# Patient Record
Sex: Female | Born: 1950
Health system: Southern US, Community
[De-identification: ages and names within clinical notes are randomized; demographics above are authoritative.]

## PROBLEM LIST (undated history)

## (undated) DIAGNOSIS — J45909 Unspecified asthma, uncomplicated: Secondary | ICD-10-CM

## (undated) DIAGNOSIS — K922 Gastrointestinal hemorrhage, unspecified: Secondary | ICD-10-CM

## (undated) DIAGNOSIS — K219 Gastro-esophageal reflux disease without esophagitis: Secondary | ICD-10-CM

## (undated) DIAGNOSIS — G43909 Migraine, unspecified, not intractable, without status migrainosus: Secondary | ICD-10-CM

## (undated) DIAGNOSIS — Z853 Personal history of malignant neoplasm of breast: Secondary | ICD-10-CM

## (undated) DIAGNOSIS — D649 Anemia, unspecified: Secondary | ICD-10-CM

## (undated) DIAGNOSIS — K589 Irritable bowel syndrome without diarrhea: Secondary | ICD-10-CM

## (undated) HISTORY — DX: Gastrointestinal hemorrhage, unspecified: K92.2

## (undated) HISTORY — DX: Irritable bowel syndrome, unspecified: K58.9

## (undated) HISTORY — DX: Unspecified asthma, uncomplicated: J45.909

## (undated) HISTORY — DX: Migraine, unspecified, not intractable, without status migrainosus: G43.909

## (undated) HISTORY — PX: BREAST SURGERY: SHX581

## (undated) HISTORY — DX: Personal history of malignant neoplasm of breast: Z85.3

## (undated) HISTORY — DX: Gastro-esophageal reflux disease without esophagitis: K21.9

## (undated) HISTORY — PX: OTHER SURGICAL HISTORY: SHX169

---
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
## 1973-09-14 HISTORY — PX: TUBAL LIGATION: SHX77

## 1979-11-13 DIAGNOSIS — Z853 Personal history of malignant neoplasm of breast: Secondary | ICD-10-CM

## 1979-11-13 HISTORY — PX: ABDOMINAL HYSTERECTOMY: SHX81

## 1979-11-13 HISTORY — PX: MASTECTOMY: SHX3

## 1979-11-13 HISTORY — DX: Personal history of malignant neoplasm of breast: Z85.3

## 2000-06-05 ENCOUNTER — Emergency Department (HOSPITAL_COMMUNITY): Admission: EM | Admit: 2000-06-05 | Discharge: 2000-06-05 | Payer: Self-pay | Admitting: Emergency Medicine

## 2000-10-11 ENCOUNTER — Other Ambulatory Visit: Admission: RE | Admit: 2000-10-11 | Discharge: 2000-10-11 | Payer: Self-pay | Admitting: *Deleted

## 2001-12-13 ENCOUNTER — Emergency Department (HOSPITAL_COMMUNITY): Admission: EM | Admit: 2001-12-13 | Discharge: 2001-12-13 | Payer: Self-pay | Admitting: Emergency Medicine

## 2002-01-03 ENCOUNTER — Emergency Department (HOSPITAL_COMMUNITY): Admission: EM | Admit: 2002-01-03 | Discharge: 2002-01-04 | Payer: Self-pay | Admitting: Emergency Medicine

## 2002-08-25 ENCOUNTER — Other Ambulatory Visit: Admission: RE | Admit: 2002-08-25 | Discharge: 2002-08-25 | Payer: Self-pay | Admitting: Obstetrics and Gynecology

## 2002-09-26 ENCOUNTER — Encounter: Payer: Self-pay | Admitting: Obstetrics and Gynecology

## 2002-09-26 ENCOUNTER — Encounter: Admission: RE | Admit: 2002-09-26 | Discharge: 2002-09-26 | Payer: Self-pay | Admitting: Obstetrics and Gynecology

## 2003-06-12 ENCOUNTER — Encounter: Payer: Self-pay | Admitting: Neurology

## 2003-06-12 ENCOUNTER — Encounter: Admission: RE | Admit: 2003-06-12 | Discharge: 2003-06-12 | Payer: Self-pay | Admitting: Neurology

## 2003-12-31 ENCOUNTER — Encounter: Admission: RE | Admit: 2003-12-31 | Discharge: 2003-12-31 | Payer: Self-pay | Admitting: Obstetrics and Gynecology

## 2005-02-03 ENCOUNTER — Encounter: Admission: RE | Admit: 2005-02-03 | Discharge: 2005-02-03 | Payer: Self-pay | Admitting: Neurology

## 2005-08-20 ENCOUNTER — Emergency Department (HOSPITAL_COMMUNITY): Admission: EM | Admit: 2005-08-20 | Discharge: 2005-08-20 | Payer: Self-pay | Admitting: *Deleted

## 2005-12-14 ENCOUNTER — Encounter: Admission: RE | Admit: 2005-12-14 | Discharge: 2005-12-14 | Payer: Self-pay | Admitting: Neurology

## 2006-04-27 ENCOUNTER — Ambulatory Visit: Payer: Self-pay | Admitting: Internal Medicine

## 2006-06-23 ENCOUNTER — Emergency Department (HOSPITAL_COMMUNITY): Admission: EM | Admit: 2006-06-23 | Discharge: 2006-06-23 | Payer: Self-pay | Admitting: Emergency Medicine

## 2006-10-11 ENCOUNTER — Ambulatory Visit: Payer: Self-pay | Admitting: Internal Medicine

## 2008-08-10 ENCOUNTER — Ambulatory Visit: Payer: Self-pay | Admitting: Internal Medicine

## 2008-08-10 DIAGNOSIS — K589 Irritable bowel syndrome without diarrhea: Secondary | ICD-10-CM

## 2008-08-10 DIAGNOSIS — R519 Headache, unspecified: Secondary | ICD-10-CM | POA: Insufficient documentation

## 2008-08-10 DIAGNOSIS — J45909 Unspecified asthma, uncomplicated: Secondary | ICD-10-CM | POA: Insufficient documentation

## 2008-08-10 DIAGNOSIS — R51 Headache: Secondary | ICD-10-CM

## 2008-08-10 DIAGNOSIS — Z853 Personal history of malignant neoplasm of breast: Secondary | ICD-10-CM | POA: Insufficient documentation

## 2008-08-10 DIAGNOSIS — R1032 Left lower quadrant pain: Secondary | ICD-10-CM | POA: Insufficient documentation

## 2008-08-10 DIAGNOSIS — K219 Gastro-esophageal reflux disease without esophagitis: Secondary | ICD-10-CM

## 2008-08-10 DIAGNOSIS — G43909 Migraine, unspecified, not intractable, without status migrainosus: Secondary | ICD-10-CM | POA: Insufficient documentation

## 2008-08-10 DIAGNOSIS — S62109A Fracture of unspecified carpal bone, unspecified wrist, initial encounter for closed fracture: Secondary | ICD-10-CM | POA: Insufficient documentation

## 2008-08-10 LAB — CONVERTED CEMR LAB
Bilirubin Urine: NEGATIVE
Blood in Urine, dipstick: NEGATIVE
Glucose, Urine, Semiquant: NEGATIVE
Ketones, urine, test strip: NEGATIVE
Nitrite: NEGATIVE
Protein, U semiquant: NEGATIVE
Specific Gravity, Urine: 1.01
Urobilinogen, UA: 0.2
pH: 5

## 2008-08-15 ENCOUNTER — Encounter (INDEPENDENT_AMBULATORY_CARE_PROVIDER_SITE_OTHER): Payer: Self-pay | Admitting: *Deleted

## 2008-08-15 LAB — CONVERTED CEMR LAB
Basophils Absolute: 0.1 10*3/uL (ref 0.0–0.1)
Basophils Relative: 1.2 % (ref 0.0–3.0)
Eosinophils Absolute: 0.2 10*3/uL (ref 0.0–0.7)
Eosinophils Relative: 3 % (ref 0.0–5.0)
HCT: 39.6 % (ref 36.0–46.0)
Hemoglobin: 13.3 g/dL (ref 12.0–15.0)
Lymphocytes Relative: 37.5 % (ref 12.0–46.0)
MCHC: 33.5 g/dL (ref 30.0–36.0)
MCV: 86.4 fL (ref 78.0–100.0)
Monocytes Absolute: 0.6 10*3/uL (ref 0.1–1.0)
Monocytes Relative: 8.3 % (ref 3.0–12.0)
Neutro Abs: 4 10*3/uL (ref 1.4–7.7)
Neutrophils Relative %: 50 % (ref 43.0–77.0)
Platelets: 435 10*3/uL — ABNORMAL HIGH (ref 150–400)
RBC: 4.59 M/uL (ref 3.87–5.11)
RDW: 13 % (ref 11.5–14.6)
WBC: 7.8 10*3/uL (ref 4.5–10.5)

## 2009-07-29 ENCOUNTER — Ambulatory Visit: Payer: Self-pay | Admitting: Internal Medicine

## 2010-02-17 ENCOUNTER — Ambulatory Visit: Payer: Self-pay | Admitting: Internal Medicine

## 2010-02-17 DIAGNOSIS — J45901 Unspecified asthma with (acute) exacerbation: Secondary | ICD-10-CM | POA: Insufficient documentation

## 2010-02-21 ENCOUNTER — Telehealth: Payer: Self-pay | Admitting: Internal Medicine

## 2010-02-23 ENCOUNTER — Telehealth: Payer: Self-pay | Admitting: Internal Medicine

## 2010-03-03 ENCOUNTER — Ambulatory Visit: Payer: Self-pay | Admitting: Internal Medicine

## 2010-04-10 ENCOUNTER — Ambulatory Visit: Payer: Self-pay | Admitting: Internal Medicine

## 2010-04-11 ENCOUNTER — Encounter: Payer: Self-pay | Admitting: Internal Medicine

## 2010-05-02 ENCOUNTER — Telehealth: Payer: Self-pay | Admitting: Internal Medicine

## 2010-05-07 ENCOUNTER — Ambulatory Visit: Payer: Self-pay | Admitting: Internal Medicine

## 2010-05-07 ENCOUNTER — Telehealth: Payer: Self-pay | Admitting: Internal Medicine

## 2010-08-05 ENCOUNTER — Ambulatory Visit: Payer: Self-pay | Admitting: Internal Medicine

## 2010-10-12 LAB — CONVERTED CEMR LAB
Alkaline Phosphatase: 74 units/L (ref 39–117)
BUN: 12 mg/dL (ref 6–23)
Basophils Absolute: 0.1 10*3/uL (ref 0.0–0.1)
Basophils Relative: 1.5 % (ref 0.0–3.0)
Bilirubin, Direct: 0 mg/dL (ref 0.0–0.3)
CO2: 29 meq/L (ref 19–32)
Calcium: 9.8 mg/dL (ref 8.4–10.5)
Cholesterol: 222 mg/dL — ABNORMAL HIGH (ref 0–200)
Creatinine, Ser: 1.1 mg/dL (ref 0.4–1.2)
Direct LDL: 78.3 mg/dL
Eosinophils Absolute: 0.3 10*3/uL (ref 0.0–0.7)
Lymphocytes Relative: 38.6 % (ref 12.0–46.0)
MCHC: 33.9 g/dL (ref 30.0–36.0)
Monocytes Absolute: 0.5 10*3/uL (ref 0.1–1.0)
Neutrophils Relative %: 46.8 % (ref 43.0–77.0)
Platelets: 443 10*3/uL — ABNORMAL HIGH (ref 150.0–400.0)
RBC: 4.44 M/uL (ref 3.87–5.11)
RDW: 12.8 % (ref 11.5–14.6)
Total Bilirubin: 0.5 mg/dL (ref 0.3–1.2)
Total CHOL/HDL Ratio: 3
Triglycerides: 203 mg/dL — ABNORMAL HIGH (ref 0.0–149.0)
VLDL: 40.6 mg/dL — ABNORMAL HIGH (ref 0.0–40.0)
Vit D, 25-Hydroxy: 36 ng/mL (ref 30–89)

## 2010-10-14 NOTE — Progress Notes (Signed)
  Phone Note From Pharmacy   Summary of Call: call from [place] at [facility] about medrol dosepack -- it is no longer availible can he subsitute a sterapred pack which is prednisone 5 mg -- 21 day course ?   Follow-up for Phone Call        ok to substitute that -- will foward this to Dr Yetta Barre  Follow-up by: Judith Part MD,  February 21, 2010 7:57 PM

## 2010-10-14 NOTE — Progress Notes (Signed)
Summary: Xray  Phone Note Call from Patient   Caller: Patient Summary of Call: Patient requests that MD order a chest xray for her because she can not do a PPD. Patient can be contacted at [phone]. Initial call taken by: Daphane Shepherd,  May 02, 2010 3:01 PM  Follow-up for Phone Call        ok to order CXR - v74.1 Follow-up by: Newt Lukes MD,  May 05, 2010 8:33 AM  Additional Follow-up for Phone Call Additional follow up Details #1::        pt informed Additional Follow-up by: Margaret Pyle, CMA,  May 05, 2010 10:40 AM  New Problems: SCREENING, PULMONARY TUBERCULOSIS (ICD-V74.1)   New Problems: SCREENING, PULMONARY TUBERCULOSIS (ICD-V74.1)

## 2010-10-14 NOTE — Assessment & Plan Note (Signed)
Summary: 2 WK ROV /NWS   Vital Signs:  Patient profile:   60 year old female Height:      63.5 inches (161.29 cm) Weight:      155 pounds (70.45 kg) O2 Sat:      93 % on Room air Temp:     98.2 degrees F (36.78 degrees C) oral Pulse rate:   82 / minute BP sitting:   100 / 78  (left arm) Cuff size:   regular  Vitals Entered By: Orlan Leavens (March 03, 2010 3:27 PM)  O2 Flow:  Room air CC: 2 week f/u, shortness of breath Is Patient Diabetic? No Pain Assessment Patient in pain? no        Primary Care Provider:  Newt Lukes MD  CC:  2 week f/u and shortness of breath.  History of Present Illness:       This is a 60 year old female who presents with shortness of breath.  Significant risk factors for shortness of breath include asthma.  The patient notes recent URI.  Patient notes dyspnea with walking on a flat surface.  Cough is described as non-existant.  Wheezing is not problem since resolution of URI.  Breathing is worse with exposure to smoke, exposure to heat and moderate exertion activity. No lower extremity swelling, no pleurisy and no chest pain. no hx PE or DVT.  Preventive Screening-Counseling & Management  Alcohol-Tobacco     Alcohol drinks/day: <1     Alcohol type: WINE     >5/day in last 3 mos: no     Alcohol Counseling: not indicated; use of alcohol is not excessive or problematic     Feels need to cut down: no     Feels annoyed by complaints: no     Feels guilty re: drinking: no     Needs 'eye opener' in am: no     Smoking Status: never     Passive Smoke Counseling: not indicated; no passive smoke exposure  Caffeine-Diet-Exercise     Does Patient Exercise: no  Current Medications (verified): 1)  Vitamin E 400 Unit Caps (Vitamin E) .Marland Kitchen.. 1 By Mouth Once Daily 2)  Epipen 0.3 Mg/0.64ml (1:1000) Devi (Epinephrine Hcl (Anaphylaxis)) .... As Needed 3)  Benadryl 25 Mg Caps (Diphenhydramine Hcl) .Marland Kitchen.. 1 By Mouth Once Daily As Needed 4)  Calcium Lactate 750 Mg  Tabs (Calcium Lactate) .... 3 By Mouth Once Daily 5)  Xopenex Hfa 45 Mcg/act Aero (Levalbuterol Tartrate) .Marland Kitchen.. 1-2 Puffs Qid As Needed 6)  Dilaudid 4 Mg Tabs (Hydromorphone Hcl) .Marland Kitchen.. 1-2 By Mouth Every 6 Hours As Needed For Migraines 7)  Promethazine Hcl 25 Mg Tabs (Promethazine Hcl) .Marland Kitchen.. 1-2 By Mouth Evry 4 Hours As Needed Nausea 8)  Multivitamins  Tabs (Multiple Vitamin) .Marland Kitchen.. 1 By Mouth Once Daily 9)  Dulera 100-5 Mcg/act Aero (Mometasone Furo-Formoterol Fum) .... 2 Puffs Two Times A Day  Allergies (verified): 1)  ! Sulfa 2)  ! Augmentin 3)  ! Betadine 4)  ! Demerol 5)  ! Morphine 6)  ! Fluzone (Influenza Virus Vaccine Split)  Past History:  Past Medical History: Asthma Breast cancer, hx of (11/1979) GERD, hx GIB (07/1992) Headache, migraine hx anaphylaxis  MD roster: pulm - young neuro - adleman, prev lewitt gyn - cousins  Past Surgical History: Mastectomy bilaterally  & implants  11/1979 Hysterectomy 11/1979 (no BSO) for Endometriosis Endoscopy X4: DUD X2 Tubal ligation 1975 Tonsillectomy 1955  (L) Wrist 1963  Social History: Never Smoked  Alcohol use-yes married, lives at home with spouse RN, working with [facility] HH  Review of Systems  The patient denies fever, chest pain, headaches, and hemoptysis.    Physical Exam  General:  alert, well-developed, well-nourished, and cooperative to examination.    Eyes:  vision grossly intact; pupils equal, round and reactive to light.  conjunctiva and lids normal.    Lungs:  normal respiratory effort, no intercostal retractions or use of accessory muscles; normal breath sounds bilaterally - no crackles and no wheezes.    Heart:  normal rate, regular rhythm, no murmur, and no rub. BLE without edema.   Impression & Recommendations:  Problem # 1:  ASTHMA UNSPECIFIED WITH EXACERBATION (ICD-493.92) Assessment Improved  improved since exac of symptoms 6/6 s/p abx and pred pak -  still residual DOE - O2 sat reck 95% on  RA offered CXR, pt declines and lung exam clear - no hx or exam evidence for PE, low risk arrange PFTs to determine best med tx - ?need re-eval pulm  The following medications were removed from the medication list:    Medrol (pak) 4 Mg Tabs (Methylprednisolone) .Marland Kitchen... Take as directed Her updated medication list for this problem includes:    Xopenex Hfa 45 Mcg/act Aero (Levalbuterol tartrate) .Marland Kitchen... 1-2 puffs qid as needed    Dulera 100-5 Mcg/act Aero (Mometasone furo-formoterol fum) .Marland Kitchen... 2 puffs two times a day  Pulmonary Functions Reviewed: O2 sat: 93 (03/03/2010)  Orders: Misc. Referral (Misc. Ref)  Problem # 2:  DYSPNEA ON EXERTION (ICD-786.09)  see above Her updated medication list for this problem includes:    Xopenex Hfa 45 Mcg/act Aero (Levalbuterol tartrate) .Marland Kitchen... 1-2 puffs qid as needed    Dulera 100-5 Mcg/act Aero (Mometasone furo-formoterol fum) .Marland Kitchen... 2 puffs two times a day  Orders: Misc. Referral (Misc. Ref)  Problem # 3:  ASTHMA (ICD-493.90)  see above The following medications were removed from the medication list:    Medrol (pak) 4 Mg Tabs (Methylprednisolone) .Marland Kitchen... Take as directed Her updated medication list for this problem includes:    Xopenex Hfa 45 Mcg/act Aero (Levalbuterol tartrate) .Marland Kitchen... 1-2 puffs qid as needed    Dulera 100-5 Mcg/act Aero (Mometasone furo-formoterol fum) .Marland Kitchen... 2 puffs two times a day  Pulmonary Functions Reviewed: O2 sat: 93 (03/03/2010)  Orders: Misc. Referral (Misc. Ref)  Complete Medication List: 1)  Vitamin E 400 Unit Caps (Vitamin e) .Marland Kitchen.. 1 by mouth once daily 2)  Epipen 0.3 Mg/0.47ml (1:1000) Devi (Epinephrine hcl (anaphylaxis)) .... As needed 3)  Benadryl 25 Mg Caps (Diphenhydramine hcl) .Marland Kitchen.. 1 by mouth once daily as needed 4)  Calcium Lactate 750 Mg Tabs (Calcium lactate) .... 3 by mouth once daily 5)  Xopenex Hfa 45 Mcg/act Aero (Levalbuterol tartrate) .Marland Kitchen.. 1-2 puffs qid as needed 6)  Dilaudid 4 Mg Tabs (Hydromorphone  hcl) .Marland Kitchen.. 1-2 by mouth every 6 hours as needed for migraines 7)  Promethazine Hcl 25 Mg Tabs (Promethazine hcl) .Marland Kitchen.. 1-2 by mouth evry 4 hours as needed nausea 8)  Multivitamins Tabs (Multiple vitamin) .Marland Kitchen.. 1 by mouth once daily 9)  Dulera 100-5 Mcg/act Aero (Mometasone furo-formoterol fum) .... 2 puffs two times a day  Patient Instructions: 1)  it was good to see you today.  2)  we'll make referral for pulmonary function testing to evaluate breathing symptoms . Our office will contact you regarding this appointment once made.  You will then be contacted with these results and plans for treatment after reviewed. 3)  Please  schedule a follow-up appointment as needed.

## 2010-10-14 NOTE — Assessment & Plan Note (Signed)
Summary: COUGH/NWS   Vital Signs:  Patient profile:   60 year old female Menstrual status:  hysterectomy Height:      63.5 inches Weight:      160 pounds BMI:     28.00 O2 Sat:      99 % on Room air Temp:     97.6 degrees F oral Pulse rate:   80 / minute Pulse rhythm:   regular Resp:     16 per minute BP sitting:   120 / 78  (left arm) Cuff size:   large  Vitals Entered By: Rock Nephew CMA (August 05, 2010 10:27 AM)  Nutrition Counseling: Patient's BMI is greater than 25 and therefore counseled on weight management options.  O2 Flow:  Room air CC: Patient c/o non productive cough, Cough Is Patient Diabetic? No Pain Assessment Patient in pain? no          Menstrual Status hysterectomy   Primary Care Provider:  Newt Lukes MD  CC:  Patient c/o non productive cough and Cough.  History of Present Illness:  Cough      This is a 60 year old woman who presents with Cough.  The symptoms began 3 days ago.  The intensity is described as mild.  The patient reports non-productive cough, shortness of breath, and wheezing, but denies productive cough, pleuritic chest pain, exertional dyspnea, fever, hemoptysis, and malaise.  Associated symtpoms include cold/URI symptoms.  The patient denies the following symptoms: sore throat, nasal congestion, chronic rhinitis, weight loss, acid reflux symptoms, and peripheral edema.  The cough is worse with exercise and cold exposure.  Ineffective prior treatments have included albuterol inhaler and other asthma medication.  Risk factors include history of asthma.    Preventive Screening-Counseling & Management  Alcohol-Tobacco     Alcohol drinks/day: <1     Alcohol type: WINE     >5/day in last 3 mos: no     Alcohol Counseling: not indicated; use of alcohol is not excessive or problematic     Feels need to cut down: no     Feels annoyed by complaints: no     Feels guilty re: drinking: no     Needs 'eye opener' in am: no  Smoking Status: never     Tobacco Counseling: not indicated; no tobacco use     Passive Smoke Counseling: not indicated; no passive smoke exposure  Hep-HIV-STD-Contraception     Hepatitis Risk: no risk noted     HIV Risk: no risk noted     STD Risk: no risk noted      Sexual History:  currently monogamous.        Drug Use:  never.        Blood Transfusions:  no.    Medications Prior to Update: 1)  Vitamin E 400 Unit Caps (Vitamin E) .Marland Kitchen.. 1 By Mouth Once Daily 2)  Epipen 0.3 Mg/0.83ml (1:1000) Devi (Epinephrine Hcl (Anaphylaxis)) .... As Needed 3)  Benadryl 25 Mg Caps (Diphenhydramine Hcl) .Marland Kitchen.. 1 By Mouth Once Daily As Needed 4)  Calcium Lactate 750 Mg Tabs (Calcium Lactate) .... 3 By Mouth Once Daily 5)  Xopenex Hfa 45 Mcg/act Aero (Levalbuterol Tartrate) .Marland Kitchen.. 1-2 Puffs Qid As Needed 6)  Dilaudid 4 Mg Tabs (Hydromorphone Hcl) .Marland Kitchen.. 1-2 By Mouth Every 6 Hours As Needed For Migraines 7)  Promethazine Hcl 25 Mg Tabs (Promethazine Hcl) .Marland Kitchen.. 1-2 By Mouth Evry 4 Hours As Needed Nausea 8)  Multivitamins  Tabs (Multiple Vitamin) .Marland KitchenMarland KitchenMarland Kitchen  1 By Mouth Once Daily 9)  Dulera 100-5 Mcg/act Aero (Mometasone Furo-Formoterol Fum) .... 2 Puffs Two Times A Day  Current Medications (verified): 1)  Vitamin E 400 Unit Caps (Vitamin E) .Marland Kitchen.. 1 By Mouth Once Daily 2)  Epipen 0.3 Mg/0.69ml (1:1000) Devi (Epinephrine Hcl (Anaphylaxis)) .... As Needed 3)  Benadryl 25 Mg Caps (Diphenhydramine Hcl) .Marland Kitchen.. 1 By Mouth Once Daily As Needed 4)  Calcium Lactate 750 Mg Tabs (Calcium Lactate) .... 3 By Mouth Once Daily 5)  Xopenex Hfa 45 Mcg/act Aero (Levalbuterol Tartrate) .Marland Kitchen.. 1-2 Puffs Qid As Needed 6)  Dilaudid 4 Mg Tabs (Hydromorphone Hcl) .Marland Kitchen.. 1-2 By Mouth Every 6 Hours As Needed For Migraines 7)  Promethazine Hcl 25 Mg Tabs (Promethazine Hcl) .Marland Kitchen.. 1-2 By Mouth Evry 4 Hours As Needed Nausea 8)  Multivitamins  Tabs (Multiple Vitamin) .Marland Kitchen.. 1 By Mouth Once Daily 9)  Dulera 100-5 Mcg/act Aero (Mometasone Furo-Formoterol Fum)  .... 2 Puffs Two Times A Day 10)  Medrol (Pak) 4 Mg Tabs (Methylprednisolone) .... Take As Directed 11)  Mytussin Ac 100-10 Mg/65ml Syrp (Guaifenesin-Codeine) .... 5-10 Ml By Mouth Qid As Needed For Cough  Allergies (verified): 1)  ! Sulfa 2)  ! Augmentin 3)  ! Betadine 4)  ! Demerol 5)  ! Morphine 6)  ! Fluzone (Influenza Virus Vaccine Split)  Past History:  Past Medical History: Last updated: 03/03/2010 Asthma Breast cancer, hx of (11/1979) GERD, hx GIB (07/1992) Headache, migraine hx anaphylaxis  MD roster: pulm - young neuro - adleman, prev lewitt gyn - cousins  Past Surgical History: Last updated: 03/03/2010 Mastectomy bilaterally  & implants  11/1979 Hysterectomy 11/1979 (no BSO) for Endometriosis Endoscopy X4: DUD X2 Tubal ligation 1975 Tonsillectomy 1955  (L) Wrist 1963  Family History: Last updated: 08/10/2008 Father: unknown ; P uncle LV rupture Mother: breast & uterine CA,CAD Siblings: 1/2 sibs FC breast disease ,Endometriosis  Social History: Last updated: 03/03/2010 Never Smoked  Alcohol use-yes married, lives at home with spouse RN, working with [facility]  Risk Factors: Alcohol Use: <1 (08/05/2010) >5 drinks/d w/in last 3 months: no (08/05/2010) Exercise: no (03/03/2010)  Risk Factors: Smoking Status: never (08/05/2010)  Family History: Reviewed history from 08/10/2008 and no changes required. Father: unknown ; P uncle LV rupture Mother: breast & uterine CA,CAD Siblings: 1/2 sibs FC breast disease ,Endometriosis  Social History: Reviewed history from 03/03/2010 and no changes required. 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  The patient denies anorexia, fever,  weight loss, weight gain, decreased hearing, chest pain, syncope, dyspnea on exertion, peripheral edema, headaches, hemoptysis, abdominal pain, hematuria, suspicious skin lesions, enlarged lymph nodes, and angioedema.   Resp:  Complains of shortness of breath and wheezing; denies chest discomfort, chest pain with inspiration, coughing up blood, excessive snoring, pleuritic, and sputum productive.  Physical Exam  General:  alert, well-developed, well-nourished, and cooperative to examination.    Head:  normocephalic, atraumatic, no abnormalities observed, and no abnormalities palpated.   Ears:  R ear normal and L ear normal.   Nose:  External nasal examination shows no deformity or inflammation. Nasal mucosa are pink and moist without lesions or exudates. Mouth:  Oral mucosa  and oropharynx without lesions or exudates.  Teeth in good repair. Neck:  supple, full ROM, no masses, no thyromegaly, no thyroid nodules or tenderness, no JVD, normal carotid upstroke, and no carotid bruits.   Lungs:  normal respiratory effort, no intercostal retractions, no accessory muscle use, normal breath sounds, no dullness, no fremitus, no crackles, and no wheezes.   Heart:  normal rate, regular rhythm, no murmur, no gallop, no rub, and no JVD.   Abdomen:  soft, non-tender, normal bowel sounds, no distention, no masses, no guarding, no rigidity, no rebound tenderness, no hepatomegaly, and no splenomegaly.   Msk:  No deformity or scoliosis noted of thoracic or lumbar spine.   Pulses:  R and L carotid,radial,femoral,dorsalis pedis and posterior tibial pulses are full and equal bilaterally Extremities:  No clubbing, cyanosis, edema, or deformity noted with normal full range of motion of all joints.   Neurologic:  No cranial nerve deficits noted. Station and gait are normal. Plantar reflexes are down-going bilaterally. DTRs are symmetrical throughout. Sensory, motor and coordinative functions appear intact. Skin:  no rashes,  vesicles, ulcers, or erythema. No nodules or irregularity to palpation.  Cervical Nodes:  no anterior cervical adenopathy and no posterior cervical adenopathy.   Axillary Nodes:  no R axillary adenopathy and no L axillary adenopathy.   Psych:  Oriented X3, memory intact for recent and remote, normally interactive, good eye contact, not anxious appearing, not depressed appearing, and not agitated.      Impression & Recommendations:  Problem # 1:  ASTHMA UNSPECIFIED WITH EXACERBATION (UEA-540.98) Assessment Deteriorated  Her updated medication list for this problem includes:    Xopenex Hfa 45 Mcg/act Aero (Levalbuterol tartrate) .Marland Kitchen... 1-2 puffs qid as needed    Dulera 100-5 Mcg/act Aero (Mometasone furo-formoterol fum) .Marland Kitchen... 2 puffs two times a day    Medrol (pak) 4 Mg Tabs (Methylprednisolone) .Marland Kitchen... Take as directed  Pulmonary Functions Reviewed: O2 sat: 99 (08/05/2010)  Complete Medication List: 1)  Vitamin E 400 Unit Caps (Vitamin e) .Marland Kitchen.. 1 by mouth once daily 2)  Epipen 0.3 Mg/0.52ml (1:1000) Devi (Epinephrine hcl (anaphylaxis)) .... As needed 3)  Benadryl 25 Mg Caps (Diphenhydramine hcl) .Marland Kitchen.. 1 by mouth once daily as needed 4)  Calcium Lactate 750 Mg Tabs (Calcium lactate) .... 3 by mouth once daily 5)  Xopenex Hfa 45 Mcg/act Aero (Levalbuterol tartrate) .Marland Kitchen.. 1-2 puffs qid as needed 6)  Dilaudid 4 Mg Tabs (Hydromorphone hcl) .Marland Kitchen.. 1-2 by mouth every 6 hours as needed for migraines 7)  Promethazine Hcl 25 Mg Tabs (Promethazine hcl) .Marland Kitchen.. 1-2 by mouth evry 4 hours as needed nausea 8)  Multivitamins Tabs (Multiple vitamin) .Marland Kitchen.. 1 by mouth once daily 9)  Dulera 100-5 Mcg/act Aero (Mometasone furo-formoterol fum) .... 2 puffs two times a day 10)  Medrol (pak) 4 Mg Tabs (Methylprednisolone) .... Take as directed 11)  Mytussin Ac 100-10 Mg/9ml Syrp (Guaifenesin-codeine) .... 5-10 ml by mouth qid as needed for cough  Patient Instructions: 1)  Please schedule a follow-up appointment in 2  weeks. Prescriptions: MYTUSSIN AC 100-10 MG/5ML SYRP (GUAIFENESIN-CODEINE) 5-10 ml by mouth QID as needed for cough  #8 ounces x 0   Entered and Authorized by:   Etta Grandchild MD   Signed by:   Etta Grandchild MD on 08/05/2010   Method used:   Print then Give to Patient   RxID:   [id number] MEDROL (PAK) 4 MG TABS (METHYLPREDNISOLONE) take as directed  #1 x 0   Entered and  Authorized by:   Etta Grandchild MD   Signed by:   Etta Grandchild MD on 08/05/2010   Method used:   Print then Give to Patient   RxID:   [id number] DULERA 100-5 MCG/ACT AERO (MOMETASONE FURO-FORMOTEROL FUM) 2 puffs two times a day  #6 inhs x 0   Entered and Authorized by:   Etta Grandchild MD   Signed by:   Etta Grandchild MD on 08/05/2010   Method used:   Samples Given   RxID:   [id number]    Orders Added: 1)  Est. Patient Level IV [84696]

## 2010-10-14 NOTE — Miscellaneous (Signed)
Summary: Orders Update pft charges  Clinical Lists Changes  Orders: Added new Service order of Carbon Monoxide diffusing w/capacity (94720) - Signed Added new Service order of Lung Volumes (94240) - Signed Added new Service order of Spirometry (Pre & Post) (94060) - Signed 

## 2010-10-14 NOTE — Assessment & Plan Note (Signed)
Summary: COUGH/CONGESTION/NWS  VAL'S PT   Vital Signs:  Patient profile:   60 year old female Height:      63.5 inches Weight:      155.50 pounds O2 Sat:      98 % on Room air Temp:     98.5 degrees F oral Pulse rate:   121 / minute Pulse rhythm:   regular Resp:     20 per minute BP sitting:   116 / 80  (left arm) Cuff size:   large  Vitals Entered By: Rock Nephew CMA (February 17, 2010 3:27 PM)  O2 Flow:  Room air CC: cough, congestion x 02/13/10, URI symptoms Is Patient Diabetic? No   Primary Care Provider:  Newt Lukes MD  CC:  cough, congestion x 02/13/10, and URI symptoms.  History of Present Illness:  URI Symptoms      This is a 60 year old woman who presents with URI symptoms.  The symptoms began 4 days ago.  The severity is described as moderate.  The patient reports purulent nasal discharge and productive cough, but denies nasal congestion, clear nasal discharge, sore throat, dry cough, earache, and sick contacts.  Associated symptoms include wheezing.  The patient denies fever, stiff neck, dyspnea, rash, vomiting, diarrhea, use of an antipyretic, and response to antipyretic.  The patient denies headache, muscle aches, and severe fatigue.  The patient denies the following risk factors for Strep sinusitis: unilateral facial pain, unilateral nasal discharge, poor response to decongestant, double sickening, Strep exposure, tender adenopathy, and absence of cough.    Preventive Screening-Counseling & Management  Alcohol-Tobacco     Alcohol drinks/day: <1     Alcohol type: WINE     >5/day in last 3 mos: no     Alcohol Counseling: not indicated; use of alcohol is not excessive or problematic     Feels need to cut down: no     Feels annoyed by complaints: no     Feels guilty re: drinking: no     Needs 'eye opener' in am: no     Smoking Status: never  Medications Prior to Update: 1)  Vitamin E 400 Unit Caps (Vitamin E) .Marland Kitchen.. 1 By Mouth Once Daily 2)  Epipen 0.3  Mg/0.85ml (1:1000) Devi (Epinephrine Hcl (Anaphylaxis)) .... As Needed 3)  Benadryl 25 Mg Caps (Diphenhydramine Hcl) .Marland Kitchen.. 1 By Mouth Once Daily As Needed 4)  Calcium Lactate 750 Mg Tabs (Calcium Lactate) .... 3 By Mouth Once Daily 5)  Xopenex Hfa 45 Mcg/act Aero (Levalbuterol Tartrate) .Marland Kitchen.. 1-2 Puffs Qid As Needed 6)  Dilaudid 4 Mg Tabs (Hydromorphone Hcl) .Marland Kitchen.. 1-2 By Mouth Every 6 Hours As Needed For Migraines 7)  Promethazine Hcl 25 Mg Tabs (Promethazine Hcl) .Marland Kitchen.. 1-2 By Mouth Evry 4 Hours As Needed Nausea 8)  Multivitamins  Tabs (Multiple Vitamin) .Marland Kitchen.. 1 By Mouth Once Daily  Current Medications (verified): 1)  Vitamin E 400 Unit Caps (Vitamin E) .Marland Kitchen.. 1 By Mouth Once Daily 2)  Epipen 0.3 Mg/0.54ml (1:1000) Devi (Epinephrine Hcl (Anaphylaxis)) .... As Needed 3)  Benadryl 25 Mg Caps (Diphenhydramine Hcl) .Marland Kitchen.. 1 By Mouth Once Daily As Needed 4)  Calcium Lactate 750 Mg Tabs (Calcium Lactate) .... 3 By Mouth Once Daily 5)  Xopenex Hfa 45 Mcg/act Aero (Levalbuterol Tartrate) .Marland Kitchen.. 1-2 Puffs Qid As Needed 6)  Dilaudid 4 Mg Tabs (Hydromorphone Hcl) .Marland Kitchen.. 1-2 By Mouth Every 6 Hours As Needed For Migraines 7)  Promethazine Hcl 25 Mg Tabs (Promethazine Hcl) .Marland KitchenMarland KitchenMarland Kitchen  1-2 By Mouth Evry 4 Hours As Needed Nausea 8)  Multivitamins  Tabs (Multiple Vitamin) .Marland Kitchen.. 1 By Mouth Once Daily 9)  Dulera 100-5 Mcg/act Aero (Mometasone Furo-Formoterol Fum) .... 2 Puffs Two Times A Day 10)  Zithromax Tri-Pak 500 Mg Tab (Azithromycin) .... Take As Directed One By Mouth Once Daily For 3 Days 11)  Mytussin Ac 100-10 Mg/55ml Syrp (Guaifenesin-Codeine) .... 5-10 Ml By Mouth Qid As Needed For Cough  Allergies (verified): 1)  ! Sulfa 2)  ! Augmentin 3)  ! Betadine 4)  ! Demerol 5)  ! Morphine 6)  ! Fluzone (Influenza Virus Vaccine Split)  Past History:  Past Medical History: Last updated: 07/29/2009 Asthma Breast cancer, hx of (11/1979) GERD, hx GIB (07/1992) Headache, migraine hx anaphylaxis  Past Surgical History: Last  updated: 07/29/2009 Mastectomy bilaterally  & implants  11/1979 Hysterectomy 11/1979 (no BSO) for Endometriosis Endoscopy X4: DUD X2 Tubal ligation 1975 Tonsillectomy 1955 (L) Wrist 1963  Family History: Last updated: 08/10/2008 Father: unknown ; P uncle LV rupture Mother: breast & uterine CA,CAD Siblings: 1/2 sibs FC breast disease ,Endometriosis  Social History: Last updated: 07/29/2009 Never Smoked Alcohol use-yes married, lives at home with spouse RN, working with [facility]  Risk Factors: Alcohol Use: <1 (02/17/2010) >5 drinks/d w/in last 3 months: no (02/17/2010) Exercise: no (08/10/2008)  Risk Factors: Smoking Status: never (02/17/2010)  Family History: Reviewed history from 08/10/2008 and no changes required. Father: unknown ; P uncle LV rupture Mother: breast & uterine CA,CAD Siblings: 1/2 sibs FC breast disease ,Endometriosis  Social History: Reviewed history from 07/29/2009 and no changes required. Never Smoked Alcohol use-yes married, lives at home with spouse RN, working with [employer] [employer] [facility]  Review of Systems       The patient complains of hoarseness.  The patient denies anorexia, fever, weight loss, decreased hearing, chest pain, syncope, dyspnea on exertion, peripheral edema, headaches, hemoptysis, abdominal pain, suspicious skin lesions, enlarged lymph nodes, and angioedema.   Resp:  Complains of cough, shortness of breath, sputum productive, and wheezing; denies chest discomfort, chest pain with inspiration, coughing up blood, excessive snoring, hypersomnolence, morning headaches, and pleuritic.  Physical Exam  General:  alert, well-developed, well-nourished, and cooperative to examination.    Head:  normocephalic, atraumatic, no abnormalities observed, and no abnormalities palpated.   Ears:  R ear normal and L ear normal.   Nose:  External nasal examination shows no deformity or inflammation. Nasal mucosa are pink and moist without lesions or  exudates. Mouth:  Oral mucosa and oropharynx without lesions or exudates.  Teeth in good repair. Neck:  supple, full ROM, no masses, no thyromegaly, no thyroid nodules or tenderness, no JVD, normal carotid upstroke, and no carotid bruits.   Lungs:  She has diffuse, bialteral late exp. wheezes. normal respiratory effort, no intercostal retractions, no accessory muscle use, no dullness, no fremitus, and no crackles.   Heart:  normal rate, regular rhythm, no murmur, no gallop, no rub, and no JVD.   Abdomen:  soft, non-tender, normal bowel sounds, no distention, no masses, no guarding, no rigidity, no rebound tenderness, no hepatomegaly, and no splenomegaly.   Msk:  No deformity or scoliosis noted of thoracic or lumbar spine.   Pulses:  R and L carotid,radial,femoral,dorsalis pedis and posterior tibial pulses are full and equal bilaterally Extremities:  No clubbing, cyanosis, edema, or deformity noted with normal full range of motion of all joints.   Neurologic:  No cranial nerve deficits noted. Station and gait are normal.  Plantar reflexes are down-going bilaterally. DTRs are symmetrical throughout. Sensory, motor and coordinative functions appear intact. Skin:  no rashes, vesicles, ulcers, or erythema. No nodules or irregularity to palpation.  Cervical Nodes:  no anterior cervical adenopathy and no posterior cervical adenopathy.   Axillary Nodes:  no R axillary adenopathy and no L axillary adenopathy.   Psych:  Oriented X3, memory intact for recent and remote, normally interactive, good eye contact, not anxious appearing, not depressed appearing, and not agitated.    Additional Exam:  She reveived a jet neb treatment with Xopenex 1.25 mg and afterwards her lungs were CTA bilaterally.   Impression & Recommendations:  Problem # 1:  ASTHMA UNSPECIFIED WITH EXACERBATION (ICD-493.92) Assessment New  I asked to give systemic steroids but she refused and said that she did not want to risk the side  effects Her updated medication list for this problem includes:    Xopenex Hfa 45 Mcg/act Aero (Levalbuterol tartrate) .Marland Kitchen... 1-2 puffs qid as needed    Dulera 100-5 Mcg/act Aero (Mometasone furo-formoterol fum) .Marland Kitchen... 2 puffs two times a day  Pulmonary Functions Reviewed: O2 sat: 98 (02/17/2010)  Orders: Nebulizer Tx (16109)  Problem # 2:  BRONCHITIS-ACUTE (ICD-466.0) Assessment: New  Her updated medication list for this problem includes:    Xopenex Hfa 45 Mcg/act Aero (Levalbuterol tartrate) .Marland Kitchen... 1-2 puffs qid as needed    Dulera 100-5 Mcg/act Aero (Mometasone furo-formoterol fum) .Marland Kitchen... 2 puffs two times a day    Zithromax Tri-pak 500 Mg Tab (Azithromycin) .Marland Kitchen... Take as directed one by mouth once daily for 3 days    Mytussin Ac 100-10 Mg/36ml Syrp (Guaifenesin-codeine) .Marland Kitchen... 5-10 ml by mouth qid as needed for cough  Orders: Nebulizer Tx (60454)  Complete Medication List: 1)  Vitamin E 400 Unit Caps (Vitamin e) .Marland Kitchen.. 1 by mouth once daily 2)  Epipen 0.3 Mg/0.84ml (1:1000) Devi (Epinephrine hcl (anaphylaxis)) .... As needed 3)  Benadryl 25 Mg Caps (Diphenhydramine hcl) .Marland Kitchen.. 1 by mouth once daily as needed 4)  Calcium Lactate 750 Mg Tabs (Calcium lactate) .... 3 by mouth once daily 5)  Xopenex Hfa 45 Mcg/act Aero (Levalbuterol tartrate) .Marland Kitchen.. 1-2 puffs qid as needed 6)  Dilaudid 4 Mg Tabs (Hydromorphone hcl) .Marland Kitchen.. 1-2 by mouth every 6 hours as needed for migraines 7)  Promethazine Hcl 25 Mg Tabs (Promethazine hcl) .Marland Kitchen.. 1-2 by mouth evry 4 hours as needed nausea 8)  Multivitamins Tabs (Multiple vitamin) .Marland Kitchen.. 1 by mouth once daily 9)  Dulera 100-5 Mcg/act Aero (Mometasone furo-formoterol fum) .... 2 puffs two times a day 10)  Zithromax Tri-pak 500 Mg Tab (Azithromycin) .... Take as directed one by mouth once daily for 3 days 11)  Mytussin Ac 100-10 Mg/72ml Syrp (Guaifenesin-codeine) .... 5-10 ml by mouth qid as needed for cough  Patient Instructions: 1)  Please schedule a follow-up  appointment in 2 weeks. 2)  It is important to use your inhaler properly. Use a spacer, take slow deep breaths and hold them. Rinse your mouth after using.  3)  Take your antibiotic as prescribed until ALL of it is gone, but stop if you develop a rash or swelling and contact our office as soon as possible. 4)  Acute bronchitis symptoms for less than 10 days are not helped by antibiotics. take over the counter cough medications. call if no improvment in  5-7 days, sooner if increasing cough, fever, or new symptoms( shortness of breath, chest pain). Prescriptions: MYTUSSIN AC 100-10 MG/5ML SYRP (GUAIFENESIN-CODEINE) 5-10 ml by  mouth QID as needed for cough  #6 ounces x 1   Entered and Authorized by:   Etta Grandchild MD   Signed by:   Etta Grandchild MD on 02/17/2010   Method used:   Print then Give to Patient   RxID:   [id number] ZITHROMAX TRI-PAK 500 MG TAB (AZITHROMYCIN) Take as directed one by mouth once daily for 3 days  #3 x 0   Entered and Authorized by:   Etta Grandchild MD   Signed by:   Etta Grandchild MD on 02/17/2010   Method used:   Print then Give to Patient   RxID:   [id number] DULERA 100-5 MCG/ACT AERO (MOMETASONE FURO-FORMOTEROL FUM) 2 puffs two times a day  #5 inhs x 0   Entered and Authorized by:   Etta Grandchild MD   Signed by:   Etta Grandchild MD on 02/17/2010   Method used:   Samples Given   RxID:   [id number]

## 2010-10-14 NOTE — Progress Notes (Signed)
Summary: rx?   Phone Note Call from Patient Call back at [facility] Phone [phone]   Summary of Call: Pt says she "does not feel much better" and has completed antibiotic. Does she need another rx?  Initial call taken by: Lamar Sprinkles, CMA,  February 21, 2010 11:35 AM  Follow-up for Phone Call        will she take steroids? Follow-up by: Etta Grandchild MD,  February 21, 2010 11:57 AM  Additional Follow-up for Phone Call Additional follow up Details #1::        Pt would like rx for steriods, please send to pharm in EMR.  Additional Follow-up by: Lamar Sprinkles, CMA,  February 21, 2010 3:01 PM    New/Updated Medications: MEDROL (PAK) 4 MG TABS (METHYLPREDNISOLONE) Take as directed Prescriptions: MEDROL (PAK) 4 MG TABS (METHYLPREDNISOLONE) Take as directed  #1 x 0   Entered and Authorized by:   Etta Grandchild MD   Signed by:   Etta Grandchild MD on 02/21/2010   Method used:   Electronically to        [facility] [address] [phone]* (retail)       [address]       [place], [place]  [address]       Ph: [phone] or [phone]       Fax: [phone]   RxID:   [id number]

## 2011-01-19 DIAGNOSIS — D473 Essential (hemorrhagic) thrombocythemia: Secondary | ICD-10-CM | POA: Insufficient documentation

## 2011-01-30 NOTE — Assessment & Plan Note (Signed)
[facility]                             PULMONARY OFFICE NOTE   NAME:[name], [name] BECHLER                      MRN:          [id number]  DATE:10/11/2006                            DOB:          1951-06-02    HISTORY OF PRESENT ILLNESS:  Patient is a 60 year old white female  patient of Dr. Roxy Cedar who has a known history of allergic rhinitis with  multiple drug intolerances in the past with anaphylaxis by history.  She  presents for an acute office visit, complaining of a three week history  of nasal congestion, cough, and congestion.  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  Patient denies any hemoptysis,  orthopnea, PND, or leg swelling.   PAST MEDICAL HISTORY:  Reviewed.   CURRENT MEDICATIONS:  Reviewed.   PHYSICAL EXAMINATION:  GENERAL:  Patient is a pleasant female in no  acute distress.  VITAL SIGNS:  She is afebrile with stable vital signs.  Her O2  saturation on recheck is 98% on room air.  HEENT:  Nasopharynx with some mild erythema.  Nontender sinuses.  NECK:  Neck is supple without adenopathy.  LUNGS:  Lung sounds reveal coarse breath sounds bilaterally.  CARDIAC:  Regular rate.  ABDOMEN:  Soft and benign.  EXTREMITIES:  Warm without any edema.   IMPRESSION/PLAN:  Acute tracheal bronchitis.  Patient is to begin Biaxin  XL pack.  Mucinex DM twice daily.  Phenergan VC with codeine #8 ounces,  1-2 teaspoons every 4-6 hours as needed for cough.  Patient is to return  back with Dr. Maple Hudson as scheduled or sooner if needed.      Rubye Oaks, NP  Electronically Signed      Clinton D. Maple Hudson, MD, Tonny Bollman, FACP  Electronically Signed   TP/MedQ  DD: 10/12/2006  DT: 10/12/2006  Job #: [id number]

## 2011-01-30 NOTE — Assessment & Plan Note (Signed)
[facility]                               PULMONARY OFFICE NOTE   NAME:[name] [name], [name] [name]                      MRN:          [id number]  DATE:04/28/2006                            DOB:          July 25, 1951    PROBLEM:  Anaphylaxis/multiple-drug intolerance.   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  She comes now for continuity to establish at this practice.  She had had a  history in the early 1990s of anaphylactic reaction to sulfa which appears  generalized, also, to include bisulfites, as well as sulfa-based  antibiotics.  She has also described reactions to a variety of medications  including Mucomyst.  She has had several emergency room treatments and had a  near-respiratory arrest while at work within the past year, treated by one  of the hospitalist physicians as a first responder.  She has also had a  history of seasonal allergic rhinitis and allergic conjunctivitis without  significant asthma.  She has not recognized problems with exposure to dust,  molds, animals, or cosmetics.   PAST HISTORY:  Significant for cellulitis, gastrointestinal bleed on two  occasions, bilateral mastectomy for cancer, hysterectomy, tubal ligation,  tonsils and adenoids out.   There is a family history of several said to be allergic to sulfa and a  grandchild with asthma.   She describes one occasion when she got morphine from a the patient's IV  into a paper cut on her hand, triggering angioedema and wheezing.   CURRENT MEDICATIONS:  1. Keppra 500 mg x 3 daily for migraine prophylaxis.  2. Vivactil 10 mg daily for migraine prophylaxis.  3. Aciphex.  4. Multivitamins.  5. EpiPen.  6. Benadryl.  7. Xopenex HFA metered inhaler for p.r.n. use.  8. Calcium with vitamin D.  9. Aspirin 81 mg daily.  10.Norflex 100 mg b.i.d. p.r.n.   She has a living  will and healthcare power of attorney.   OBJECTIVE:  VITAL SIGNS:  Weight 141 pounds.  BP 118/84, pulse regular at  94.  Room air saturation 98%.  GENERAL:  She is an alert, apparently comfortable-appearing woman.  There  are pink thumb print-sized blotches bilaterally on her neck which seem to  fade a little and regrow as we talk.  Palms are not red which she says is  the usual earliest trigger of trouble.  HEENT:  Eyes, nose, and throat are clear.  LUNGS:  Clear to P&A.  HEART:  Heart sounds are regular without murmur or gallop.  ABDOMEN:  I do not feel liver or spleen.  EXTREMITIES:  There is no clubbing, cyanosis or edema.   IMPRESSION:  1. Multiple-drug intolerance with anaphylaxis by history.  2. Allergic rhinitis.   PLAN:  1. Continue to follow careful avoidance.  2. Epinephrine injector Twinject.  Refill p.r.n. to keep available.  3. Schedule return one-year followup, earlier p.r.n.  Clinton D. Maple Hudson, MD, FCCP, FACP   CDY/MedQ  DD:  04/28/2006  DT:  04/28/2006  Job #:  [id number]   cc:   Titus Dubin. Alwyn Ren, MD, FACP, [facility]

## 2011-02-11 ENCOUNTER — Encounter: Payer: Self-pay | Admitting: Internal Medicine

## 2011-02-11 ENCOUNTER — Other Ambulatory Visit (INDEPENDENT_AMBULATORY_CARE_PROVIDER_SITE_OTHER): Payer: BC Managed Care – PPO

## 2011-02-11 ENCOUNTER — Ambulatory Visit (INDEPENDENT_AMBULATORY_CARE_PROVIDER_SITE_OTHER): Payer: BC Managed Care – PPO | Admitting: Internal Medicine

## 2011-02-11 DIAGNOSIS — G4700 Insomnia, unspecified: Secondary | ICD-10-CM

## 2011-02-11 DIAGNOSIS — R1032 Left lower quadrant pain: Secondary | ICD-10-CM

## 2011-02-11 DIAGNOSIS — R197 Diarrhea, unspecified: Secondary | ICD-10-CM

## 2011-02-11 LAB — CBC WITH DIFFERENTIAL/PLATELET
Basophils Absolute: 0.1 10*3/uL (ref 0.0–0.1)
Basophils Relative: 0.9 % (ref 0.0–3.0)
Eosinophils Absolute: 0.2 10*3/uL (ref 0.0–0.7)
Eosinophils Relative: 2.2 % (ref 0.0–5.0)
HCT: 34.9 % — ABNORMAL LOW (ref 36.0–46.0)
Hemoglobin: 11.7 g/dL — ABNORMAL LOW (ref 12.0–15.0)
Lymphocytes Relative: 30.7 % (ref 12.0–46.0)
Lymphs Abs: 2.5 10*3/uL (ref 0.7–4.0)
MCHC: 33.6 g/dL (ref 30.0–36.0)
MCV: 81.6 fl (ref 78.0–100.0)
Monocytes Absolute: 0.5 10*3/uL (ref 0.1–1.0)
Monocytes Relative: 6.6 % (ref 3.0–12.0)
Neutro Abs: 4.9 10*3/uL (ref 1.4–7.7)
Neutrophils Relative %: 59.6 % (ref 43.0–77.0)
Platelets: 515 10*3/uL — ABNORMAL HIGH (ref 150.0–400.0)
RBC: 4.28 Mil/uL (ref 3.87–5.11)
RDW: 14.4 % (ref 11.5–14.6)
WBC: 8.2 10*3/uL (ref 4.5–10.5)

## 2011-02-11 LAB — HEPATIC FUNCTION PANEL
Albumin: 4.1 g/dL (ref 3.5–5.2)
Alkaline Phosphatase: 61 U/L (ref 39–117)
Bilirubin, Direct: 0 mg/dL (ref 0.0–0.3)
Total Protein: 7.1 g/dL (ref 6.0–8.3)

## 2011-02-11 LAB — BASIC METABOLIC PANEL
BUN: 13 mg/dL (ref 6–23)
CO2: 27 mEq/L (ref 19–32)
Chloride: 102 mEq/L (ref 96–112)
Potassium: 4.3 mEq/L (ref 3.5–5.1)

## 2011-02-11 MED ORDER — ZOLPIDEM TARTRATE 10 MG PO TABS: 10.0000 mg | ORAL_TABLET | Freq: Every evening | ORAL | Status: DC | PRN

## 2011-02-11 MED ORDER — PROMETHAZINE HCL 25 MG PO TABS: 25.0000 mg | ORAL_TABLET | Freq: Four times a day (QID) | ORAL | Status: DC | PRN

## 2011-02-11 MED ORDER — HYOSCYAMINE SULFATE ER 0.375 MG PO TB12: 0.3750 mg | ORAL_TABLET | Freq: Two times a day (BID) | ORAL | Status: DC | PRN

## 2011-02-11 NOTE — Patient Instructions (Signed)
It was good to see you today. Test(s) ordered today. Your results will be called to you after review (48-72hours after test completion). If any changes need to be made, you will be notified at that time. Phenergan and Levbid for nausea and cramping - If you develop worsening symptoms or fever, call and we can reconsider antibiotics, but it does not appear necessary to use antibiotics at this time. Ambien for sleep as needed Your prescription(s) have been submitted to your pharmacy. Please take as directed and contact our office if you believe you are having problem(s) with the medication(s). Please schedule followup in 6 months for physical/labs, etc; call sooner if problems.

## 2011-02-11 NOTE — Progress Notes (Signed)
  Subjective:    Patient ID: [name] [name], female    DOB: [birth year], 60 y.o.   MRN: [id number]  HPI  complains of abdominal pain Onset 2 days ago associated with diarrhea, improving today Also associated with nausea but no vomitting LGF at onset, now resolved - No BRBPR or melena - cramping LLQ persists  Past Medical History  Diagnosis Date  . MIGRAINE HEADACHE   . Irritable bowel syndrome   . Cancer 11/1979    HX breast cancer  . ASTHMA   . GERD      Review of Systems  Constitutional: Positive for fatigue. Negative for unexpected weight change.  Respiratory: Negative for cough and wheezing.   Cardiovascular: Negative for chest pain.  Musculoskeletal: Negative for arthralgias.       Objective:   Physical Exam BP 110/76  Pulse 91  Temp(Src) 97.9 F (36.6 C) (Oral)  Ht 5\' 6"  (1.676 m)  SpO2 97% BP Readings from Last 3 Encounters:  02/11/11 110/76  08/05/10 120/78  03/03/10 100/78    Physical Exam  Constitutional: She is oriented to person, place, and time. She appears well-developed and well-nourished. No distress.  Neck: Normal range of motion. Neck supple. No JVD present. No thyromegaly present.  Cardiovascular: Normal rate, regular rhythm and normal heart sounds.  No murmur heard. No BLE edema. Pulmonary/Chest: Effort normal and breath sounds normal. No respiratory distress. She has no wheezes.  Abdominal: Soft. Bowel sounds are normal. She exhibits no distension. Mild LLQ tenderness but no rebound/gaurding.    Lab Results  Component Value Date   WBC 6.3 07/29/2009   HGB 13.3 07/29/2009   HCT 39.1 07/29/2009   PLT 443.0* 07/29/2009   CHOL 222* 07/29/2009   TRIG 203.0* 07/29/2009   HDL 66.90 07/29/2009   LDLDIRECT 78.3 07/29/2009   ALT 17 07/29/2009   AST 19 07/29/2009   NA 141 07/29/2009   K 4.0 07/29/2009   CL 104 07/29/2009   CREATININE 1.1 07/29/2009   BUN 12 07/29/2009   CO2 29 07/29/2009   TSH 2.81 07/29/2009        Assessment & Plan:   Abdominal pain and diarrhea - ongoing 48h - suspect viral gastroenteritis, symptoms improved in last 24 but residual cramping LLQ - check labs - tx symptoms with phenergan and levbid - hold abx unless CBC suggests bacterial infx - erx meds done  Insomnia, chronic - not responding to melatonin - use ambien as needed

## 2011-03-14 ENCOUNTER — Encounter: Payer: Self-pay | Admitting: Internal Medicine

## 2011-03-31 ENCOUNTER — Emergency Department (HOSPITAL_COMMUNITY)
Admission: EM | Admit: 2011-03-31 | Discharge: 2011-03-31 | Disposition: A | Discharge status: Home / Self Care | Payer: BC Managed Care – PPO | Attending: Emergency Medicine | Admitting: Emergency Medicine

## 2011-03-31 ENCOUNTER — Emergency Department (HOSPITAL_COMMUNITY): Payer: BC Managed Care – PPO

## 2011-03-31 DIAGNOSIS — R0602 Shortness of breath: Secondary | ICD-10-CM | POA: Insufficient documentation

## 2011-03-31 DIAGNOSIS — R0682 Tachypnea, not elsewhere classified: Secondary | ICD-10-CM | POA: Insufficient documentation

## 2011-03-31 DIAGNOSIS — F411 Generalized anxiety disorder: Secondary | ICD-10-CM | POA: Insufficient documentation

## 2011-03-31 DIAGNOSIS — J45909 Unspecified asthma, uncomplicated: Secondary | ICD-10-CM | POA: Insufficient documentation

## 2011-03-31 DIAGNOSIS — I10 Essential (primary) hypertension: Secondary | ICD-10-CM | POA: Insufficient documentation

## 2011-04-01 ENCOUNTER — Telehealth: Payer: Self-pay | Admitting: Internal Medicine

## 2011-04-01 NOTE — Telephone Encounter (Signed)
Called, spoke with pt.  States CDY sent her to ED yesterday for acute asthma attack.  Still having some chest tightness, SOB, and nonprod cough but is "much better."  She was sent home with prednisone 20mg  x 3 days and told to f/u with CDY today.  No openings today or this week.  CDY/Katie, pls advise if pt can be worked in today.  Thanks!

## 2011-04-01 NOTE — Telephone Encounter (Signed)
Called, spoke with pt.  She is aware CDY worked her in tomorrow, July 19.  She will need to arrive at 3:45 for 4 pm OV.  She verbalized understanding and voiced no further questions/concerns at this time.

## 2011-04-01 NOTE — Telephone Encounter (Signed)
Please let patient know we have worked her in to see CY on Thursday 04-02-11 at 4pm; please be here at 345pm to check in.

## 2011-04-02 ENCOUNTER — Ambulatory Visit (INDEPENDENT_AMBULATORY_CARE_PROVIDER_SITE_OTHER): Payer: BC Managed Care – PPO | Admitting: Internal Medicine

## 2011-04-02 ENCOUNTER — Encounter: Payer: Self-pay | Admitting: Internal Medicine

## 2011-04-02 VITALS — BP 110/84 | HR 83 | Ht 64.0 in | Wt 153.8 lb

## 2011-04-02 DIAGNOSIS — K219 Gastro-esophageal reflux disease without esophagitis: Secondary | ICD-10-CM

## 2011-04-02 DIAGNOSIS — J45901 Unspecified asthma with (acute) exacerbation: Secondary | ICD-10-CM

## 2011-04-02 MED ORDER — PREDNISONE (PAK) 10 MG PO TABS: 10.0000 mg | ORAL_TABLET | Freq: Every day | ORAL | Status: AC

## 2011-04-02 NOTE — Progress Notes (Signed)
Subjective:    Patient ID: [name] [name], female    DOB: 1951-01-22, 60 y.o.   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 She comes now to re-establish after ER visit. PCP Dr Felicity Coyer. Husband is here.  She was feeling well 2 days ago. As she walked from outdoor heat into [employer] building as she ofen does, she experienced acute chest tightness, cough. She had just spent 2 hours in a patient's house which had recently been sprayed for insects. She was treated at [facility] ER and discharged taking prednisone 20 mg daily x 3 days. Still feels tight, not fully broken. Denies nasal symptoms with this episode. No chest pain, fever, sputum. Some dry cough. Does not usually feel reflux often. Has had 2-3 episodes of bronchitis this year, treated prednisone. Used Epipen in March after angioedema from ?shrimp. Medical hx includes GERD, IBS, hx breast cancer. Reports med allergies include albuterol, augmentin, morphine, sulfa, Singulair.  Review of Systems Constitutional:   No-   weight loss, night sweats, fevers, chills, fatigue, lassitude. HEENT:   No-   headaches, difficulty swallowing, tooth/dental problems, sore throat,                  No-   sneezing, itching, ear ache, nasal congestion, post nasal drip,   CV:  No-   chest pain, orthopnea, PND, swelling in lower extremities, anasarca, dizziness, palpitations  GI:  No-   heartburn, indigestion, abdominal pain, nausea, vomiting, diarrhea,                 change in bowel habits, loss of appetite  Resp: No-   shortness of breath with exertion or at rest.  No-  excess mucus,             No-   productive cough,  No non-productive cough,  No-  coughing up of blood.              No-   change in color of mucus.  No- wheezing.                                   Does have lingering chest tightness.  Skin: No-   rash or lesions.  GU: No-   dysuria, change in color of urine,  no urgency or frequency.  No- flank pain.  MS:  No-   joint pain or swelling.  No- decreased range of motion.  No- back pain.  Psych:  No- change in mood or affect. No depression or anxiety.  No memory loss.      Objective:   Physical Exam General- Alert, Oriented, Affect-appropriate, Distress- none acute Skin- rash-none, lesions- none, excoriation- none Lymphadenopathy- none Head- atraumatic            Eyes- Gross vision intact, PERRLA, conjunctivae clear secretions            Ears- Hearing, canals            Nose- Clear, No-Septal dev, mucus, polyps, erosion, perforation             Throat- Mallampati II , mucosa clear , drainage- none, tonsils- atrophic     Hoarse/ raspy vocal quality Neck- flexible , trachea midline, no stridor , thyroid nl, carotid no bruit Chest - symmetrical excursion , unlabored  Heart/CV- RRR , no murmur , no gallop  , no rub, nl s1 s2                           - JVD- none , edema- none, stasis changes- none, varices- none           Lung- clear to P&A, may be diminished,                 wheeze- none, cough- none , dullness-none, rub- none           Chest wall-  Abd- tender-no, distended-no, bowel sounds-present, HSM- no Br/ Gen/ Rectal- Not done, not indicated Extrem- cyanosis- none, clubbing, none, atrophy- none, strength- nl Neuro- grossly intact to observation         Assessment & Plan:

## 2011-04-02 NOTE — Patient Instructions (Addendum)
Please call as needed  Consider Accolate  Consider daily antihistamine like loratadine as a prophyllactic approach   LOA to return to work Monday, July 23  Prednisone 10 mg, 1 daily x 7 days

## 2011-04-02 NOTE — Assessment & Plan Note (Addendum)
Recent asthma exacerbation with probable heat and odor trigger this time. Known severely atopic. Always suspicious of GERD. Allergic to Singulair. She is going to research Accolate to be sure it has no sulfa group, but it might have a role..  Suggested daily prophy loratadine, and she carries an Epipen.  She doesn't feel ready and stable for return to work- will give LOA.

## 2011-04-04 NOTE — Assessment & Plan Note (Signed)
Standard reflux precautions, although she minimizes this as a problem.

## 2011-06-12 ENCOUNTER — Ambulatory Visit (INDEPENDENT_AMBULATORY_CARE_PROVIDER_SITE_OTHER): Payer: BC Managed Care – PPO | Admitting: Internal Medicine

## 2011-06-12 ENCOUNTER — Ambulatory Visit (INDEPENDENT_AMBULATORY_CARE_PROVIDER_SITE_OTHER)
Admission: RE | Admit: 2011-06-12 | Discharge: 2011-06-12 | Disposition: A | Discharge status: Home / Self Care | Payer: BC Managed Care – PPO | Source: Ambulatory Visit | Attending: Internal Medicine | Admitting: Internal Medicine

## 2011-06-12 ENCOUNTER — Encounter: Payer: Self-pay | Admitting: Internal Medicine

## 2011-06-12 ENCOUNTER — Other Ambulatory Visit (INDEPENDENT_AMBULATORY_CARE_PROVIDER_SITE_OTHER): Payer: BC Managed Care – PPO

## 2011-06-12 ENCOUNTER — Telehealth: Payer: Self-pay | Admitting: *Deleted

## 2011-06-12 VITALS — BP 112/82 | HR 102 | Temp 98.3°F | Ht 65.0 in | Wt 151.1 lb

## 2011-06-12 DIAGNOSIS — M25512 Pain in left shoulder: Secondary | ICD-10-CM

## 2011-06-12 DIAGNOSIS — M67919 Unspecified disorder of synovium and tendon, unspecified shoulder: Secondary | ICD-10-CM

## 2011-06-12 DIAGNOSIS — D473 Essential (hemorrhagic) thrombocythemia: Secondary | ICD-10-CM

## 2011-06-12 DIAGNOSIS — M25519 Pain in unspecified shoulder: Secondary | ICD-10-CM

## 2011-06-12 DIAGNOSIS — R7989 Other specified abnormal findings of blood chemistry: Secondary | ICD-10-CM

## 2011-06-12 DIAGNOSIS — M719 Bursopathy, unspecified: Secondary | ICD-10-CM

## 2011-06-12 DIAGNOSIS — M75102 Unspecified rotator cuff tear or rupture of left shoulder, not specified as traumatic: Secondary | ICD-10-CM

## 2011-06-12 LAB — CBC WITH DIFFERENTIAL/PLATELET
Basophils Relative: 1.3 % (ref 0.0–3.0)
Eosinophils Absolute: 0.2 10*3/uL (ref 0.0–0.7)
HCT: 36.6 % (ref 36.0–46.0)
Lymphs Abs: 2.6 10*3/uL (ref 0.7–4.0)
MCHC: 32.3 g/dL (ref 30.0–36.0)
MCV: 78.3 fl (ref 78.0–100.0)
Monocytes Absolute: 0.6 10*3/uL (ref 0.1–1.0)
Neutrophils Relative %: 48.3 % (ref 43.0–77.0)
Platelets: 537 10*3/uL — ABNORMAL HIGH (ref 150.0–400.0)

## 2011-06-12 MED ORDER — CYCLOBENZAPRINE HCL 10 MG PO TABS: 10.0000 mg | ORAL_TABLET | Freq: Three times a day (TID) | ORAL | Status: DC | PRN

## 2011-06-12 MED ORDER — IBUPROFEN 800 MG PO TABS: 800.0000 mg | ORAL_TABLET | Freq: Three times a day (TID) | ORAL | Status: DC | PRN

## 2011-06-12 NOTE — Patient Instructions (Signed)
It was good to see you today. Test(s) ordered today - xray and labs. Your results will be called to you after review (48-72hours after test completion). If any changes need to be made, you will be notified at that time. Ibuprofen and flexeril for pain - Your prescription(s) have been submitted to your pharmacy. Please take as directed and contact our office if you believe you are having problem(s) with the medication(s). we'll make referral for MRI. Our office will contact you regarding appointment(s) once made.  Get Physical therapy to show you shoulder range of motion exercises!

## 2011-06-12 NOTE — Progress Notes (Signed)
  Subjective:    Patient ID: [name] [name], female    DOB: 03-Mar-1951, 60 y.o.   MRN: [id number]  HPI complains of L neck and shoulder pain Precipitated by overuse injury 12/2010 - lifting patient caused "pop" and pain in L shoulder Pain radiates into L deltoid associated with limited overhead use and spasm in lower neck L>R Denies fever, weakness or numbness Not improved with massage or acupuncture - No use OTC NSAIDs or PT  Past Medical History  Diagnosis Date  . MIGRAINE HEADACHE   . Irritable bowel syndrome   . ASTHMA   . GERD   . BREAST CANCER, HX OF 11/1979  . Headache     Review of Systems  Respiratory: Negative for cough and shortness of breath.   Musculoskeletal: Negative for back pain, joint swelling and arthralgias.       Objective:   Physical Exam BP 112/82  Pulse 102  Temp(Src) 98.3 F (36.8 C) (Oral)  Ht 5\' 5"  (1.651 m)  Wt 151 lb 1.9 oz (68.548 kg)  BMI 25.15 kg/m2  SpO2 98% Constitutional: She is well-developed and well-nourished. No distress. Neck: Normal range of motion. Neck supple. No JVD present. No thyromegaly present.  Cardiovascular: Normal rate, regular rhythm and normal heart sounds.  No murmur heard. No BLE edema. Pulmonary/Chest: Effort normal and breath sounds normal. No respiratory distress. She has no wheezes.  Musculoskeletal: L shoulder with decreased range of motion on forward flexion, abduction, and internal rotation. Positive impingement signs. Decreased strength with stressing of rotator cuff. Unable to pass 70degree overhead motion. Pain with crossed arm adduction. referred pain into distal deltoid. Tender over a.c. joint and subacromial. Neurological: She is alert and oriented to person, place, and time. No cranial nerve deficit. Coordination normal.  Skin: Skin is warm and dry. No rash noted. No erythema.  Psychiatric: She has a normal mood and affect. Her behavior is normal. Judgment and thought content normal.    Prior cervical  xrays 05/2003 and 01/2005 reports reviewed - DDD c6-7>5-6  Lab Results  Component Value Date   WBC 8.2 02/11/2011   HGB 11.7* 02/11/2011   HCT 34.9* 02/11/2011   MCV 81.6 02/11/2011   PLT 515.0* 02/11/2011      Assessment & Plan:  L shoulder pain, RTC syndrome - hx injury 12/2010 - now complicated with adhesive capsulitis - ?RTC tear precipitating the limited ROM- rx NSAIDs and flexeril + xray and MRI - consider need for ortho eval - recommended PT (pt will arrange)  Thrombocytosis 01/2011 - mild ?reactive - recheck now

## 2011-06-12 NOTE — Telephone Encounter (Signed)
Called pt concerning labs. Pt states she want to wait on the referral to see hematologist right now, but when she decide to go would only see Dr. Cyndie Chime, Dr,. Mancel Bale, and Dr. Darnelle Catalan...06/12/11@3 :41pm/LMB

## 2011-06-12 NOTE — Telephone Encounter (Signed)
Noted. thx 

## 2011-06-15 ENCOUNTER — Telehealth: Payer: Self-pay | Admitting: *Deleted

## 2011-06-15 NOTE — Telephone Encounter (Signed)
Noted - ok to hold on MRI pending med/conserv tx - will hold on heme eval at this time but will keep pt pref in mind - thx

## 2011-06-15 NOTE — Telephone Encounter (Signed)
Pt call and wanted to inform md that she had to cancel MRI. Her out-of-pocket fee was $862.00, and she can not pay that at this time. Also she states if md really insist on her to see hematologist would like to see Dr. Isabel Caprice in [place]....06/15/11@3 :55pm/LMB

## 2011-06-16 ENCOUNTER — Other Ambulatory Visit: Payer: BC Managed Care – PPO

## 2011-07-06 ENCOUNTER — Other Ambulatory Visit: Payer: Self-pay | Admitting: Internal Medicine

## 2011-08-10 ENCOUNTER — Encounter: Payer: Self-pay | Admitting: *Deleted

## 2011-08-10 ENCOUNTER — Ambulatory Visit (INDEPENDENT_AMBULATORY_CARE_PROVIDER_SITE_OTHER): Payer: BC Managed Care – PPO | Admitting: Internal Medicine

## 2011-08-10 ENCOUNTER — Encounter: Payer: Self-pay | Admitting: Internal Medicine

## 2011-08-10 VITALS — BP 110/72 | HR 99 | Temp 98.6°F

## 2011-08-10 DIAGNOSIS — J45909 Unspecified asthma, uncomplicated: Secondary | ICD-10-CM

## 2011-08-10 DIAGNOSIS — J069 Acute upper respiratory infection, unspecified: Secondary | ICD-10-CM

## 2011-08-10 MED ORDER — PREDNISONE (PAK) 10 MG PO TABS: 10.0000 mg | ORAL_TABLET | ORAL | Status: DC

## 2011-08-10 MED ORDER — CHLORPHENIRAMINE-HYDROCODONE 8-10 MG/5ML PO LQCR: 5.0000 mL | Freq: Two times a day (BID) | ORAL | Status: DC | PRN

## 2011-08-10 NOTE — Progress Notes (Signed)
  Subjective:    HPI  complains of cough symptoms  Onset yesterday, increasing symptoms  associated with mild sore throat, mild headache - Also myalgias, sinus pressure and mild-mod chest congestion no fever or sputum No relief with OTC meds and usual pulmonary medications Precipitated by sick contacts  Past Medical History  Diagnosis Date  . MIGRAINE HEADACHE   . Irritable bowel syndrome   . ASTHMA   . GERD   . BREAST CANCER, HX OF 11/1979    Review of Systems Constitutional: No fever or night sweats, no unexpected weight change Pulmonary: No pleurisy or hemoptysis Cardiovascular: No chest pain or palpitations     Objective:   Physical Exam BP 110/72  Pulse 99  Temp(Src) 98.6 F (37 C) (Oral)  SpO2 96% GEN: mildly ill appearing, hoarse and audible chest congestion, coughing spasms HENT: NCAT, no sinus tenderness bilaterally, nares with clear discharge, oropharynx mild erythema, no exudate Eyes: Vision grossly intact, no conjunctivitis Lungs: exp wheeze and rhonchi bilaterally, no increased work of breathing Cardiovascular: Regular rate and rhythm, no bilateral edema      Assessment & Plan:  Viral URI  Cough, related bronchospasm with underlying asthma   Explained lack of efficacy for antibiotics in viral disease Hold empiric antibiotics unless symptom duration greater than 7 days - plan Zpak if needed given other med intolerance and allergies Prescription cough suppression and steroids x 12d taper - new prescriptions done - declines IM  Medrol Continue routine asthma meds as ongoing Symptomatic care with Tylenol, hydration and rest - work note for tomorrow given salt gargle advised as needed

## 2011-08-10 NOTE — Patient Instructions (Signed)
It was good to see you today. If you develop worsening symptoms or fever, call and we can reconsider antibiotics, but it does not appear necessary to use antibiotics at this time. Prednisone taper times next 12 days and Tussionex cough syrup to use at night - Your prescription(s) have been submitted to your pharmacy. Please take as directed and contact our office if you believe you are having problem(s) with the medication(s). Work note provided for you to remain out today and tomorrow Plan hydration, rest and Tylenol as needed

## 2011-08-14 ENCOUNTER — Telehealth: Payer: Self-pay

## 2011-08-14 MED ORDER — AZITHROMYCIN 250 MG PO TABS: ORAL_TABLET | ORAL | Status: DC

## 2011-08-14 NOTE — Telephone Encounter (Signed)
Pt called stating Prednisone has not helped with URI sxs, pt is requesting Rx for ABX, please advise.

## 2011-08-14 NOTE — Telephone Encounter (Signed)
Zpak as per our OV discussion - erx done

## 2011-08-14 NOTE — Telephone Encounter (Signed)
Pt informed of Rx/pharmacy via VM 

## 2011-09-15 ENCOUNTER — Other Ambulatory Visit: Payer: Self-pay | Admitting: Internal Medicine

## 2011-09-16 NOTE — Telephone Encounter (Signed)
Faxed script back to [facility] @ [phone]...09/16/11@1 :40pm/LMB

## 2011-10-28 ENCOUNTER — Other Ambulatory Visit: Payer: Self-pay | Admitting: Internal Medicine

## 2011-11-04 ENCOUNTER — Encounter: Payer: Self-pay | Admitting: Internal Medicine

## 2011-11-04 ENCOUNTER — Ambulatory Visit (INDEPENDENT_AMBULATORY_CARE_PROVIDER_SITE_OTHER): Payer: BC Managed Care – PPO | Admitting: Internal Medicine

## 2011-11-04 VITALS — BP 112/88 | HR 93 | Temp 97.6°F

## 2011-11-04 DIAGNOSIS — T782XXA Anaphylactic shock, unspecified, initial encounter: Secondary | ICD-10-CM

## 2011-11-04 DIAGNOSIS — J45901 Unspecified asthma with (acute) exacerbation: Secondary | ICD-10-CM

## 2011-11-04 MED ORDER — HYDROCOD POLST-CHLORPHEN POLST 10-8 MG/5ML PO LQCR: 5.0000 mL | Freq: Two times a day (BID) | ORAL | Status: DC | PRN

## 2011-11-04 MED ORDER — IBUPROFEN 800 MG PO TABS: 800.0000 mg | ORAL_TABLET | Freq: Three times a day (TID) | ORAL | Status: DC | PRN

## 2011-11-04 MED ORDER — PREDNISONE (PAK) 10 MG PO TABS: 10.0000 mg | ORAL_TABLET | ORAL | Status: DC

## 2011-11-04 MED ORDER — EPINEPHRINE 0.3 MG/0.3ML IJ DEVI: 0.3000 mg | Freq: Once | INTRAMUSCULAR | Status: DC | PRN

## 2011-11-04 MED ORDER — AZITHROMYCIN 250 MG PO TABS: ORAL_TABLET | ORAL | Status: DC

## 2011-11-04 NOTE — Progress Notes (Signed)
  Subjective:    HPI  complains of cough symptoms  Onset yesterday, increasing symptoms  associated with mild sore throat, mild headache - Also myalgias, sinus pressure and mild-mod chest congestion no fever or sputum No relief with OTC meds and usual pulmonary medications Precipitated by sick contacts  Past Medical History  Diagnosis Date  . MIGRAINE HEADACHE   . Irritable bowel syndrome   . ASTHMA   . GERD   . BREAST CANCER, HX OF 11/1979    Review of Systems Constitutional: No night sweats, no unexpected weight change Pulmonary: No pleurisy or hemoptysis Cardiovascular: No chest pain or palpitations     Objective:   Physical Exam BP 112/88  Pulse 93  Temp(Src) 97.6 F (36.4 C) (Oral)  SpO2 99% GEN: mildly ill appearing, hoarse and audible chest congestion, coughing spasms HENT: NCAT, no sinus tenderness bilaterally, nares with clear discharge, oropharynx mild erythema, no exudate Eyes: Vision grossly intact, no conjunctivitis Lungs: exp wheeze and rhonchi bilaterally, no increased work of breathing at rest Cardiovascular: Regular rate and rhythm, no bilateral edema      Assessment & Plan:  Viral URI >> precipitating acute asthmatic bronchitis Cough, related bronchospasm with underlying asthma   empiric antibiotics prescribed given symptom duration greater than 7 days and co morbid dz (asthma) - Zpak given other med intolerance and allergies Prescription cough suppression and steroids x 12d taper - new prescriptions done - declines IM Medrol Continue routine asthma meds as ongoing - advised follow up with pulm on same - ?keep chronic abx/pred rx "on hand"? Defer to pulm to determine same Symptomatic care with Tylenol, hydration and rest - salt gargle advised as needed

## 2011-11-04 NOTE — Assessment & Plan Note (Signed)
Last reaction 03/2011 - ER eval/tx precipitated by cleaning/pest extermination products 12/2010 attack due to shrimp - but used Epipen at home rather than ER Most severe reaction to flu, immunizations and meds Refill epipen and follow up with pulm/allergist as planned

## 2011-11-04 NOTE — Patient Instructions (Signed)
It was good to see you today. Prednisone taper over next 12 days, Zpak and Tussionex cough syrup to use at night - Your prescription(s) have been submitted to your pharmacy. Please take as directed and contact our office if you believe you are having problem(s) with the medication(s). Plan hydration, rest and Tylenol as needed Schedule followup with Dr. Maple Hudson as discussed, also Dr. Cherly Hensen

## 2011-11-27 ENCOUNTER — Encounter: Payer: Self-pay | Admitting: Internal Medicine

## 2011-11-27 ENCOUNTER — Ambulatory Visit (INDEPENDENT_AMBULATORY_CARE_PROVIDER_SITE_OTHER): Payer: BC Managed Care – PPO | Admitting: Internal Medicine

## 2011-11-27 VITALS — BP 136/90 | HR 114 | Ht 64.0 in | Wt 155.4 lb

## 2011-11-27 DIAGNOSIS — K219 Gastro-esophageal reflux disease without esophagitis: Secondary | ICD-10-CM

## 2011-11-27 DIAGNOSIS — J45909 Unspecified asthma, uncomplicated: Secondary | ICD-10-CM

## 2011-11-27 NOTE — Progress Notes (Signed)
Patient ID: [name] [name], female    DOB: 21-Jun-1951, 61 y.o.   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 She comes now to re-establish after ER visit. PCP Dr Felicity Coyer. Husband is here.  She was feeling well 2 days ago. As she walked from outdoor heat into [employer] building as she often does, she experienced acute chest tightness, cough. She had just spent 2 hours in a patient's house which had recently been sprayed for insects. She was treated at [facility] ER and discharged taking prednisone 20 mg daily x 3 days. Still feels tight, not fully broken. Denies nasal symptoms with this episode. No chest pain, fever, sputum. Some dry cough. Does not usually feel reflux often. Has had 2-3 episodes of bronchitis this year, treated prednisone. Used Epipen in March after angioedema from ?shrimp. Medical hx includes GERD, IBS, hx breast cancer. Reports med allergies include albuterol, augmentin, morphine, sulfa, Singulair.  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 She comes now to re-establish after ER visit. PCP Dr Felicity Coyer. Husband is here.  2 episodes of bronchitis since Christmas. Some increased shortness of breath today but nothing really acute. Strong odors or trigger. She is really focused on avoiding any medication that might have a sulfite and I tried to explain the difference between sulfa antibiotics, sulfite preservatives and molecular sulfur. She expects these to cause shortness of breath, swelling and wheeze. She is taking Dulera 100 twice daily and Xopenex HFA about once daily.  ROS-see HPI Constitutional:   No-   weight loss, night sweats, fevers, chills, fatigue, lassitude. HEENT:   No-  headaches, difficulty swallowing, tooth/dental problems, sore throat,       No-  sneezing, itching, ear ache, nasal congestion, post nasal drip,  CV:  No-   chest  pain, orthopnea, PND, swelling in lower extremities, anasarca,  dizziness, palpitations Resp: No-   shortness of breath with exertion or at rest.              No-   productive cough,  No non-productive cough,  No- coughing up of blood.              No-   change in color of mucus.  No- wheezing.   Skin: No-   rash or lesions. GI:  No-   heartburn, indigestion, abdominal pain, nausea, vomiting,  GU:  MS:  No-   joint pain or swelling. Neuro-     nothing unusual Psych:  No- change in mood or affect. No depression or anxiety.  No memory loss.      Objective:   Physical Exam General- Alert, Oriented, Affect-appropriate, Distress- none acute Skin- rash-none, lesions- none, excoriation- none Lymphadenopathy- none Head- atraumatic            Eyes- Gross vision intact, PERRLA, conjunctivae clear secretions            Ears- Hearing, canals            Nose- Clear, No-Septal dev, mucus, polyps, erosion, perforation             Throat- Mallampati II , mucosa red , drainage- none, tonsils- atrophic ,active gag Neck- flexible , trachea midline, no stridor , thyroid nl, carotid no bruit Chest - symmetrical excursion , unlabored           Heart/CV- RRR , no murmur , no gallop  , no  rub, nl s1 s2                           - JVD- none , edema- none, stasis changes- none, varices- none           Lung- clear to P&A,wheeze- none, cough- none , dullness-none, rub- none           Chest wall-  Abd-  Br/ Gen/ Rectal- Not done, not indicated Extrem- cyanosis- none, clubbing, none, atrophy- none, strength- nl Neuro- grossly intact to observation

## 2011-11-27 NOTE — Patient Instructions (Signed)
Sample Dulera 200   Try 2 puffs then rinse mouth well, twice daily. See if this stabilizes your airways a little better. When it is out, you can try going back to the 100 strength.   Please call as needed

## 2011-11-30 NOTE — Assessment & Plan Note (Signed)
Reflux precautions reviewed. 

## 2011-11-30 NOTE — Assessment & Plan Note (Addendum)
Strongly suspect that reflux is more important than she realizes. I tried to educate her on this. She will continue present meds. Sample Dulera 200. When that is used up returned to [facility] [address]. See if it stabilizes her current mild irritability.

## 2012-01-21 ENCOUNTER — Other Ambulatory Visit: Payer: Self-pay | Admitting: Internal Medicine

## 2012-04-01 ENCOUNTER — Ambulatory Visit: Payer: BC Managed Care – PPO | Admitting: Internal Medicine

## 2012-04-20 ENCOUNTER — Other Ambulatory Visit: Payer: Self-pay | Admitting: *Deleted

## 2012-04-20 MED ORDER — ZOLPIDEM TARTRATE 10 MG PO TABS: 10.0000 mg | ORAL_TABLET | Freq: Every evening | ORAL | Status: DC | PRN

## 2012-04-20 NOTE — Telephone Encounter (Signed)
Faxed script back to [facility]... 04/20/12@4 :55pm/LMB

## 2012-06-10 ENCOUNTER — Other Ambulatory Visit: Payer: Self-pay | Admitting: Internal Medicine

## 2012-07-22 ENCOUNTER — Emergency Department (HOSPITAL_COMMUNITY)
Admission: EM | Admit: 2012-07-22 | Discharge: 2012-07-22 | Disposition: A | Discharge status: Home / Self Care | Payer: BC Managed Care – PPO | Attending: Emergency Medicine | Admitting: Emergency Medicine

## 2012-07-22 ENCOUNTER — Emergency Department (HOSPITAL_COMMUNITY): Payer: BC Managed Care – PPO

## 2012-07-22 DIAGNOSIS — K219 Gastro-esophageal reflux disease without esophagitis: Secondary | ICD-10-CM | POA: Insufficient documentation

## 2012-07-22 DIAGNOSIS — K589 Irritable bowel syndrome without diarrhea: Secondary | ICD-10-CM | POA: Insufficient documentation

## 2012-07-22 DIAGNOSIS — Z79899 Other long term (current) drug therapy: Secondary | ICD-10-CM | POA: Insufficient documentation

## 2012-07-22 DIAGNOSIS — G43909 Migraine, unspecified, not intractable, without status migrainosus: Secondary | ICD-10-CM | POA: Insufficient documentation

## 2012-07-22 DIAGNOSIS — Z853 Personal history of malignant neoplasm of breast: Secondary | ICD-10-CM | POA: Insufficient documentation

## 2012-07-22 DIAGNOSIS — Z7982 Long term (current) use of aspirin: Secondary | ICD-10-CM | POA: Insufficient documentation

## 2012-07-22 DIAGNOSIS — R05 Cough: Secondary | ICD-10-CM | POA: Insufficient documentation

## 2012-07-22 DIAGNOSIS — R059 Cough, unspecified: Secondary | ICD-10-CM | POA: Insufficient documentation

## 2012-07-22 DIAGNOSIS — J45909 Unspecified asthma, uncomplicated: Secondary | ICD-10-CM | POA: Insufficient documentation

## 2012-07-22 MED ORDER — LEVALBUTEROL HCL 1.25 MG/3ML IN NEBU
1.2500 mg | INHALATION_SOLUTION | Freq: Once | RESPIRATORY_TRACT | Status: AC
  Administered 2012-07-22: 1.25 mg via RESPIRATORY_TRACT
  Filled 2012-07-22: qty 3

## 2012-07-22 MED ORDER — LEVALBUTEROL HCL 1.25 MG/0.5ML IN NEBU
1.2500 mg | INHALATION_SOLUTION | RESPIRATORY_TRACT | Status: AC
  Administered 2012-07-22: 1.25 mg via RESPIRATORY_TRACT
  Filled 2012-07-22: qty 0.5

## 2012-07-22 MED ORDER — PREDNISONE 20 MG PO TABS: 60.0000 mg | ORAL_TABLET | Freq: Every day | ORAL | Status: DC

## 2012-07-22 MED ORDER — METHYLPREDNISOLONE SODIUM SUCC 125 MG IJ SOLR
INTRAMUSCULAR | Status: AC
  Administered 2012-07-22: 125 mg
  Filled 2012-07-22: qty 2

## 2012-07-22 MED ORDER — EPINEPHRINE 0.3 MG/0.3ML IJ DEVI
INTRAMUSCULAR | Status: AC
  Filled 2012-07-22: qty 0.3

## 2012-07-22 MED ORDER — METHYLPREDNISOLONE SODIUM SUCC 125 MG IJ SOLR: 125.0000 mg | Freq: Once | INTRAMUSCULAR | Status: DC

## 2012-07-22 NOTE — ED Notes (Signed)
Started to have asthma attack and was sent to er allergic to albuteral

## 2012-07-22 NOTE — ED Provider Notes (Signed)
History     CSN: [id number]  Arrival date & time 07/22/12  1355   First MD Initiated Contact with Patient 07/22/12 1356      Chief Complaint  Patient presents with  . Wheezing    (Consider location/radiation/quality/duration/timing/severity/associated sxs/prior treatment) Patient is a 61 y.o. female presenting with wheezing. The history is provided by the patient.  Wheezing  The current episode started today. Associated symptoms include cough, shortness of breath and wheezing. Pertinent negatives include no chest pain.   patient began to have an asthma attack this afternoon. It started acutely. She has a history of asthma and cannot take albuterol 2 to an allergy to sulfa component. She had to go to the ER once year ago but has not had to be admitted for her asthma. No fevers. She's had a persistent cough with minimal sputum. No chest pain. No sick contacts. No dominant. No lightheadedness or dizziness.  Past Medical History  Diagnosis Date  . MIGRAINE HEADACHE   . Irritable bowel syndrome   . ASTHMA   . GERD   . BREAST CANCER, HX OF 11/1979    Past Surgical History  Procedure Date  . Mastectomy 11/1979    Bilaterally & implants  . Abdominal hysterectomy 11/1979  . Tubal ligation 1975  . Tonsillectomy 1955  . Left wrist 1963    Family History  Problem Relation Age of Onset  . Breast cancer Mother   . Uterine cancer Mother   . Coronary artery disease Mother   . Breast cancer Sister     Endometriosis    History  Substance Use Topics  . Smoking status: Never Smoker   . 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 Alcohol Use: Yes    OB History    Grav Para Term Preterm Abortions TAB SAB Ect Mult Living                  Review of Systems  Constitutional: Negative for activity change and appetite change.  HENT: Negative for neck stiffness.   Eyes: Negative for pain.  Respiratory: Positive for cough,  shortness of breath and wheezing. Negative for chest tightness.   Cardiovascular: Negative for chest pain and leg swelling.  Gastrointestinal: Negative for nausea, vomiting, abdominal pain and diarrhea.  Genitourinary: Negative for flank pain.  Musculoskeletal: Negative for back pain.  Skin: Negative for rash.  Neurological: Negative for weakness, numbness and headaches.  Psychiatric/Behavioral: Negative for behavioral problems.    Allergies  Albuterol sulfate; Amoxicillin-pot clavulanate; Fluzone; Influenza vaccines; Morphine; Povidone; Sulfonamide derivatives; Hyoscyamine sulfate; and Meperidine hcl  Home Medications   Current Outpatient Rx  Name  Route  Sig  Dispense  Refill  . ASPIRIN-ACETAMINOPHEN-CAFFEINE 250-250-65 MG PO TABS   Oral   Take 1 tablet by mouth every 6 (six) hours as needed. For migraine         . CALCIUM CARBONATE-VIT D-MIN 600-200 MG-UNIT PO TABS   Oral   Take 1 tablet by mouth 2 (two) times daily.         . CYCLOBENZAPRINE HCL 10 MG PO TABS   Oral   Take 10 mg by mouth 3 (three) times daily as needed. For muscle spasms         . DIPHENHYDRAMINE HCL 25 MG PO TABS   Oral   Take 25 mg by mouth every 8 (eight) hours as needed. For allergies         .  EPINEPHRINE 0.3 MG/0.3ML IJ DEVI   Intramuscular   Inject 0.3 mLs (0.3 mg total) into the muscle once as needed.   1 Device   1   . IBUPROFEN 800 MG PO TABS   Oral   Take 1 tablet (800 mg total) by mouth every 8 (eight) hours as needed for pain.   30 tablet   1   . LEVALBUTEROL TARTRATE 45 MCG/ACT IN AERO   Inhalation   Inhale 1-2 puffs into the lungs 4 (four) times daily as needed. For shortness of breath         . MELATONIN 10 MG PO CAPS   Oral   Take 10 mg by mouth at bedtime.         . MOMETASONE FURO-FORMOTEROL FUM 200-5 MCG/ACT IN AERO   Inhalation   Inhale 2 puffs into the lungs 2 (two) times daily.         Marland Kitchen ONE-DAILY MULTI VITAMINS PO TABS   Oral   Take 1 tablet by mouth  daily.           Marland Kitchen VITAMIN E 400 UNITS PO CAPS   Oral   Take 400 Units by mouth daily.           Marland Kitchen ZOLPIDEM TARTRATE 10 MG PO TABS   Oral   Take 5 mg by mouth at bedtime as needed. For sleep         . PREDNISONE 20 MG PO TABS   Oral   Take 3 tablets (60 mg total) by mouth daily.   6 tablet   0     BP 117/73  Pulse 145  Resp 30  SpO2 99%  Physical Exam  Nursing note and vitals reviewed. Constitutional: She is oriented to person, place, and time. She appears well-developed and well-nourished.  HENT:  Head: Normocephalic and atraumatic.  Eyes: EOM are normal. Pupils are equal, round, and reactive to light.  Neck: Normal range of motion. Neck supple.  Cardiovascular: Normal rate, regular rhythm and normal heart sounds.   No murmur heard. Pulmonary/Chest: Effort normal. No respiratory distress. She has wheezes. She has no rales.       Patient with prolonged expirations and frequent coughs. She has a quiet chest but does not appear to be in respiratory distress. With breaks in the coughing her lungs sound improved  Abdominal: Soft. Bowel sounds are normal. She exhibits no distension. There is no tenderness. There is no rebound and no guarding.  Musculoskeletal: Normal range of motion.  Neurological: She is alert and oriented to person, place, and time. No cranial nerve deficit.  Skin: Skin is warm and dry.  Psychiatric: She has a normal mood and affect. Her speech is normal.    ED Course  Procedures (including critical care time)  Labs Reviewed - No data to display Dg Chest [facility] 1 View  07/22/2012  *RADIOLOGY REPORT*  Clinical Data: Shortness of breath.  Asthma.  Breathing treatment. Cough.  PORTABLE CHEST - 1 VIEW  Comparison: 03/31/2011  Findings: The film is made with shallow lung inflation.  There is left base atelectasis.  There is accentuation perihilar peribronchial markings, made worse by shallow inflation.  No definite consolidations or pleural effusions are  identified.  No evidence for pulmonary edema.  IMPRESSION:  1.  Shallow inflation. 2.  Left lower lobe atelectasis. 3.  Bronchitic changes.   Original Report Authenticated By: Norva Pavlov, M.D.      1. Asthma  MDM  Patient with asthma exacerbation. Feels much better after Xopenex. She's also been given steroids. She'll be discharged home to followup with her primary care doctor as planned        Juliet Rude. Rubin Payor, MD 07/22/12 1620

## 2012-07-25 ENCOUNTER — Ambulatory Visit (INDEPENDENT_AMBULATORY_CARE_PROVIDER_SITE_OTHER): Payer: BC Managed Care – PPO | Admitting: Internal Medicine

## 2012-07-25 ENCOUNTER — Encounter: Payer: Self-pay | Admitting: Internal Medicine

## 2012-07-25 VITALS — BP 128/82 | HR 93 | Temp 97.7°F | Ht 65.0 in | Wt 151.8 lb

## 2012-07-25 DIAGNOSIS — Z1239 Encounter for other screening for malignant neoplasm of breast: Secondary | ICD-10-CM

## 2012-07-25 DIAGNOSIS — Z136 Encounter for screening for cardiovascular disorders: Secondary | ICD-10-CM

## 2012-07-25 DIAGNOSIS — Z0000 Encounter for general adult medical examination without abnormal findings: Secondary | ICD-10-CM

## 2012-07-25 MED ORDER — CYCLOBENZAPRINE HCL 10 MG PO TABS: 10.0000 mg | ORAL_TABLET | Freq: Three times a day (TID) | ORAL | Status: DC | PRN

## 2012-07-25 MED ORDER — LEVALBUTEROL HCL 1.25 MG/3ML IN NEBU: 1.2500 mg | INHALATION_SOLUTION | RESPIRATORY_TRACT | Status: DC | PRN

## 2012-07-25 NOTE — Patient Instructions (Signed)
It was good to see you today. We have reviewed your prior records including labs and tests today Health Maintenance reviewed - refer for mammo screening now -all other recommended immunizations and age-appropriate screenings are up-to-date or declined. Test(s) ordered today. Your results will be released to MyChart (or called to you) after review, usually within 72hours after test completion. If any changes need to be made, you will be notified at that same time. Medications reviewed, add xopinex nebs as needed -no changes at this time. Please schedule followup in 12 months, call sooner if problems.  Health Maintenance, Females A healthy lifestyle and preventative care can promote health and wellness.  Maintain regular health, dental, and eye exams.   Eat a healthy diet. Foods like vegetables, fruits, whole grains, low-fat dairy products, and lean protein foods contain the nutrients you need without too many calories. Decrease your intake of foods high in solid fats, added sugars, and salt. Get information about a proper diet from your caregiver, if necessary.   Regular physical exercise is one of the most important things you can do for your health. Most adults should get at least 150 minutes of moderate-intensity exercise (any activity that increases your heart rate and causes you to sweat) each week. In addition, most adults need muscle-strengthening exercises on 2 or more days a week.     Maintain a healthy weight. The body mass index (BMI) is a screening tool to identify possible weight problems. It provides an estimate of body fat based on height and weight. Your caregiver can help determine your BMI, and can help you achieve or maintain a healthy weight. For adults 20 years and older:   A BMI below 18.5 is considered underweight.   A BMI of 18.5 to 24.9 is normal.   A BMI of 25 to 29.9 is considered overweight.   A BMI of 30 and above is considered obese.   Maintain normal blood  lipids and cholesterol by exercising and minimizing your intake of saturated fat. Eat a balanced diet with plenty of fruits and vegetables. Blood tests for lipids and cholesterol should begin at age 99 and be repeated every 5 years. If your lipid or cholesterol levels are high, you are over 50, or you are a high risk for heart disease, you may need your cholesterol levels checked more frequently. Ongoing high lipid and cholesterol levels should be treated with medicines if diet and exercise are not effective.   If you smoke, find out from your caregiver how to quit. If you do not use tobacco, do not start.   If you are pregnant, do not drink alcohol. If you are breastfeeding, be very cautious about drinking alcohol. If you are not pregnant and choose to drink alcohol, do not exceed 1 drink per day. One drink is considered to be 12 ounces (355 mL) of beer, 5 ounces (148 mL) of wine, or 1.5 ounces (44 mL) of liquor.   Avoid use of street drugs. Do not share needles with anyone. Ask for help if you need support or instructions about stopping the use of drugs.   High blood pressure causes heart disease and increases the risk of stroke. Blood pressure should be checked at least every 1 to 2 years. Ongoing high blood pressure should be treated with medicines, if weight loss and exercise are not effective.   If you are 90 to 63 years old, ask your caregiver if you should take aspirin to prevent strokes.   Diabetes screening  involves taking a blood sample to check your fasting blood sugar level. This should be done once every 3 years, after age 19, if you are within normal weight and without risk factors for diabetes. Testing should be considered at a younger age or be carried out more frequently if you are overweight and have at least 1 risk factor for diabetes.   Breast cancer screening is essential preventative care for women. You should practice "breast self-awareness." This means understanding the normal  appearance and feel of your breasts and may include breast self-examination. Any changes detected, no matter how small, should be reported to a caregiver. Women in their 33s and 30s should have a clinical breast exam (CBE) by a caregiver as part of a regular health exam every 1 to 3 years. After age 24, women should have a CBE every year. Starting at age 65, women should consider having a mammogram (breast X-ray) every year. Women who have a family history of breast cancer should talk to their caregiver about genetic screening. Women at a high risk of breast cancer should talk to their caregiver about having an MRI and a mammogram every year.   The Pap test is a screening test for cervical cancer. Women should have a Pap test starting at age 74. Between ages 49 and 35, Pap tests should be repeated every 2 years. Beginning at age 81, you should have a Pap test every 3 years as long as the past 3 Pap tests have been normal. If you had a hysterectomy for a problem that was not cancer or a condition that could lead to cancer, then you no longer need Pap tests. If you are between ages 22 and 82, and you have had normal Pap tests going back 10 years, you no longer need Pap tests. If you have had past treatment for cervical cancer or a condition that could lead to cancer, you need Pap tests and screening for cancer for at least 20 years after your treatment. If Pap tests have been discontinued, risk factors (such as a new sexual partner) need to be reassessed to determine if screening should be resumed. Some women have medical problems that increase the chance of getting cervical cancer. In these cases, your caregiver may recommend more frequent screening and Pap tests.   The human papillomavirus (HPV) test is an additional test that may be used for cervical cancer screening. The HPV test looks for the virus that can cause the cell changes on the cervix. The cells collected during the Pap test can be tested for HPV.  The HPV test could be used to screen women aged 76 years and older, and should be used in women of any age who have unclear Pap test results. After the age of 56, women should have HPV testing at the same frequency as a Pap test.   Colorectal cancer can be detected and often prevented. Most routine colorectal cancer screening begins at the age of 64 and continues through age 44. However, your caregiver may recommend screening at an earlier age if you have risk factors for colon cancer. On a yearly basis, your caregiver may provide home test kits to check for hidden blood in the stool. Use of a small camera at the end of a tube, to directly examine the colon (sigmoidoscopy or colonoscopy), can detect the earliest forms of colorectal cancer. Talk to your caregiver about this at age 73, when routine screening begins. Direct examination of the colon should be repeated  every 5 to 10 years through age 6, unless early forms of pre-cancerous polyps or small growths are found.   Hepatitis C blood testing is recommended for all people born from [birth year] through 1967 and any individual with known risks for hepatitis C.   Practice safe sex. Use condoms and avoid high-risk sexual practices to reduce the spread of sexually transmitted infections (STIs). Sexually active women aged 66 and younger should be checked for Chlamydia, which is a common sexually transmitted infection. Older women with new or multiple partners should also be tested for Chlamydia. Testing for other STIs is recommended if you are sexually active and at increased risk.   Osteoporosis is a disease in which the bones lose minerals and strength with aging. This can result in serious bone fractures. The risk of osteoporosis can be identified using a bone density scan. Women ages 63 and over and women at risk for fractures or osteoporosis should discuss screening with their caregivers. Ask your caregiver whether you should be taking a calcium supplement or  vitamin D to reduce the rate of osteoporosis.   Menopause can be associated with physical symptoms and risks. Hormone replacement therapy is available to decrease symptoms and risks. You should talk to your caregiver about whether hormone replacement therapy is right for you.   Use sunscreen with a sun protection factor (SPF) of 30 or greater. Apply sunscreen liberally and repeatedly throughout the day. You should seek shade when your shadow is shorter than you. Protect yourself by wearing long sleeves, pants, a wide-brimmed hat, and sunglasses year round, whenever you are outdoors.   Notify your caregiver of new moles or changes in moles, especially if there is a change in shape or color. Also notify your caregiver if a mole is larger than the size of a pencil eraser.   Stay current with your immunizations.  Document Released: 03/16/2011 Document Revised: 11/23/2011 Document Reviewed: 03/16/2011 [facility] Patient Information 2013 [place], [place].

## 2012-07-25 NOTE — Progress Notes (Signed)
Subjective:    Patient ID: [name] [name], female    DOB: 23-Feb-1951, 61 y.o.   MRN: [id number]  HPI  patient is here today for annual physical. Patient feels well and has no complaints.  Past Medical History  Diagnosis Date  . MIGRAINE HEADACHE   . Irritable bowel syndrome   . ASTHMA   . GERD   . BREAST CANCER, HX OF 11/1979   Family History  Problem Relation Age of Onset  . Breast cancer Mother   . Uterine cancer Mother   . Coronary artery disease Mother   . Breast cancer Sister     Endometriosis   History  Substance Use Topics  . Smoking status: Never Smoker   . 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 Alcohol Use: Yes    Review of Systems Constitutional: Negative for fever or weight change.  Respiratory: Negative for cough and shortness of breath.   Cardiovascular: Negative for chest pain or palpitations.  Gastrointestinal: Negative for abdominal pain, no bowel changes.  Musculoskeletal: Negative for gait problem or joint swelling.  Skin: Negative for rash.  Neurological: Negative for dizziness or headache.  No other specific complaints in a complete review of systems (except as listed in HPI above).     Objective:   Physical Exam BP 128/82  Pulse 93  Temp 97.7 F (36.5 C) (Oral)  Ht 5\' 5"  (1.651 m)  Wt 151 lb 12.8 oz (68.856 kg)  BMI 25.26 kg/m2  SpO2 95% Wt Readings from Last 3 Encounters:  07/25/12 151 lb 12.8 oz (68.856 kg)  11/27/11 155 lb 6.4 oz (70.489 kg)  06/12/11 151 lb 1.9 oz (68.548 kg)   Constitutional: She appears well-developed and well-nourished. No distress.  HENT: Head: Normocephalic and atraumatic. Ears: B TMs ok, no erythema or effusion; Nose: Nose normal. Mouth/Throat: Oropharynx is clear and moist. No oropharyngeal exudate.  Eyes: Conjunctivae and EOM are normal. Pupils are equal, round, and reactive to light. No scleral icterus.  Neck: Normal range of motion. Neck  supple. No JVD present. No thyromegaly present.  Cardiovascular: Normal rate, regular rhythm and normal heart sounds.  No murmur heard. No BLE edema. Pulmonary/Chest: Effort normal and breath sounds normal. No respiratory distress. She has no wheezes.  Abdominal: Soft. Bowel sounds are normal. She exhibits no distension. There is no tenderness. no masses Musculoskeletal: Normal range of motion, no joint effusions. No gross deformities Neurological: She is alert and oriented to person, place, and time. No cranial nerve deficit. Coordination normal.  Skin: Skin is warm and dry. No rash noted. No erythema.  Psychiatric: She has a normal mood and affect. Her behavior is normal. Judgment and thought content normal.   Lab Results  Component Value Date   WBC 6.7 06/12/2011   HGB 11.8* 06/12/2011   HCT 36.6 06/12/2011   PLT 537.0* 06/12/2011   GLUCOSE 100* 02/11/2011   CHOL 222* 07/29/2009   TRIG 203.0* 07/29/2009   HDL 66.90 07/29/2009   LDLDIRECT 78.3 07/29/2009   ALT 16 02/11/2011   AST 21 02/11/2011   NA 138 02/11/2011   K 4.3 02/11/2011   CL 102 02/11/2011   CREATININE 1.1 02/11/2011   BUN 13 02/11/2011   CO2 27 02/11/2011   TSH 2.81 07/29/2009    ECG: sinus @ 85 bpm - nonsp T wave changes - no arrythmia     Assessment & Plan:  CPX/v70.0 - Patient has been counseled on age-appropriate routine health concerns for screening and prevention. These are reviewed and up-to-date. Immunizations are up-to-date or declined. Labs ordered and ECG reviewed.

## 2012-08-01 ENCOUNTER — Other Ambulatory Visit (INDEPENDENT_AMBULATORY_CARE_PROVIDER_SITE_OTHER): Payer: BC Managed Care – PPO

## 2012-08-01 DIAGNOSIS — Z136 Encounter for screening for cardiovascular disorders: Secondary | ICD-10-CM

## 2012-08-01 DIAGNOSIS — Z0000 Encounter for general adult medical examination without abnormal findings: Secondary | ICD-10-CM

## 2012-08-01 LAB — URINALYSIS, ROUTINE W REFLEX MICROSCOPIC
Specific Gravity, Urine: >=1.03 (ref 1.000–1.030)
Urine Glucose: NEGATIVE
pH: 5 (ref 5.0–8.0)

## 2012-08-01 LAB — CBC WITH DIFFERENTIAL/PLATELET
Basophils Relative: 1.6 % (ref 0.0–3.0)
HCT: 39 % (ref 36.0–46.0)
Hemoglobin: 12.4 g/dL (ref 12.0–15.0)
Lymphocytes Relative: 20.4 % (ref 12.0–46.0)
MCHC: 32 g/dL (ref 30.0–36.0)
Monocytes Relative: 5 % (ref 3.0–12.0)
Neutro Abs: 11.6 10*3/uL — ABNORMAL HIGH (ref 1.4–7.7)
RBC: 5.16 Mil/uL — ABNORMAL HIGH (ref 3.87–5.11)

## 2012-08-01 LAB — LDL CHOLESTEROL, DIRECT: Direct LDL: 97.2 mg/dL

## 2012-08-01 LAB — BASIC METABOLIC PANEL
CO2: 24 mEq/L (ref 19–32)
Calcium: 9.3 mg/dL (ref 8.4–10.5)
GFR: 39.94 mL/min — ABNORMAL LOW (ref >60.00)
Potassium: 4.3 mEq/L (ref 3.5–5.1)
Sodium: 137 mEq/L (ref 135–145)

## 2012-08-01 LAB — LIPID PANEL
Cholesterol: 289 mg/dL — ABNORMAL HIGH (ref 0–200)
HDL: 70 mg/dL (ref >39.00)
Total CHOL/HDL Ratio: 4
Triglycerides: 396 mg/dL — ABNORMAL HIGH (ref 0.0–149.0)

## 2012-08-01 LAB — HEPATIC FUNCTION PANEL
AST: 17 U/L (ref 0–37)
Albumin: 4.2 g/dL (ref 3.5–5.2)

## 2012-10-05 ENCOUNTER — Telehealth: Payer: Self-pay | Admitting: Internal Medicine

## 2012-10-05 ENCOUNTER — Ambulatory Visit (INDEPENDENT_AMBULATORY_CARE_PROVIDER_SITE_OTHER): Payer: BC Managed Care – PPO | Admitting: Internal Medicine

## 2012-10-05 ENCOUNTER — Encounter: Payer: Self-pay | Admitting: Internal Medicine

## 2012-10-05 VITALS — BP 112/84 | HR 84 | Temp 98.1°F

## 2012-10-05 DIAGNOSIS — J45909 Unspecified asthma, uncomplicated: Secondary | ICD-10-CM

## 2012-10-05 DIAGNOSIS — J45901 Unspecified asthma with (acute) exacerbation: Secondary | ICD-10-CM

## 2012-10-05 MED ORDER — METHYLPREDNISOLONE ACETATE 80 MG/ML IJ SUSP
80.0000 mg | Freq: Once | INTRAMUSCULAR | Status: AC
  Administered 2012-10-05: 80 mg via INTRAMUSCULAR

## 2012-10-05 MED ORDER — AZITHROMYCIN 250 MG PO TABS: ORAL_TABLET | ORAL | Status: AC

## 2012-10-05 MED ORDER — HYDROCOD POLST-CHLORPHEN POLST 10-8 MG/5ML PO LQCR: 5.0000 mL | Freq: Two times a day (BID) | ORAL | Status: DC | PRN

## 2012-10-05 MED ORDER — PREDNISONE (PAK) 10 MG PO TABS: 10.0000 mg | ORAL_TABLET | ORAL | Status: AC

## 2012-10-05 NOTE — Patient Instructions (Signed)
It was good to see you today. Medrol 80mg  shot and Xopinex neb given in office today Prednisone taper over next 12 days, Zpak and Tussionex cough syrup to use at night - Your prescription(s) have been submitted to your pharmacy. Please take as directed and contact our office if you believe you are having problem(s) with the medication(s). Plan hydration, rest and Tylenol as needed Schedule followup with Dr. Maple Hudson as needed

## 2012-10-05 NOTE — Progress Notes (Signed)
  Subjective:    HPI  complains of cough symptoms  Onset yesterday, rapidly progressively worse symptoms  associated with mild sore throat, mild headache -  Also myalgias, sinus pressure and mild-mod chest congestion but no fever or sputum No relief with OTC meds and usual pulmonary medications ?Precipitated by sick contacts  Past Medical History  Diagnosis Date  . MIGRAINE HEADACHE   . Irritable bowel syndrome   . ASTHMA   . GERD   . BREAST CANCER, HX OF 11/1979    Review of Systems Constitutional: No night sweats, no unexpected weight change Pulmonary: No pleurisy or hemoptysis Cardiovascular: No chest pain or palpitations     Objective:   Physical Exam BP 112/84  Pulse 84  Temp 98.1 F (36.7 C) (Oral)  SpO2 84% GEN: mildly ill appearing, hoarse and audible chest congestion, coughing spasms HENT: NCAT, no sinus tenderness bilaterally, nares with clear discharge, oropharynx mild erythema, no exudate Eyes: Vision grossly intact, no conjunctivitis Lungs: exp wheeze and rhonchi bilaterally, no increased work of breathing at rest Cardiovascular: Regular rate and rhythm, no bilateral edema      Assessment & Plan:   acute asthmatic exacerbation with bronchitis Cough, related bronchospasm with underlying asthma   IM medrol 80mg  and Xopinex neb in office today> O2 sat improved to 97% on RA s/p neb  Empiric Zpak given other med intolerance and allergies Prescription cough suppression and steroids x 12d taper - new prescriptions done - Continue routine asthma meds as ongoing - advised follow up with pulm on same -  Previously advised to keep chronic abx/pred rx "on hand"? Defer to pulm to determine same Symptomatic care with Tylenol, hydration and rest - salt gargle advised as needed

## 2012-10-05 NOTE — Telephone Encounter (Signed)
Patient Information:  Caller Name: Dulcemaria  Phone: [phone]  Patient: [name] [name], [name] [name]  Gender: Female  DOB: 10/09/1950  Age: 62 Years  PCP: Rene Paci (Adults only)  Office Follow Up:  Does the office need to follow up with this patient?: No  Instructions For The Office: N/A  RN Note:  Called for antibiotic and cough medication. Cough "feels like this is bronchitis," not asthma.  Mild chest tightness present with mild wheezing.  Some relief with Xopenex.   Symptoms  Reason For Call & Symptoms: Cough with mild sore throat and  exposure to Influenza at work; requesting Zithromax and cough medication. Afebrile.  Reviewed Health History In EMR: Yes  Reviewed Medications In EMR: Yes  Reviewed Allergies In EMR: Yes  Reviewed Surgeries / Procedures: Yes  Date of Onset of Symptoms: 10/04/2012  Treatments Tried: Xopenex once daily, tylenol  Treatments Tried Worked: No  Guideline(s) Used:  Influenza Exposure  Influenza - Seasonal  Cough  Asthma Attack  Disposition Per Guideline:   See Today in Office  Reason For Disposition Reached:   Asthma medicine (nebulizer or inhaler) is needed more frequently than every 4 hours to keep you comfortable  Advice Given:  Quick-Relief Asthma Medicine:   Start your quick-relief medicine (e.g., albuterol, salbutamol) at the first sign of any coughing or shortness of breath (don't wait for wheezing). Use your inhaler (2 puffs each time) or nebulizer every 4 hours. Continue the quick-relief medicine until you have not wheezed or coughed for 48 hours.  The best "cough medicine" for an adult with asthma is always the asthma medicine (Note: Don't use cough suppressants, but cough drops may help a tickly cough).  Drinking Liquids:  Try to drink normal amount of liquids (e.g., water). Being adequately hydrated makes it easier to cough up the sticky lung mucus.  Humidifier:   If the air is dry, use a cool mist humidifier to prevent drying of the  upper airway.  Work with Your Doctor:  There is no cure for asthma, but you can take charge and learn to control it. The best way to take charge of asthma is to work with your doctor (over many months) to find the right controller (preventive) medicine so your asthma is under control. If you keep having asthma attacks, then the asthma is not under control. People can die from asthma if they do not take it seriously and work with a doctor to control it.  Call Back If:  Inhaled asthma medicine (nebulizer or inhaler) is needed more often than every 4 hours  Wheezing has not completely cleared after 5 days  You become worse.  Appointment Scheduled:  10/05/2012 13:30:00 Appointment Scheduled Provider:  Rene Paci (Adults only)

## 2012-11-29 ENCOUNTER — Encounter: Payer: Self-pay | Admitting: Internal Medicine

## 2012-11-29 ENCOUNTER — Other Ambulatory Visit: Payer: BC Managed Care – PPO

## 2012-11-29 ENCOUNTER — Ambulatory Visit (INDEPENDENT_AMBULATORY_CARE_PROVIDER_SITE_OTHER): Payer: BC Managed Care – PPO | Admitting: Internal Medicine

## 2012-11-29 ENCOUNTER — Ambulatory Visit (INDEPENDENT_AMBULATORY_CARE_PROVIDER_SITE_OTHER)
Admission: RE | Admit: 2012-11-29 | Discharge: 2012-11-29 | Disposition: A | Discharge status: Home / Self Care | Payer: BC Managed Care – PPO | Source: Ambulatory Visit | Attending: Internal Medicine | Admitting: Internal Medicine

## 2012-11-29 VITALS — BP 118/64 | HR 103 | Ht 64.0 in | Wt 152.8 lb

## 2012-11-29 DIAGNOSIS — J45909 Unspecified asthma, uncomplicated: Secondary | ICD-10-CM

## 2012-11-29 DIAGNOSIS — Z9289 Personal history of other medical treatment: Secondary | ICD-10-CM

## 2012-11-29 DIAGNOSIS — Z9189 Other specified personal risk factors, not elsewhere classified: Secondary | ICD-10-CM

## 2012-11-29 MED ORDER — MOMETASONE FURO-FORMOTEROL FUM 200-5 MCG/ACT IN AERO: 2.0000 | INHALATION_SPRAY | Freq: Two times a day (BID) | RESPIRATORY_TRACT | Status: DC

## 2012-11-29 NOTE — Progress Notes (Signed)
Patient ID: [name] [name], female    DOB: 09/07/1951, 62 y.o.   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 She comes now to re-establish after ER visit. PCP Dr Felicity Coyer. Husband is here.  She was feeling well 2 days ago. As she walked from outdoor heat into [employer] building as she often does, she experienced acute chest tightness, cough. She had just spent 2 hours in a patient's house which had recently been sprayed for insects. She was treated at [facility] ER and discharged taking prednisone 20 mg daily x 3 days. Still feels tight, not fully broken. Denies nasal symptoms with this episode. No chest pain, fever, sputum. Some dry cough. Does not usually feel reflux often. Has had 2-3 episodes of bronchitis this year, treated prednisone. Used Epipen in March after angioedema from ?shrimp. Medical hx includes GERD, IBS, hx breast cancer. Reports med allergies include albuterol, augmentin, morphine, sulfa, Singulair.  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 She comes now to re-establish after ER visit. PCP Dr Felicity Coyer. Husband is here.  2 episodes of bronchitis since Christmas. Some increased shortness of breath today but nothing really acute. Strong odors or trigger. She is really focused on avoiding any medication that might have a sulfite and I tried to explain the difference between sulfa antibiotics, sulfite preservatives and molecular sulfur. She expects these to cause shortness of breath, swelling and wheeze. She is taking Dulera 100 twice daily and Xopenex HFA about once daily.  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 She comes now to re-establish after ER visit. PCP Dr Felicity Coyer. Husband here FOLLOWS FOR: was seen at ER/ [facility] back in November 2013 for asthma flare up. Now his nebulizer  machine with Xopenex. Uses Dulera intermittently which we discussed. Triggers include respiratory irritants including cigarette smoke when she makes home visits for her job. No known exposure to TB but TB check required for her job. She has a history of an immediate intense local reaction to PPD skin test which she blames on sulfa allergy Asthma control now good.  ROS-see HPI Constitutional:   No-   weight loss, night sweats, fevers, chills, fatigue, lassitude. HEENT:   No-  headaches, difficulty swallowing, tooth/dental problems, sore throat,       No-  sneezing, itching, ear ache, nasal congestion, post nasal drip,  CV:  No-   chest pain, orthopnea, PND, swelling in lower extremities, anasarca,  dizziness, palpitations Resp: No-   shortness of breath with exertion or at rest.              No-   productive cough,  No non-productive cough,  No- coughing up of blood.              No-   change in color of mucus.  No- wheezing.   Skin: No-   rash or lesions. GI:  No-   heartburn, indigestion, abdominal pain, nausea, vomiting,  GU:  MS:  No-   joint pain or swelling. Neuro-     nothing unusual Psych:  No- change in mood or affect. No depression or anxiety.  No memory loss.   Objective:   Physical Exam General- Alert, Oriented, Affect-appropriate, Distress- none acute Skin- rash-none, lesions- none, excoriation- none Lymphadenopathy- none Head- atraumatic            Eyes- Gross  vision intact, PERRLA, conjunctivae clear secretions            Ears- Hearing, canals            Nose- Clear, No-Septal dev, mucus, polyps, erosion, perforation             Throat- Mallampati II , mucosa red , drainage- none, tonsils- atrophic ,active gag. Flinched at  neck exam. Neck- flexible , trachea midline, no stridor , thyroid nl, carotid no bruit Chest - symmetrical excursion , unlabored           Heart/CV- RRR , no murmur , no gallop  , no rub, nl s1 s2                           - JVD- none , edema- none,  stasis changes- none, varices- none           Lung- clear to P&A,wheeze- none, cough- none , dullness-none, rub- none           Chest wall-  Abd-  Br/ Gen/ Rectal- Not done, not indicated Extrem- cyanosis- none, clubbing, none, atrophy- none, strength- nl Neuro- grossly intact to observation

## 2012-11-29 NOTE — Patient Instructions (Addendum)
Script for [employer] refilled. This works best on a maintenance basis as a long term controller during times of need. Consider leaving it out on your bathroom sink and using it at least once daily at least in times when you feel a little unstable.  Order- lab  Quantiferon TB gold    Professional requirement for TB status annual documentation  Order- CXR  Dx asthma

## 2012-11-30 NOTE — Progress Notes (Signed)
Quick Note:  Pt aware of results. ______ 

## 2012-12-02 LAB — QUANTIFERON TB GOLD ASSAY (BLOOD): Mitogen value: 1.55 IU/mL

## 2012-12-03 DIAGNOSIS — Z9289 Personal history of other medical treatment: Secondary | ICD-10-CM | POA: Insufficient documentation

## 2012-12-03 NOTE — Assessment & Plan Note (Signed)
Good control now with interval use of Dulera maintenance controller. We discussed this. She does have a nebulizer machine with Xopenex. Plan-refill Elwin Sleight

## 2012-12-03 NOTE — Assessment & Plan Note (Signed)
Plan-Quantiferon TB gold test for detection of previous TB exposure- required for her job.

## 2012-12-15 ENCOUNTER — Other Ambulatory Visit: Payer: Self-pay | Admitting: Internal Medicine

## 2012-12-15 NOTE — Telephone Encounter (Signed)
Faxed script bck to [facility].../lmb 

## 2013-01-10 ENCOUNTER — Telehealth: Payer: Self-pay

## 2013-01-10 DIAGNOSIS — Z1239 Encounter for other screening for malignant neoplasm of breast: Secondary | ICD-10-CM

## 2013-01-10 NOTE — Telephone Encounter (Signed)
Phone call from pt stating she needs another referral put in for mammogram. She states she was to have this done last November but things in her personal life kept coming about. Please advise.

## 2013-01-11 NOTE — Telephone Encounter (Signed)
done

## 2013-01-11 NOTE — Telephone Encounter (Signed)
Pt notified referral is in and she will get a phone call

## 2013-02-01 ENCOUNTER — Ambulatory Visit
Admission: RE | Admit: 2013-02-01 | Discharge: 2013-02-01 | Disposition: A | Discharge status: Home / Self Care | Payer: BC Managed Care – PPO | Source: Ambulatory Visit | Attending: Internal Medicine | Admitting: Internal Medicine

## 2013-02-01 ENCOUNTER — Other Ambulatory Visit: Payer: Self-pay | Admitting: Internal Medicine

## 2013-02-01 DIAGNOSIS — Z1239 Encounter for other screening for malignant neoplasm of breast: Secondary | ICD-10-CM

## 2013-02-13 ENCOUNTER — Other Ambulatory Visit: Payer: Self-pay | Admitting: *Deleted

## 2013-02-13 NOTE — Telephone Encounter (Signed)
Pt is having an asthma attack today, she has been exposed to Albuterol which she is allergic to. Pt is requesting rx for Prednisone taper.

## 2013-02-14 MED ORDER — PREDNISONE (PAK) 10 MG PO TABS: 10.0000 mg | ORAL_TABLET | ORAL | Status: DC

## 2013-02-14 NOTE — Telephone Encounter (Signed)
Rx for Pred Taper sent to [facility].

## 2013-02-14 NOTE — Telephone Encounter (Signed)
Ok - may send erx to preferred pharmacy - thanks

## 2013-03-09 ENCOUNTER — Other Ambulatory Visit: Payer: Self-pay | Admitting: *Deleted

## 2013-03-09 ENCOUNTER — Encounter: Payer: Self-pay | Admitting: Internal Medicine

## 2013-03-09 ENCOUNTER — Ambulatory Visit (INDEPENDENT_AMBULATORY_CARE_PROVIDER_SITE_OTHER): Payer: BC Managed Care – PPO | Admitting: Internal Medicine

## 2013-03-09 VITALS — BP 124/82 | HR 104 | Temp 98.4°F

## 2013-03-09 DIAGNOSIS — J440 Chronic obstructive pulmonary disease with acute lower respiratory infection: Secondary | ICD-10-CM

## 2013-03-09 DIAGNOSIS — J4521 Mild intermittent asthma with (acute) exacerbation: Secondary | ICD-10-CM

## 2013-03-09 MED ORDER — PROMETHAZINE HCL 25 MG PO TABS: 25.0000 mg | ORAL_TABLET | Freq: Four times a day (QID) | ORAL | Status: DC | PRN

## 2013-03-09 MED ORDER — AZITHROMYCIN 250 MG PO TABS: ORAL_TABLET | ORAL | Status: DC

## 2013-03-09 MED ORDER — METHYLPREDNISOLONE ACETATE 80 MG/ML IJ SUSP
80.0000 mg | Freq: Once | INTRAMUSCULAR | Status: AC
  Administered 2013-03-09: 80 mg via INTRAMUSCULAR

## 2013-03-09 MED ORDER — CYCLOBENZAPRINE HCL 10 MG PO TABS: 10.0000 mg | ORAL_TABLET | Freq: Three times a day (TID) | ORAL | Status: DC | PRN

## 2013-03-09 NOTE — Patient Instructions (Signed)

## 2013-03-09 NOTE — Progress Notes (Signed)
HPI  Pt presents to the clinic today with c/o cold symptoms x 5 days. The worse part in the unrelenting cough. It is worse at night. She does have a history of asthma. She is on inhalers as well as a xopenex nebulizer. She reports she has been using these more frequently. She does have associated headache, fatigue and sore throat. She denies fever, chills or body aches. She has had sick contacts.  Review of Systems      Past Medical History  Diagnosis Date  . MIGRAINE HEADACHE   . Irritable bowel syndrome   . ASTHMA   . GERD   . BREAST CANCER, HX OF 11/1979    Family History  Problem Relation Age of Onset  . Breast cancer Mother   . Uterine cancer Mother   . Coronary artery disease Mother   . Breast cancer Sister     Endometriosis    History   Social History  . Marital Status: Widowed    Spouse Name: N/A    Number of Children: N/A  . Years of Education: N/A   Occupational History  . Not on file.   Social History Main Topics  . Smoking status: Never Smoker   . 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 Alcohol Use: Yes  . Drug Use: No  . Sexually Active: Not on file   Other Topics Concern  . 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 Albuterol Sulfate Anaphylaxis  . Amoxicillin-Pot Clavulanate     REACTION: ANAPHYLACTIC REACTION  . Fluzone     REACTION: ANAPHYLACTIC  . Influenza Vaccines Anaphylaxis  . Morphine     REACTION: ANAPHYLACTIC REACTION  . Povidone     REACTION: HIVES/ANAPHYLACTIC WITH INJECTABLE DYE  . Sulfonamide Derivatives     REACTION: ANAPHYLACTIC REACTION  . Ciprofloxacin     Itchy palms  . Hyoscyamine Sulfate Hives  . Meperidine Hcl     REACTION: N\T\V  . Other     Mucomist-unable to breathe     Constitutional: Positive headache, fatigue. Denies fever or abrupt weight changes.   HEENT:  Positive sore throat. Denies eye redness, eye pain, pressure behind the eyes, facial pain, nasal congestion, ear pain, ringing in the ears, wax buildup, runny nose or bloody nose. Respiratory: Positive cough. Denies difficulty breathing or shortness of breath.  Cardiovascular: Denies chest pain, chest tightness, palpitations or swelling in the hands or feet.   No other specific complaints in a complete review of systems (except as listed in HPI above).  Objective:   BP 124/82  Pulse 104  Temp(Src) 98.4 F (36.9 C) (Oral)  SpO2 97% Wt Readings from Last 3 Encounters:  11/29/12 152 lb 12.8 oz (69.31 kg)  07/25/12 151 lb 12.8 oz (68.856 kg)  11/27/11 155 lb 6.4 oz (70.489 kg)     General: Appears her stated age, well developed, well nourished in NAD. HEENT: Head: normal shape and size; Eyes: sclera white, no icterus, conjunctiva pink, PERRLA and EOMs intact; Ears: Tm's gray and intact, normal light reflex; Nose: mucosa pink and moist, septum midline; Throat/Mouth: + PND. Teeth present, mucosa erythematous and moist, no exudate noted, no lesions or ulcerations noted.  Neck: Mild cervical lymphadenopathy. Neck supple, trachea midline. No massses, lumps or thyromegaly present.  Cardiovascular: Normal rate and rhythm. S1,S2 noted.  No murmur, rubs or gallops noted. No JVD or BLE edema. No carotid bruits noted. Pulmonary/Chest: Normal effort and scatterd rhonchi throughout and bilateral expiratory wheeze. No respiratory distress.      Assessment & Plan:   Acute bronchites, secondary to asthma exacerbation:  Get some rest and drink plenty of water Do salt water gargles for the sore throat eRx for Azithromax x 5 days eRx for Hycodan cough syrup 80 mg Depo IM today Pt declines pred taper  RTC as needed or if symptoms persist.

## 2013-03-09 NOTE — Addendum Note (Signed)
Addended by: Carin Primrose on: 03/09/2013 10:46 AM   Modules accepted: Orders

## 2013-03-13 ENCOUNTER — Telehealth: Payer: Self-pay | Admitting: *Deleted

## 2013-03-13 ENCOUNTER — Encounter: Payer: Self-pay | Admitting: *Deleted

## 2013-03-13 NOTE — Telephone Encounter (Signed)
Ok to generate and i will sign 

## 2013-03-13 NOTE — Telephone Encounter (Signed)
Pt was seen last week by NP for bronchitis and was given a work note for Saturday and Sunday. Pt would like work note to be extended to include today as well-please advise.

## 2013-03-13 NOTE — Telephone Encounter (Signed)
Pt informed letter ready, placed upfront in cabinet, ready for pickup.

## 2013-03-22 ENCOUNTER — Other Ambulatory Visit: Payer: Self-pay | Admitting: Internal Medicine

## 2013-03-22 MED ORDER — MOMETASONE FURO-FORMOTEROL FUM 200-5 MCG/ACT IN AERO: 2.0000 | INHALATION_SPRAY | Freq: Two times a day (BID) | RESPIRATORY_TRACT | Status: DC

## 2013-05-22 ENCOUNTER — Other Ambulatory Visit: Payer: Self-pay | Admitting: *Deleted

## 2013-05-22 MED ORDER — ZOLPIDEM TARTRATE 10 MG PO TABS: ORAL_TABLET | ORAL | Status: DC

## 2013-05-22 NOTE — Telephone Encounter (Signed)
[facility]  

## 2013-07-14 ENCOUNTER — Ambulatory Visit (INDEPENDENT_AMBULATORY_CARE_PROVIDER_SITE_OTHER): Payer: BC Managed Care – PPO | Admitting: Internal Medicine

## 2013-07-14 ENCOUNTER — Encounter: Payer: Self-pay | Admitting: Internal Medicine

## 2013-07-14 ENCOUNTER — Encounter: Payer: Self-pay | Admitting: *Deleted

## 2013-07-14 VITALS — BP 102/68 | HR 98 | Temp 97.7°F | Wt 140.2 lb

## 2013-07-14 DIAGNOSIS — M5416 Radiculopathy, lumbar region: Secondary | ICD-10-CM

## 2013-07-14 DIAGNOSIS — IMO0002 Reserved for concepts with insufficient information to code with codable children: Secondary | ICD-10-CM

## 2013-07-14 MED ORDER — IBUPROFEN 800 MG PO TABS: 800.0000 mg | ORAL_TABLET | Freq: Three times a day (TID) | ORAL | Status: DC | PRN

## 2013-07-14 MED ORDER — PREDNISONE (PAK) 10 MG PO TABS: 10.0000 mg | ORAL_TABLET | ORAL | Status: DC

## 2013-07-14 MED ORDER — CYCLOBENZAPRINE HCL 10 MG PO TABS: 10.0000 mg | ORAL_TABLET | Freq: Three times a day (TID) | ORAL | Status: DC | PRN

## 2013-07-14 NOTE — Progress Notes (Signed)
Subjective:    Patient ID: [name] [name], female    DOB: 05/19/1951, 62 y.o.   MRN: [id number]  Back Pain This is a new problem. The current episode started yesterday. The problem occurs constantly. The problem has been gradually improving since onset. The pain is present in the lumbar spine, gluteal and sacro-iliac. The quality of the pain is described as burning and shooting. The pain radiates to the left thigh and left knee. The pain is at a severity of 6/10. The pain is moderate. The pain is the same all the time. The symptoms are aggravated by sitting. Stiffness is present all day. Associated symptoms include leg pain. Pertinent negatives include no abdominal pain, bladder incontinence, bowel incontinence, headaches, numbness, pelvic pain, perianal numbness or tingling.  Leg Pain  Pertinent negatives include no numbness or tingling.   Past Medical History  Diagnosis Date  . MIGRAINE HEADACHE   . Irritable bowel syndrome   . ASTHMA   . GERD   . BREAST CANCER, HX OF 11/1979    Review of Systems  Gastrointestinal: Negative for abdominal pain and bowel incontinence.  Genitourinary: Negative for bladder incontinence and pelvic pain.  Musculoskeletal: Positive for back pain.  Neurological: Negative for tingling, numbness and headaches.       Objective:   Physical Exam BP 102/68  Pulse 98  Temp(Src) 97.7 F (36.5 C) (Oral)  Wt 140 lb 3.2 oz (63.594 kg)  BMI 24.05 kg/m2  SpO2 98% Wt Readings from Last 3 Encounters:  07/14/13 140 lb 3.2 oz (63.594 kg)  11/29/12 152 lb 12.8 oz (69.31 kg)  07/25/12 151 lb 12.8 oz (68.856 kg)   Constitutional: She appears well-developed and well-nourished. No distress. Cardiovascular: Normal rate, regular rhythm and normal heart sounds.  No murmur heard. No BLE edema. Pulmonary/Chest: Effort normal and breath sounds normal. No respiratory distress. She has no wheezes.  Musculoskeletal: Back: full range of motion of thoracic and lumbar spine. Non  tender to palpation. Negative straight leg raise. DTR's are symmetrically intact. Sensation intact in all dermatomes of the lower extremities. Full strength to manual muscle testing. patient is able to heel toe walk without difficulty and ambulates with antalgic gait. Neurological: She is alert and oriented to person, place, and time. No cranial nerve deficit. Coordination, balance, strength, speech and gait are normal.  Skin: Skin is warm and dry. No rash noted. No erythema.  Psychiatric: She has a normal mood and affect. Her behavior is normal. Judgment and thought content normal.   Lab Results  Component Value Date   WBC 16.0* 08/01/2012   HGB 12.4 08/01/2012   HCT 39.0 08/01/2012   PLT 548.0* 08/01/2012   GLUCOSE 138* 08/01/2012   CHOL 289* 08/01/2012   TRIG 396.0* 08/01/2012   HDL 70.00 08/01/2012   LDLDIRECT 97.2 08/01/2012   ALT 15 08/01/2012   AST 17 08/01/2012   NA 137 08/01/2012   K 4.3 08/01/2012   CL 105 08/01/2012   CREATININE 1.4* 08/01/2012   BUN 19 08/01/2012   CO2 24 08/01/2012   TSH 3.15 08/01/2012       Assessment & Plan:   Acute L4 lumbar radicular pain -ongoing <24h -  Overnight, pain changed from 8 to 6.5/10 with ibuprofen and flexeril  tx with pred pak x 12d and continue flexeril Rx and refills provided  Back exercises provided, begin 72h after improved  If not improved in next 2 weeks, call for MRI referral +/- NS (requests [facility] if needed)

## 2013-07-14 NOTE — Patient Instructions (Addendum)
It was good to see you today.  Prednisone over next 12 days  Refill on other meds as requested  Your prescription(s) have been submitted to your pharmacy. Please take as directed and contact our office if you believe you are having problem(s) with the medication(s).  Call if unimproved in next 2 weeks, sooner if worse  Radicular Pain Radicular pain in either the arm or leg is usually from a bulging or herniated disk in the spine. A piece of the herniated disk may press against the nerves as the nerves exit the spine. This causes pain which is felt at the tips of the nerves down the arm or leg. Other causes of radicular pain may include:  Fractures.  Heart disease.  Cancer.  An abnormal and usually degenerative state of the nervous system or nerves (neuropathy). Diagnosis may require CT or MRI scanning to determine the primary cause.  Nerves that start at the neck (nerve roots) may cause radicular pain in the outer shoulder and arm. It can spread down to the thumb and fingers. The symptoms vary depending on which nerve root has been affected. In most cases radicular pain improves with conservative treatment. Neck problems may require physical therapy, a neck collar, or cervical traction. Treatment may take many weeks, and surgery may be considered if the symptoms do not improve.  Conservative treatment is also recommended for sciatica. Sciatica causes pain to radiate from the lower back or buttock area down the leg into the foot. Often there is a history of back problems. Most patients with sciatica are better after 2 to 4 weeks of rest and other supportive care. Short term bed rest can reduce the disk pressure considerably. Sitting, however, is not a good position since this increases the pressure on the disk. You should avoid bending, lifting, and all other activities which make the problem worse. Traction can be used in severe cases. Surgery is usually reserved for patients who do not improve  within the first months of treatment. Only take over-the-counter or prescription medicines for pain, discomfort, or fever as directed by your caregiver. Narcotics and muscle relaxants may help by relieving more severe pain and spasm and by providing mild sedation. Cold or massage can give significant relief. Spinal manipulation is not recommended. It can increase the degree of disc protrusion. Epidural steroid injections are often effective treatment for radicular pain. These injections deliver medicine to the spinal nerve in the space between the protective covering of the spinal cord and back bones (vertebrae). Your caregiver can give you more information about steroid injections. These injections are most effective when given within two weeks of the onset of pain.  You should see your caregiver for follow up care as recommended. A program for neck and back injury rehabilitation with stretching and strengthening exercises is an important part of management.  SEEK IMMEDIATE MEDICAL CARE IF:  You develop increased pain, weakness, or numbness in your arm or leg.  You develop difficulty with bladder or bowel control.  You develop abdominal pain. Document Released: 10/08/2004 Document Revised: 11/23/2011 Document Reviewed: 12/24/2008 [facility] Patient Information 2014 [place], [place]. Back Exercises Back exercises help treat and prevent back injuries. The goal is to increase your strength in your belly (abdominal) and back muscles. These exercises can also help with flexibility. Start these exercises when told by your doctor. HOME CARE Back exercises include: Pelvic Tilt.  Lie on your back with your knees bent. Tilt your pelvis until the lower part of your back  is against the floor. Hold this position 5 to 10 sec. Repeat this exercise 5 to 10 times. Knee to Chest.  Pull 1 knee up against your chest and hold for 20 to 30 seconds. Repeat this with the other knee. This may be done with the other leg  straight or bent, whichever feels better. Then, pull both knees up against your chest. Sit-Ups or Curl-Ups.  Bend your knees 90 degrees. Start with tilting your pelvis, and do a partial, slow sit-up. Only lift your upper half 30 to 45 degrees off the floor. Take at least 2 to 3 seonds for each sit-up. Do not do sit-ups with your knees out straight. If partial sit-ups are difficult, simply do the above but with only tightening your belly (abdominal) muscles and holding it as told. Hip-Lift.  Lie on your back with your knees flexed 90 degrees. Push down with your feet and shoulders as you raise your hips 2 inches off the floor. Hold for 10 seconds, repeat 5 to 10 times. Back Arches.  Lie on your stomach. Prop yourself up on bent elbows. Slowly press on your hands, causing an arch in your low back. Repeat 3 to 5 times. Shoulder-Lifts.  Lie face down with arms beside your body. Keep hips and belly pressed to floor as you slowly lift your head and shoulders off the floor. Do not overdo your exercises. Be careful in the beginning. Exercises may cause you some mild back discomfort. If the pain lasts for more than 15 minutes, stop the exercises until you see your doctor. Improvement with exercise for back problems is slow.  Document Released: 10/03/2010 Document Revised: 11/23/2011 Document Reviewed: 07/02/2011 [facility] Patient Information 2014 [place], [place]. Back Exercises Back exercises help treat and prevent back injuries. The goal is to increase your strength in your belly (abdominal) and back muscles. These exercises can also help with flexibility. Start these exercises when told by your doctor. HOME CARE Back exercises include: Pelvic Tilt.  Lie on your back with your knees bent. Tilt your pelvis until the lower part of your back is against the floor. Hold this position 5 to 10 sec. Repeat this exercise 5 to 10 times. Knee to Chest.  Pull 1 knee up against your chest and hold for 20 to 30  seconds. Repeat this with the other knee. This may be done with the other leg straight or bent, whichever feels better. Then, pull both knees up against your chest. Sit-Ups or Curl-Ups.  Bend your knees 90 degrees. Start with tilting your pelvis, and do a partial, slow sit-up. Only lift your upper half 30 to 45 degrees off the floor. Take at least 2 to 3 seonds for each sit-up. Do not do sit-ups with your knees out straight. If partial sit-ups are difficult, simply do the above but with only tightening your belly (abdominal) muscles and holding it as told. Hip-Lift.  Lie on your back with your knees flexed 90 degrees. Push down with your feet and shoulders as you raise your hips 2 inches off the floor. Hold for 10 seconds, repeat 5 to 10 times. Back Arches.  Lie on your stomach. Prop yourself up on bent elbows. Slowly press on your hands, causing an arch in your low back. Repeat 3 to 5 times. Shoulder-Lifts.  Lie face down with arms beside your body. Keep hips and belly pressed to floor as you slowly lift your head and shoulders off the floor. Do not overdo your exercises. Be careful in  the beginning. Exercises may cause you some mild back discomfort. If the pain lasts for more than 15 minutes, stop the exercises until you see your doctor. Improvement with exercise for back problems is slow.  Document Released: 10/03/2010 Document Revised: 11/23/2011 Document Reviewed: 07/02/2011 [facility] Patient Information 2014 [place], [place].

## 2013-08-23 ENCOUNTER — Ambulatory Visit (INDEPENDENT_AMBULATORY_CARE_PROVIDER_SITE_OTHER): Payer: BC Managed Care – PPO | Admitting: Internal Medicine

## 2013-08-23 ENCOUNTER — Encounter: Payer: Self-pay | Admitting: Internal Medicine

## 2013-08-23 ENCOUNTER — Other Ambulatory Visit (INDEPENDENT_AMBULATORY_CARE_PROVIDER_SITE_OTHER): Payer: BC Managed Care – PPO

## 2013-08-23 VITALS — BP 122/80 | HR 84 | Temp 98.3°F | Wt 139.1 lb

## 2013-08-23 DIAGNOSIS — Z1211 Encounter for screening for malignant neoplasm of colon: Secondary | ICD-10-CM

## 2013-08-23 DIAGNOSIS — Z0000 Encounter for general adult medical examination without abnormal findings: Secondary | ICD-10-CM

## 2013-08-23 LAB — BASIC METABOLIC PANEL
Calcium: 9.8 mg/dL (ref 8.4–10.5)
GFR: 48.42 mL/min — ABNORMAL LOW (ref >60.00)
Glucose, Bld: 101 mg/dL — ABNORMAL HIGH (ref 70–99)
Potassium: 4.1 mEq/L (ref 3.5–5.1)
Sodium: 141 mEq/L (ref 135–145)

## 2013-08-23 LAB — CBC WITH DIFFERENTIAL/PLATELET
Basophils Relative: 1 % (ref 0.0–3.0)
Eosinophils Relative: 2.3 % (ref 0.0–5.0)
HCT: 40.5 % (ref 36.0–46.0)
Hemoglobin: 13.3 g/dL (ref 12.0–15.0)
Lymphocytes Relative: 31.8 % (ref 12.0–46.0)
Lymphs Abs: 2.4 10*3/uL (ref 0.7–4.0)
Monocytes Relative: 7.2 % (ref 3.0–12.0)
Neutro Abs: 4.4 10*3/uL (ref 1.4–7.7)
RBC: 4.77 Mil/uL (ref 3.87–5.11)
RDW: 15.6 % — ABNORMAL HIGH (ref 11.5–14.6)
WBC: 7.6 10*3/uL (ref 4.5–10.5)

## 2013-08-23 LAB — LIPID PANEL
Cholesterol: 241 mg/dL — ABNORMAL HIGH (ref 0–200)
HDL: 68.8 mg/dL (ref >39.00)
Triglycerides: 200 mg/dL — ABNORMAL HIGH (ref 0.0–149.0)
VLDL: 40 mg/dL (ref 0.0–40.0)

## 2013-08-23 LAB — TSH: TSH: 3.74 u[IU]/mL (ref 0.35–5.50)

## 2013-08-23 LAB — LDL CHOLESTEROL, DIRECT: Direct LDL: 104.8 mg/dL

## 2013-08-23 LAB — HEPATIC FUNCTION PANEL
ALT: 16 U/L (ref 0–35)
Total Bilirubin: 0.8 mg/dL (ref 0.3–1.2)
Total Protein: 7.8 g/dL (ref 6.0–8.3)

## 2013-08-23 MED ORDER — ZOLPIDEM TARTRATE 10 MG PO TABS: ORAL_TABLET | ORAL | Status: DC

## 2013-08-23 MED ORDER — MOMETASONE FURO-FORMOTEROL FUM 200-5 MCG/ACT IN AERO: 2.0000 | INHALATION_SPRAY | Freq: Two times a day (BID) | RESPIRATORY_TRACT | Status: DC

## 2013-08-23 NOTE — Progress Notes (Signed)
Subjective:    Patient ID: [name] [name], female    DOB: [birth year], 62 y.o.   MRN: [id number]  HPI  patient is here today for annual physical. Patient feels well overall.  Past Medical History  Diagnosis Date  . MIGRAINE HEADACHE   . Irritable bowel syndrome   . ASTHMA   . GERD   . BREAST CANCER, HX OF 11/1979     Family History  Problem Relation Age of Onset  . Breast cancer Mother   . Uterine cancer Mother   . Coronary artery disease Mother   . Breast cancer Sister     Endometriosis   History  Substance Use Topics  . Smoking status: Never Smoker   . 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 Alcohol Use: Yes     Review of Systems  Constitutional: Negative for fatigue and unexpected weight change.  Respiratory: Negative for cough, shortness of breath and wheezing.   Cardiovascular: Negative for chest pain, palpitations and leg swelling.  Gastrointestinal: Negative for nausea, abdominal pain and diarrhea.  Neurological: Negative for dizziness, weakness, light-headedness and headaches.  Psychiatric/Behavioral: Negative for dysphoric mood. The patient is not nervous/anxious.   All other systems reviewed and are negative.       Objective:   Physical Exam BP 122/80  Pulse 84  Temp(Src) 98.3 F (36.8 C) (Oral)  Wt 139 lb 1.9 oz (63.104 kg)  SpO2 96% Wt Readings from Last 3 Encounters:  08/23/13 139 lb 1.9 oz (63.104 kg)  07/14/13 140 lb 3.2 oz (63.594 kg)  11/29/12 152 lb 12.8 oz (69.31 kg)   Constitutional: She appears well-developed and well-nourished. No distress. spouse at side HENT: Head: Normocephalic and atraumatic. Ears: B TMs ok, no erythema or effusion; Nose: Nose normal. Mouth/Throat: Oropharynx is clear and moist. No oropharyngeal exudate.  Eyes: Conjunctivae and EOM are normal. Pupils are equal, round, and reactive to light. No scleral icterus.  Neck: Normal range of motion. Neck  supple. No JVD present. No thyromegaly present.  Cardiovascular: Normal rate, regular rhythm and normal heart sounds.  No murmur heard. No BLE edema. Pulmonary/Chest: Effort normal and breath sounds normal. No respiratory distress. She has no wheezes.  Abdominal: Soft. Bowel sounds are normal. She exhibits no distension. There is no tenderness. no masses Musculoskeletal: Normal range of motion, no joint effusions. No gross deformities Neurological: She is alert and oriented to person, place, and time. No cranial nerve deficit. Coordination, balance, strength, speech and gait are normal.  Skin: Skin is warm and dry. No rash noted. No erythema.  Psychiatric: She has a normal mood and affect. Her behavior is normal. Judgment and thought content normal.   Lab Results  Component Value Date   WBC 16.0* 08/01/2012   HGB 12.4 08/01/2012   HCT 39.0 08/01/2012   PLT 548.0* 08/01/2012   GLUCOSE 138* 08/01/2012   CHOL 289* 08/01/2012   TRIG 396.0* 08/01/2012   HDL 70.00 08/01/2012   LDLDIRECT 97.2 08/01/2012   ALT 15 08/01/2012   AST 17 08/01/2012   NA 137 08/01/2012   K 4.3 08/01/2012   CL 105 08/01/2012   CREATININE 1.4* 08/01/2012   BUN 19 08/01/2012   CO2 24 08/01/2012   TSH 3.15 08/01/2012        Assessment & Plan:   CPX/v70.0 - Patient has been counseled on age-appropriate routine health concerns for screening and prevention. These  are reviewed and up-to-date. Immunizations are up-to-date or declined. Labs ordered and reviewed.

## 2013-08-23 NOTE — Progress Notes (Signed)
Pre-visit discussion using our clinic review tool. No additional management support is needed unless otherwise documented below in the visit note.  

## 2013-08-23 NOTE — Patient Instructions (Addendum)
It was good to see you today.  We have reviewed your prior records including labs and tests today  Health Maintenance reviewed - cologaurd for colon cancer screening now -all other recommended immunizations and age-appropriate screenings are up-to-date or declined.  Test(s) ordered today. Your results will be released to MyChart (or called to you) after review, usually within 72hours after test completion. If any changes need to be made, you will be notified at that same time.  Medications reviewed and updated -no changes at this time. Refill on medication(s) as discussed today.  Please schedule followup in 12 months for annual exam/labs, call sooner if problems.     Health Maintenance, Female A healthy lifestyle and preventative care can promote health and wellness.  Maintain regular health, dental, and eye exams.  Eat a healthy diet. Foods like vegetables, fruits, whole grains, low-fat dairy products, and lean protein foods contain the nutrients you need without too many calories. Decrease your intake of foods high in solid fats, added sugars, and salt. Get information about a proper diet from your caregiver, if necessary.  Regular physical exercise is one of the most important things you can do for your health. Most adults should get at least 150 minutes of moderate-intensity exercise (any activity that increases your heart rate and causes you to sweat) each week. In addition, most adults need muscle-strengthening exercises on 2 or more days a week.   Maintain a healthy weight. The body mass index (BMI) is a screening tool to identify possible weight problems. It provides an estimate of body fat based on height and weight. Your caregiver can help determine your BMI, and can help you achieve or maintain a healthy weight. For adults 20 years and older:  A BMI below 18.5 is considered underweight.  A BMI of 18.5 to 24.9 is normal.  A BMI of 25 to 29.9 is considered overweight.  A BMI  of 30 and above is considered obese.  Maintain normal blood lipids and cholesterol by exercising and minimizing your intake of saturated fat. Eat a balanced diet with plenty of fruits and vegetables. Blood tests for lipids and cholesterol should begin at age 49 and be repeated every 5 years. If your lipid or cholesterol levels are high, you are over 50, or you are a high risk for heart disease, you may need your cholesterol levels checked more frequently.Ongoing high lipid and cholesterol levels should be treated with medicines if diet and exercise are not effective.  If you smoke, find out from your caregiver how to quit. If you do not use tobacco, do not start.  Lung cancer screening is recommended for adults aged 6 80 years who are at high risk for developing lung cancer because of a history of smoking. Yearly low-dose computed tomography (CT) is recommended for people who have at least a 30-pack-year history of smoking and are a current smoker or have quit within the past 15 years. A pack year of smoking is smoking an average of 1 pack of cigarettes a day for 1 year (for example: 1 pack a day for 30 years or 2 packs a day for 15 years). Yearly screening should continue until the smoker has stopped smoking for at least 15 years. Yearly screening should also be stopped for people who develop a health problem that would prevent them from having lung cancer treatment.  If you are pregnant, do not drink alcohol. If you are breastfeeding, be very cautious about drinking alcohol. If you are not  pregnant and choose to drink alcohol, do not exceed 1 drink per day. One drink is considered to be 12 ounces (355 mL) of beer, 5 ounces (148 mL) of wine, or 1.5 ounces (44 mL) of liquor.  Avoid use of street drugs. Do not share needles with anyone. Ask for help if you need support or instructions about stopping the use of drugs.  High blood pressure causes heart disease and increases the risk of stroke. Blood  pressure should be checked at least every 1 to 2 years. Ongoing high blood pressure should be treated with medicines, if weight loss and exercise are not effective.  If you are 13 to 64 years old, ask your caregiver if you should take aspirin to prevent strokes.  Diabetes screening involves taking a blood sample to check your fasting blood sugar level. This should be done once every 3 years, after age 21, if you are within normal weight and without risk factors for diabetes. Testing should be considered at a younger age or be carried out more frequently if you are overweight and have at least 1 risk factor for diabetes.  Breast cancer screening is essential preventative care for women. You should practice "breast self-awareness." This means understanding the normal appearance and feel of your breasts and may include breast self-examination. Any changes detected, no matter how small, should be reported to a caregiver. Women in their 9s and 30s should have a clinical breast exam (CBE) by a caregiver as part of a regular health exam every 1 to 3 years. After age 18, women should have a CBE every year. Starting at age 32, women should consider having a mammogram (breast X-ray) every year. Women who have a family history of breast cancer should talk to their caregiver about genetic screening. Women at a high risk of breast cancer should talk to their caregiver about having an MRI and a mammogram every year.  Breast cancer gene (BRCA)-related cancer risk assessment is recommended for women who have family members with BRCA-related cancers. BRCA-related cancers include breast, ovarian, tubal, and peritoneal cancers. Having family members with these cancers may be associated with an increased risk for harmful changes (mutations) in the breast cancer genes BRCA1 and BRCA2. Results of the assessment will determine the need for genetic counseling and BRCA1 and BRCA2 testing.  The Pap test is a screening test for  cervical cancer. Women should have a Pap test starting at age 40. Between ages 28 and 93, Pap tests should be repeated every 2 years. Beginning at age 61, you should have a Pap test every 3 years as long as the past 3 Pap tests have been normal. If you had a hysterectomy for a problem that was not cancer or a condition that could lead to cancer, then you no longer need Pap tests. If you are between ages 86 and 49, and you have had normal Pap tests going back 10 years, you no longer need Pap tests. If you have had past treatment for cervical cancer or a condition that could lead to cancer, you need Pap tests and screening for cancer for at least 20 years after your treatment. If Pap tests have been discontinued, risk factors (such as a new sexual partner) need to be reassessed to determine if screening should be resumed. Some women have medical problems that increase the chance of getting cervical cancer. In these cases, your caregiver may recommend more frequent screening and Pap tests.  The human papillomavirus (HPV) test is an  additional test that may be used for cervical cancer screening. The HPV test looks for the virus that can cause the cell changes on the cervix. The cells collected during the Pap test can be tested for HPV. The HPV test could be used to screen women aged 22 years and older, and should be used in women of any age who have unclear Pap test results. After the age of 102, women should have HPV testing at the same frequency as a Pap test.  Colorectal cancer can be detected and often prevented. Most routine colorectal cancer screening begins at the age of 70 and continues through age 96. However, your caregiver may recommend screening at an earlier age if you have risk factors for colon cancer. On a yearly basis, your caregiver may provide home test kits to check for hidden blood in the stool. Use of a small camera at the end of a tube, to directly examine the colon (sigmoidoscopy or  colonoscopy), can detect the earliest forms of colorectal cancer. Talk to your caregiver about this at age 69, when routine screening begins. Direct examination of the colon should be repeated every 5 to 10 years through age 23, unless early forms of pre-cancerous polyps or small growths are found.  Hepatitis C blood testing is recommended for all people born from [birth year] through 1967 and any individual with known risks for hepatitis C.  Practice safe sex. Use condoms and avoid high-risk sexual practices to reduce the spread of sexually transmitted infections (STIs). Sexually active women aged 26 and younger should be checked for Chlamydia, which is a common sexually transmitted infection. Older women with new or multiple partners should also be tested for Chlamydia. Testing for other STIs is recommended if you are sexually active and at increased risk.  Osteoporosis is a disease in which the bones lose minerals and strength with aging. This can result in serious bone fractures. The risk of osteoporosis can be identified using a bone density scan. Women ages 16 and over and women at risk for fractures or osteoporosis should discuss screening with their caregivers. Ask your caregiver whether you should be taking a calcium supplement or vitamin D to reduce the rate of osteoporosis.  Menopause can be associated with physical symptoms and risks. Hormone replacement therapy is available to decrease symptoms and risks. You should talk to your caregiver about whether hormone replacement therapy is right for you.  Use sunscreen. Apply sunscreen liberally and repeatedly throughout the day. You should seek shade when your shadow is shorter than you. Protect yourself by wearing long sleeves, pants, a wide-brimmed hat, and sunglasses year round, whenever you are outdoors.  Notify your caregiver of new moles or changes in moles, especially if there is a change in shape or color. Also notify your caregiver if a mole is  larger than the size of a pencil eraser.  Stay current with your immunizations. Document Released: 03/16/2011 Document Revised: 12/26/2012 Document Reviewed: 03/16/2011 [facility] Patient Information 2014 [place], [place].

## 2013-10-30 ENCOUNTER — Ambulatory Visit (INDEPENDENT_AMBULATORY_CARE_PROVIDER_SITE_OTHER): Payer: BC Managed Care – PPO | Admitting: Physician Assistant

## 2013-10-30 ENCOUNTER — Encounter: Payer: Self-pay | Admitting: Physician Assistant

## 2013-10-30 ENCOUNTER — Encounter: Payer: Self-pay | Admitting: *Deleted

## 2013-10-30 VITALS — BP 102/70 | HR 93 | Temp 97.9°F | Ht 66.0 in

## 2013-10-30 DIAGNOSIS — J209 Acute bronchitis, unspecified: Secondary | ICD-10-CM

## 2013-10-30 MED ORDER — AZITHROMYCIN 500 MG PO TABS: 500.0000 mg | ORAL_TABLET | Freq: Every day | ORAL | Status: DC

## 2013-10-30 MED ORDER — METHYLPREDNISOLONE 4 MG PO KIT: PACK | ORAL | Status: DC

## 2013-10-30 NOTE — Patient Instructions (Signed)
It was great to meet you today [name] [name]!   I have sent prescriptions to your pharmacy.     Laryngitis Laryngitis is redness, soreness, and puffiness (inflammation) of the vocal cords. It causes hoarseness, cough, loss of voice, sore throat, and dry throat. It may be caused by:  Infection.  Too much smoking.  Too much talking or yelling.  Breathing in of toxic fumes.  Allergies.  A backup of acid from your stomach. HOME CARE  Drink enough fluids to keep your pee (urine) clear or pale yellow.  Rest until you no longer have problems or as told by your doctor.  Breathe in moist air.  Take all medicine as told by your doctor.  Do not smoke.  Talk as little as possible (this includes whispering).  Write on paper instead of talking until your voice is back to normal.  Follow up with your doctor if you have not improved after 10 days. GET HELP IF:   You have trouble breathing.  You cough up blood.  You have a fever that will not go away.  You have increasing pain.  You have trouble swallowing. MAKE SURE YOU:  Understand these instructions.  Will watch your condition.  Will get help right away if you are not doing well or get worse. Document Released: 08/20/2011 Document Revised: 11/23/2011 Document Reviewed: 08/20/2011 [facility] Patient Information 2014 [place], [place].  Bronchitis Bronchitis is swelling (inflammation) of the air tubes leading to your lungs (bronchi). This causes mucus and a cough. If the swelling gets bad, you may have trouble breathing. HOME CARE   Rest.  Drink enough fluids to keep your pee (urine) clear or pale yellow (unless you have a condition where you have to watch how much you drink).  Only take medicine as told by your doctor. If you were given antibiotic medicines, finish them even if you start to feel better.  Avoid smoke, irritating chemicals, and strong smells. These make the problem worse. Quit smoking if you smoke. This  helps your lungs heal faster.  Use a cool mist humidifier. Change the water in the humidifier every day. You can also sit in the bathroom with hot shower running for 5 10 minutes. Keep the door closed.  See your health care provider as told.  Wash your hands often. GET HELP IF: Your problems do not get better after 1 week. GET HELP RIGHT AWAY IF:   Your fever gets worse.  You have chills.  Your chest hurts.  Your problems breathing get worse.  You have blood in your mucus.  You pass out (faint).  You feel lightheaded.  You have a bad headache.  You throw up (vomit) again and again. MAKE SURE YOU:  Understand these instructions.  Will watch your condition.  Will get help right away if you are not doing well or get worse. Document Released: 02/17/2008 Document Revised: 06/21/2013 Document Reviewed: 04/25/2013 [facility] Patient Information 2014 [place], [place].

## 2013-10-30 NOTE — Progress Notes (Deleted)
   Subjective:    Patient ID: [name] [name], female    DOB: 07-Jun-1951, 63 y.o.   MRN: [id number]  HPI Comments: Patient is a 63 year old female who presents to the clinic with complaint of cough, sore throat and laryngitis. Patient reports symptoms started yesterday with a nonproductive cough, throughout day throat scratchy and loosing her voice. Reports      Review of Systems     Objective:   Physical Exam        Assessment & Plan:

## 2013-10-30 NOTE — Progress Notes (Signed)
Pre-visit discussion using our clinic review tool. No additional management support is needed unless otherwise documented below in the visit note.  

## 2013-10-31 NOTE — Progress Notes (Signed)
   Patient ID: [name] [name] is a 63 y.o. female DOB: 03-Jun-1951 MRN: [id number]   Subjective:    HPI  complains of cough, starting one day PTA ,  associated with sore throat, mild headache and scratchy throat, Tmax -99 mild-mod chest congestion with non-productive cough No relief with OTC meds or inhalers. Hx significant for asthma, frequent bronchitis  Past Medical History  Diagnosis Date  . MIGRAINE HEADACHE   . Irritable bowel syndrome   . ASTHMA   . GERD   . BREAST CANCER, HX OF 11/1979    Review of Systems Constitutional: No fever or night sweats, no unexpected weight change Pulmonary: No pleurisy or hemoptysis Cardiovascular: No chest pain or palpitations, no LE edema Abdominal: No N/V/D Musculoskeletal: no myalgia     Objective:   Physical Exam  BP 102/70  Pulse 93  Temp(Src) 97.9 F (36.6 C) (Oral)  Ht 5\' 6"  (1.676 m)  SpO2 97%  GEN: mildly ill appearing and audible head/chest congestion HENT: NCAT, nares with clear discharge, oropharynx mild erythema, no exudate Eyes: Vision grossly intact, no conjunctivitis Lungs: mild expiratory wheeze noted, no labored breathing present, patient speaking in full sentences.  Cardiovascular: Regular rate and rhythm, no bilateral edema      Assessment & Plan:   Acute bronchitis  Empiric Zpak prescribed, continued use of inhalers as needed.  Medrol dose pack prescribed. Treat symptoms with Tylenol or Advil, hydration and rest -

## 2014-01-11 ENCOUNTER — Other Ambulatory Visit: Payer: Self-pay | Admitting: Internal Medicine

## 2014-01-11 NOTE — Telephone Encounter (Signed)
Done hardcopy to robin  

## 2014-01-11 NOTE — Telephone Encounter (Signed)
Faxed hardcopy to pharmacy. 

## 2014-02-15 ENCOUNTER — Other Ambulatory Visit: Payer: Self-pay | Admitting: Internal Medicine

## 2014-04-03 ENCOUNTER — Other Ambulatory Visit: Payer: Self-pay | Admitting: Internal Medicine

## 2014-05-01 ENCOUNTER — Other Ambulatory Visit: Payer: Self-pay

## 2014-05-01 MED ORDER — LEVALBUTEROL HCL 1.25 MG/3ML IN NEBU: 1.2500 mg | INHALATION_SOLUTION | RESPIRATORY_TRACT | Status: DC | PRN

## 2014-05-01 NOTE — Telephone Encounter (Signed)
Sent to MD for approval. Allergy Flag

## 2014-05-10 ENCOUNTER — Other Ambulatory Visit: Payer: Self-pay

## 2014-05-10 MED ORDER — LEVALBUTEROL TARTRATE 45 MCG/ACT IN AERO: 1.0000 | INHALATION_SPRAY | Freq: Four times a day (QID) | RESPIRATORY_TRACT | Status: DC | PRN

## 2014-05-15 ENCOUNTER — Telehealth: Payer: Self-pay

## 2014-05-15 NOTE — Telephone Encounter (Signed)
PA sent for Xopenex HFA.   Information sent back to plan via cover my meds.

## 2014-06-01 ENCOUNTER — Other Ambulatory Visit: Payer: Self-pay

## 2014-06-01 MED ORDER — EPINEPHRINE 0.3 MG/0.3ML IJ SOAJ: 0.3000 mg | Freq: Once | INTRAMUSCULAR | Status: DC

## 2014-08-08 ENCOUNTER — Encounter: Payer: BC Managed Care – PPO | Admitting: Internal Medicine

## 2014-08-28 ENCOUNTER — Telehealth: Payer: Self-pay | Admitting: Internal Medicine

## 2014-08-28 DIAGNOSIS — Z111 Encounter for screening for respiratory tuberculosis: Secondary | ICD-10-CM

## 2014-08-28 NOTE — Telephone Encounter (Signed)
Pt cannot due the TB skin test, she is allergic to it and needs a note to get out of doing at work. [phone]

## 2014-08-29 NOTE — Telephone Encounter (Signed)
To satisfy this scrrening, pt should have a blood test for  "Tb gold assay" in place of [employer] Ok to enter order if needed (pended - may sign if pt agrees) thanks

## 2014-08-30 NOTE — Telephone Encounter (Signed)
No letter without blood test - sorry

## 2014-08-30 NOTE — Telephone Encounter (Signed)
Pt given message, she says she will not do blood test.

## 2014-08-30 NOTE — Telephone Encounter (Signed)
LVM for pt to call back.

## 2014-08-30 NOTE — Telephone Encounter (Signed)
Pt stated that all she needed was a letter. She changed jobs and they are requiring the test. Pt states that she has not been exposed to TB.

## 2014-09-03 NOTE — Telephone Encounter (Signed)
Noted Thanks As stated, I can not sign for TB clearance without it

## 2014-10-22 ENCOUNTER — Ambulatory Visit: Payer: Medicare Other | Admitting: Internal Medicine

## 2014-10-22 ENCOUNTER — Encounter: Payer: Self-pay | Admitting: Internal Medicine

## 2014-10-22 ENCOUNTER — Other Ambulatory Visit (INDEPENDENT_AMBULATORY_CARE_PROVIDER_SITE_OTHER): Payer: Medicare Other

## 2014-10-22 VITALS — BP 120/80 | HR 69 | Temp 97.5°F | Ht 66.0 in | Wt 144.2 lb

## 2014-10-22 DIAGNOSIS — Z0000 Encounter for general adult medical examination without abnormal findings: Secondary | ICD-10-CM

## 2014-10-22 DIAGNOSIS — Z136 Encounter for screening for cardiovascular disorders: Secondary | ICD-10-CM

## 2014-10-22 DIAGNOSIS — R5383 Other fatigue: Secondary | ICD-10-CM

## 2014-10-22 DIAGNOSIS — Z1211 Encounter for screening for malignant neoplasm of colon: Secondary | ICD-10-CM | POA: Diagnosis not present

## 2014-10-22 DIAGNOSIS — Z79899 Other long term (current) drug therapy: Secondary | ICD-10-CM | POA: Diagnosis not present

## 2014-10-22 LAB — LIPID PANEL
Cholesterol: 219 mg/dL — ABNORMAL HIGH (ref 0–200)
HDL: 60 mg/dL (ref >39.00)
NonHDL: 159
Total CHOL/HDL Ratio: 4
Triglycerides: 227 mg/dL — ABNORMAL HIGH (ref 0.0–149.0)
VLDL: 45.4 mg/dL — AB (ref 0.0–40.0)

## 2014-10-22 LAB — BASIC METABOLIC PANEL
BUN: 17 mg/dL (ref 6–23)
CALCIUM: 9.9 mg/dL (ref 8.4–10.5)
CO2: 26 meq/L (ref 19–32)
Chloride: 106 mEq/L (ref 96–112)
Creatinine, Ser: 1.12 mg/dL (ref 0.40–1.20)
GFR: 51.74 mL/min — ABNORMAL LOW (ref >60.00)
GLUCOSE: 89 mg/dL (ref 70–99)
POTASSIUM: 4 meq/L (ref 3.5–5.1)
Sodium: 141 mEq/L (ref 135–145)

## 2014-10-22 LAB — CBC WITH DIFFERENTIAL/PLATELET
BASOS ABS: 0.1 10*3/uL (ref 0.0–0.1)
Basophils Relative: 0.9 % (ref 0.0–3.0)
EOS ABS: 0.2 10*3/uL (ref 0.0–0.7)
EOS PCT: 2.5 % (ref 0.0–5.0)
HCT: 38.5 % (ref 36.0–46.0)
HEMOGLOBIN: 12.9 g/dL (ref 12.0–15.0)
Lymphocytes Relative: 36.1 % (ref 12.0–46.0)
Lymphs Abs: 2.8 10*3/uL (ref 0.7–4.0)
MCHC: 33.5 g/dL (ref 30.0–36.0)
MCV: 85.4 fl (ref 78.0–100.0)
MONOS PCT: 6.6 % (ref 3.0–12.0)
Monocytes Absolute: 0.5 10*3/uL (ref 0.1–1.0)
NEUTROS ABS: 4.2 10*3/uL (ref 1.4–7.7)
Neutrophils Relative %: 53.9 % (ref 43.0–77.0)
Platelets: 497 10*3/uL — ABNORMAL HIGH (ref 150.0–400.0)
RBC: 4.51 Mil/uL (ref 3.87–5.11)
RDW: 14.3 % (ref 11.5–15.5)
WBC: 7.8 10*3/uL (ref 4.0–10.5)

## 2014-10-22 LAB — HEPATIC FUNCTION PANEL
ALT: 12 U/L (ref 0–35)
AST: 18 U/L (ref 0–37)
Albumin: 4.2 g/dL (ref 3.5–5.2)
Alkaline Phosphatase: 64 U/L (ref 39–117)
BILIRUBIN TOTAL: 0.3 mg/dL (ref 0.2–1.2)
Bilirubin, Direct: 0 mg/dL (ref 0.0–0.3)
Total Protein: 7 g/dL (ref 6.0–8.3)

## 2014-10-22 LAB — TSH: TSH: 4.68 u[IU]/mL — ABNORMAL HIGH (ref 0.35–4.50)

## 2014-10-22 LAB — LDL CHOLESTEROL, DIRECT: Direct LDL: 68 mg/dL

## 2014-10-22 MED ORDER — CYCLOBENZAPRINE HCL 10 MG PO TABS: 10.0000 mg | ORAL_TABLET | Freq: Three times a day (TID) | ORAL | Status: DC | PRN

## 2014-10-22 MED ORDER — ZOLPIDEM TARTRATE 10 MG PO TABS: 10.0000 mg | ORAL_TABLET | Freq: Every day | ORAL | Status: DC

## 2014-10-22 NOTE — Patient Instructions (Addendum)
It was good to see you today.  We have reviewed your prior records including labs and tests today  We'll have you screened for colon cancer with COLO[facility]UR[facility] (stool DNA testing) - this packet will be sent to your home by the testing company. Complete instructions as in the box, and we will contact you with the results once available.  Other Health Maintenance reviewed - all recommended immunizations and age-appropriate screenings are up-to-date.  Test(s) ordered today. Your results will be released to [place] (or called to you) after review, usually within 72hours after test completion. If any changes need to be made, you will be notified at that same time.  Medications reviewed and updated, no changes recommended at this time. Refill on medication(s) as discussed today.  Please schedule followup in 12 months for annual exam and labs, call sooner if problems.

## 2014-10-22 NOTE — Progress Notes (Signed)
Pre visit review using our clinic review tool, if applicable. No additional management support is needed unless otherwise documented below in the visit note. 

## 2014-10-22 NOTE — Progress Notes (Signed)
Subjective:    Patient ID: [name] [name], female    DOB: [birth year], 64 y.o.   MRN: [id number]  HPI   Here for medicare wellness  Diet: heart healthy  Physical activity: sedentary Depression/mood screen: negative Hearing: intact to whispered voice Visual acuity: grossly normal, performs annual eye exam  ADLs: capable Fall risk: none Home safety: good Cognitive evaluation: intact to orientation, naming, recall and repetition EOL planning: adv directives, full code/ I agree  I have personally reviewed and have noted 1. The patient's medical and social history 2. Their use of alcohol, tobacco or illicit drugs 3. Their current medications and supplements 4. The patient's functional ability including ADL's, fall risks, home safety risks and hearing or visual impairment. 5. Diet and physical activities 6. Evidence for depression or mood disorders  Also reviewed chronic medical issues  Past Medical History  Diagnosis Date  . MIGRAINE HEADACHE   . Irritable bowel syndrome   . ASTHMA   . GERD   . BREAST CANCER, HX OF 11/1979   Family History  Problem Relation Age of Onset  . Breast cancer Mother   . Uterine cancer Mother   . Coronary artery disease Mother   . Endometriosis Sister   . Colon polyps Sister 55    1/2 sister - same dad  . Coronary artery disease Father 29    CABG age 69  . Irritable bowel syndrome Sister     1/2 sister, C prone  . Hyperlipidemia Brother     1/2 bro, same dad  . Hypertension Brother     1/2 bro, same dad   History  Substance Use Topics  . Smoking status: Never Smoker   . Smokeless tobacco: Not on file  . Alcohol Use: 0.0 oz/week    0 Not specified per week    Review of Systems  Constitutional: Negative for fatigue and unexpected weight change.  Respiratory: Negative for cough, shortness of breath and wheezing.   Cardiovascular: Negative for chest pain, palpitations and leg swelling.  Gastrointestinal: Negative for nausea,  abdominal pain and diarrhea.  Neurological: Negative for dizziness, weakness, light-headedness and headaches.  Psychiatric/Behavioral: Negative for dysphoric mood. The patient is not nervous/anxious.   All other systems reviewed and are negative.      Objective:    Physical Exam  Constitutional: She is oriented to person, place, and time. She appears well-developed and well-nourished. No distress.  HENT:  Head: Normocephalic and atraumatic.  Right Ear: External ear normal.  Left Ear: External ear normal.  Nose: Nose normal.  Mouth/Throat: Oropharynx is clear and moist. No oropharyngeal exudate.  Eyes: EOM are normal. Pupils are equal, round, and reactive to light. Right eye exhibits no discharge. Left eye exhibits no discharge. No scleral icterus.  Neck: Normal range of motion. Neck supple. No JVD present. No tracheal deviation present. No thyromegaly present.  Cardiovascular: Normal rate, regular rhythm, normal heart sounds and intact distal pulses.  Exam reveals no friction rub.   No murmur heard. Pulmonary/Chest: Effort normal and breath sounds normal. No respiratory distress. She has no wheezes. She has no rales. She exhibits no tenderness.  Abdominal: Soft. Bowel sounds are normal. She exhibits no distension and no mass. There is no tenderness. There is no rebound and no guarding.  Genitourinary:  Defer to gyn  Musculoskeletal: Normal range of motion.  No gross deformities  Lymphadenopathy:    She has no cervical adenopathy.  Neurological: She is alert and oriented to person, place, and  time. She has normal reflexes. No cranial nerve deficit.  Skin: Skin is warm and dry. No rash noted. She is not diaphoretic. No erythema.  Psychiatric: She has a normal mood and affect. Her behavior is normal. Judgment and thought content normal.  Nursing note and vitals reviewed.   BP 120/80 mmHg  Pulse 69  Temp(Src) 97.5 F (36.4 C) (Oral)  Ht 5\' 6"  (1.676 m)  Wt 144 lb 4 oz (65.431 kg)   BMI 23.29 kg/m2  SpO2 97% Wt Readings from Last 3 Encounters:  10/22/14 144 lb 4 oz (65.431 kg)  08/23/13 139 lb 1.9 oz (63.104 kg)  07/14/13 140 lb 3.2 oz (63.594 kg)    Lab Results  Component Value Date   WBC 7.8 10/22/2014   HGB 12.9 10/22/2014   HCT 38.5 10/22/2014   PLT 497.0* 10/22/2014   GLUCOSE 89 10/22/2014   CHOL 219* 10/22/2014   TRIG 227.0* 10/22/2014   HDL 60.00 10/22/2014   LDLDIRECT 68.0 10/22/2014   ALT 12 10/22/2014   AST 18 10/22/2014   NA 141 10/22/2014   K 4.0 10/22/2014   CL 106 10/22/2014   CREATININE 1.12 10/22/2014   BUN 17 10/22/2014   CO2 26 10/22/2014   TSH 4.68* 10/22/2014    Mm Digital Screening W/ Implants  02/02/2013   *RADIOLOGY REPORT*  Clinical Data: Screening.  DIGITAL SCREENING MAMMOGRAM WITH IMPLANTS AND CAD  The patient has  implants. Standard and implant displaced views were performed.  Comparison: Previous exams.  FINDINGS:  ACR Breast Density Category 1: The breast tissue is almost entirely fatty.  No suspicious masses, non-surgical architectural distortion, or suspicious calcifications are identified.  Images were processed with CAD.  IMPRESSION: No mammographic evidence of malignancy.  A result letter of this screening mammogram will be mailed directly to the patient.  RECOMMENDATION: Screening mammogram in one year. (Code:SM-B-01Y)  BI-RADS CATEGORY 1:  Negative.   Original Report Authenticated By: Skipper Cliche, M.D.       Assessment & Plan:   AWV/CPX/z00.00 - Today patient counseled on age appropriate routine health concerns for screening and prevention, each reviewed and up to date or declined. Immunizations reviewed and up to date or declined. Labs ordered and reviewed. Risk factors for depression reviewed and negative. Hearing function and visual acuity are intact. ADLs screened and addressed as needed. Functional ability and level of safety reviewed and appropriate. Education, counseling and referrals performed based on  assessed risks today. Patient provided with a copy of personalized plan for preventive services.  Patient will consider cologuard DNA screening for colon ca test rather than colonoscopy which she refuses  Fatigue - nonspecific symptoms/exam - check screening labs   Problem List Items Addressed This Visit    None    Visit Diagnoses    Routine general medical examination at a health care facility    -  Primary    Relevant Orders    Basic metabolic panel (Completed)    CBC with Differential/Platelet (Completed)    Hepatic function panel (Completed)    Lipid panel (Completed)    TSH (Completed)    Other fatigue        Relevant Medications    cyclobenzaprine (FLEXERIL) tablet    Other Relevant Orders    Basic metabolic panel (Completed)    CBC with Differential/Platelet (Completed)    Hepatic function panel (Completed)    TSH (Completed)    Screening for cardiovascular condition        Relevant Orders  Lipid panel (Completed)    Special screening for malignant neoplasms, colon            Gwendolyn Grant, MD

## 2014-11-21 ENCOUNTER — Other Ambulatory Visit: Payer: Self-pay | Admitting: Internal Medicine

## 2014-11-23 NOTE — Telephone Encounter (Signed)
MD out of office pls advise on refill.../lmb 

## 2014-12-27 ENCOUNTER — Encounter (HOSPITAL_COMMUNITY): Payer: Self-pay

## 2014-12-27 ENCOUNTER — Emergency Department (HOSPITAL_COMMUNITY)
Admission: EM | Admit: 2014-12-27 | Discharge: 2014-12-28 | Disposition: A | Discharge status: Home / Self Care | Payer: Medicare Other | Attending: Emergency Medicine | Admitting: Emergency Medicine

## 2014-12-27 DIAGNOSIS — Y939 Activity, unspecified: Secondary | ICD-10-CM | POA: Diagnosis not present

## 2014-12-27 DIAGNOSIS — Y999 Unspecified external cause status: Secondary | ICD-10-CM | POA: Insufficient documentation

## 2014-12-27 DIAGNOSIS — R0602 Shortness of breath: Secondary | ICD-10-CM | POA: Diagnosis not present

## 2014-12-27 DIAGNOSIS — X58XXXA Exposure to other specified factors, initial encounter: Secondary | ICD-10-CM | POA: Insufficient documentation

## 2014-12-27 DIAGNOSIS — Y929 Unspecified place or not applicable: Secondary | ICD-10-CM | POA: Insufficient documentation

## 2014-12-27 DIAGNOSIS — G43909 Migraine, unspecified, not intractable, without status migrainosus: Secondary | ICD-10-CM | POA: Insufficient documentation

## 2014-12-27 DIAGNOSIS — Z79899 Other long term (current) drug therapy: Secondary | ICD-10-CM | POA: Insufficient documentation

## 2014-12-27 DIAGNOSIS — Z853 Personal history of malignant neoplasm of breast: Secondary | ICD-10-CM | POA: Insufficient documentation

## 2014-12-27 DIAGNOSIS — T886XXA Anaphylactic reaction due to adverse effect of correct drug or medicament properly administered, initial encounter: Secondary | ICD-10-CM | POA: Diagnosis not present

## 2014-12-27 DIAGNOSIS — Z8719 Personal history of other diseases of the digestive system: Secondary | ICD-10-CM | POA: Diagnosis not present

## 2014-12-27 DIAGNOSIS — Z880 Allergy status to penicillin: Secondary | ICD-10-CM | POA: Diagnosis not present

## 2014-12-27 DIAGNOSIS — J45901 Unspecified asthma with (acute) exacerbation: Secondary | ICD-10-CM | POA: Diagnosis not present

## 2014-12-27 DIAGNOSIS — Z7951 Long term (current) use of inhaled steroids: Secondary | ICD-10-CM | POA: Diagnosis not present

## 2014-12-27 DIAGNOSIS — T782XXA Anaphylactic shock, unspecified, initial encounter: Secondary | ICD-10-CM | POA: Diagnosis not present

## 2014-12-27 MED ORDER — LEVALBUTEROL HCL 1.25 MG/0.5ML IN NEBU
1.2500 mg | INHALATION_SOLUTION | RESPIRATORY_TRACT | Status: DC
  Administered 2014-12-27 (×4): 1.25 mg via RESPIRATORY_TRACT
  Filled 2014-12-27: qty 0.5

## 2014-12-27 MED ORDER — ONDANSETRON HCL 4 MG/2ML IJ SOLN
INTRAMUSCULAR | Status: AC
  Administered 2014-12-27: 4 mg
  Filled 2014-12-27: qty 2

## 2014-12-27 MED ORDER — DIPHENHYDRAMINE HCL 50 MG/ML IJ SOLN
50.0000 mg | Freq: Once | INTRAMUSCULAR | Status: AC
  Administered 2014-12-27: 50 mg via INTRAVENOUS

## 2014-12-27 MED ORDER — LEVALBUTEROL HCL 1.25 MG/3ML IN NEBU
1.2500 mg | INHALATION_SOLUTION | RESPIRATORY_TRACT | Status: DC | PRN
  Filled 2014-12-27: qty 3

## 2014-12-27 MED ORDER — METHYLPREDNISOLONE SODIUM SUCC 125 MG IJ SOLR
125.0000 mg | Freq: Once | INTRAMUSCULAR | Status: AC
  Administered 2014-12-27: 125 mg via INTRAVENOUS

## 2014-12-27 NOTE — ED Provider Notes (Signed)
CSN: [id number]     Arrival date & time 12/27/14  2004 History   First MD Initiated Contact with Patient 12/27/14 2026     Chief Complaint  Patient presents with  . Shortness of Breath     (Consider location/radiation/quality/duration/timing/severity/associated sxs/prior Treatment) HPI Comments: The patient arrives to the hospital with severe symptoms not allowing her to speak. 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 When she was near a patient who is taking an albuterol nebulizer treatment she was exposed to the albuterol which does contain sulfa and had acute onset of shortness of breath, wheezing, tightness and severe difficulty breathing. She was given an EpiPen which had no improvement of the patient's symptoms, in fact the EpiPen contained a sulfur containing component. She was brought to the hospital immediately in severe distress.  Patient is a 64 y.o. female presenting with shortness of breath. The history is provided by the patient and the spouse.  Shortness of Breath   Past Medical History  Diagnosis Date  . MIGRAINE HEADACHE   . Irritable bowel syndrome   . ASTHMA   . GERD   . BREAST CANCER, HX OF 11/1979   Past Surgical History  Procedure Laterality Date  . Mastectomy  11/1979    Bilaterally & implants  . Abdominal hysterectomy  11/1979  . Tubal ligation  1975  . Tonsillectomy  1955  . Left wrist  1963   Family History  Problem Relation Age of Onset  . Breast cancer Mother   . Uterine cancer Mother   . Coronary artery disease Mother   . Endometriosis Sister   . Colon polyps Sister 66    1/2 sister - same dad  . Coronary artery disease Father 87    CABG age 53  . Irritable bowel syndrome Sister     1/2 sister, C prone  . Hyperlipidemia Brother     1/2 bro, same dad  . Hypertension Brother     1/2 bro, same dad   History  Substance Use Topics  . Smoking  status: Never Smoker   . Smokeless tobacco: Not on file  . Alcohol Use: 0.0 oz/week    0 Standard drinks or equivalent per week   OB History    No data available     Review of Systems  Unable to perform ROS: Severe respiratory distress  Respiratory: Positive for shortness of breath.       Allergies  Albuterol sulfate; Amoxicillin-pot clavulanate; Fluzone; Influenza vaccines; Morphine; Povidone-iodine; Sulfonamide derivatives; Ciprofloxacin; Hyoscyamine sulfate; Meperidine hcl; Other; Pneumococcal vaccines; and Zostavax  Home Medications   Prior to Admission medications   Medication Sig Start Date End Date Taking? Authorizing Provider  aspirin-acetaminophen-caffeine (EXCEDRIN MIGRAINE) [phone] MG per tablet Take 1 tablet by mouth every 6 (six) hours as needed. For migraine   Yes Historical Provider, MD  Calcium Carbonate-Vit D-Min 600-200 MG-UNIT TABS Take 1 tablet by mouth 2 (two) times daily.   Yes Historical Provider, MD  cyclobenzaprine (FLEXERIL) 10 MG tablet Take 1 tablet (10 mg total) by mouth 3 (three) times daily as needed for muscle spasms. 10/22/14  Yes Rowe Clack, MD  diphenhydrAMINE (BENADRYL) 25 MG tablet Take 25 mg by mouth every 8 (eight) hours as needed. For allergies   Yes Historical Provider, MD  EPINEPHrine (EPIPEN) 0.3 mg/0.3 mL DEVI Inject 0.3 mLs (0.3 mg total) into the muscle once as  needed. 11/04/11  Yes Rowe Clack, MD  levalbuterol [facility] HFA) 45 MCG/ACT inhaler Inhale 1-2 puffs into the lungs 4 (four) times daily as needed. For shortness of breath 05/10/14  Yes Rowe Clack, MD  levalbuterol (XOPENEX) 1.25 MG/3ML nebulizer solution Take 1.25 mg by nebulization every 4 (four) hours as needed for wheezing. 05/01/14  Yes Rowe Clack, MD  Melatonin 10 MG CAPS Take 10 mg by mouth at bedtime.   Yes Historical Provider, MD  mometasone-formoterol (DULERA) 200-5 MCG/ACT AERO Inhale 2 puffs into the lungs 2 (two) times daily. Rinse mouth  08/23/13  Yes Rowe Clack, MD  Multiple Vitamin (MULTIVITAMIN) tablet Take 1 tablet by mouth daily.     Yes Historical Provider, MD  promethazine (PHENERGAN) 25 MG tablet Take 1 tablet (25 mg total) by mouth every 6 (six) hours as needed for nausea. 03/09/13  Yes Rowe Clack, MD  vitamin E 400 UNIT capsule Take 400 Units by mouth daily.     Yes Historical Provider, MD  zolpidem (AMBIEN) 5 MG tablet Take 1 tablet (5 mg total) by mouth at bedtime. 11/23/14  Yes Olga Millers, MD  azithromycin (ZITHROMAX) 500 MG tablet Take 1 tablet (500 mg total) by mouth daily. Patient not taking: Reported on 12/27/2014 10/30/13   Kyra Leyland, PA-C  diphenhydrAMINE (BENADRYL) 25 MG tablet Take 1 tablet (25 mg total) by mouth every 6 (six) hours as needed for itching (Rash). 12/28/14   Noemi Chapel, MD  EPINEPHrine (EPIPEN 2-PAK) 0.3 mg/0.3 mL IJ SOAJ injection Inject 0.3 mLs (0.3 mg total) into the muscle once. Patient not taking: Reported on 12/27/2014 06/01/14   Rowe Clack, MD  ibuprofen (ADVIL,MOTRIN) 800 MG tablet TAKE ONE TABLET BY MOUTH EVERY 8 HOURS AS NEEDED FOR PAIN Patient not taking: Reported on 12/27/2014 04/03/14   Rowe Clack, MD  methylPREDNISolone (MEDROL DOSEPAK) 4 MG tablet follow package directions Patient not taking: Reported on 12/27/2014 10/30/13   Kyra Leyland, PA-C  predniSONE (DELTASONE) 20 MG tablet Take 2 tablets (40 mg total) by mouth daily. 12/28/14   Noemi Chapel, MD  zolpidem (AMBIEN) 10 MG tablet Take 1 tablet (10 mg total) by mouth at bedtime. Patient not taking: Reported on 12/27/2014 11/22/14   Rowe Clack, MD   BP 103/62 mmHg  Pulse 108  Temp(Src) 97.9 F (36.6 C) (Axillary)  Resp 20  SpO2 97% Physical Exam  Constitutional: She appears well-developed and well-nourished. She appears distressed.  HENT:  Head: Normocephalic and atraumatic.  Mouth/Throat: Oropharynx is clear and moist. No oropharyngeal exudate.  Eyes: Conjunctivae and EOM  are normal. Pupils are equal, round, and reactive to light. Right eye exhibits no discharge. Left eye exhibits no discharge. No scleral icterus.  Neck: Normal range of motion. Neck supple. No JVD present. No thyromegaly present.  Cardiovascular: Regular rhythm, normal heart sounds and intact distal pulses.  Exam reveals no gallop and no friction rub.   No murmur heard. Tachycardic to 145 bpm  Pulmonary/Chest: She is in respiratory distress. She has wheezes. She has no rales.  Diffuse wheezing in all lung fields, decreased air movement, unable to speak, increased work of breathing, accessory muscle use  Abdominal: Soft. Bowel sounds are normal. She exhibits no distension and no mass. There is no tenderness.  Musculoskeletal: Normal range of motion. She exhibits no edema or tenderness.  Lymphadenopathy:    She has no cervical adenopathy.  Neurological: She is alert. Coordination normal.  Skin: Skin  is warm and dry. No rash noted. No erythema.  Psychiatric: She has a normal mood and affect. Her behavior is normal.  Nursing note and vitals reviewed.   ED Course  Procedures (including critical care time) Labs Review Labs Reviewed  URINE RAPID DRUG SCREEN (HOSP PERFORMED)    Imaging Review No results found.  ED ECG REPORT  I personally interpreted this EKG   Date: 12/28/2014   Rate: 133  Rhythm: normal sinus rhythm  QRS Axis: left  Intervals: normal  ST/T Wave abnormalities: normal  Conduction Disutrbances:none  Narrative Interpretation:   Old EKG Reviewed: none available   MDM   Final diagnoses:  Anaphylactic reaction, initial encounter    The patient is in severe respiratory distress, she is receiving xopenex, solu-medrol and benadryl which are all safe, unfortunately she cannot take, albuterol or epinephrine due to their sulfur containing components. The patient will need to be admitted to the hospital due to her anaphylactic shock. Thankfully she is not hypotensive but is  coughing severely, persistently nauseated, lightheaded and persistently tachycardic.  The patient has continued to have significant improvement, her pulse is now around 95, her blood pressure is normal, she has no respiratory distress, is speaking in full sentences and adamantly refuses to be admitted to the hospital. She states that she wants to go home, she is adamant that she knows the indications for return and can take care of herself at home. I have recommended strongly that she be admitted for observation but she refuses. She will be discharged with prednisone and Benadryl, she has been able to express her understanding of the indications for return and the potential complications of being discharged home is that of being admitted including permanent disability and death related to anaphylactic reaction.  Meds given in ED:  Medications  levalbuterol (XOPENEX) nebulizer solution 1.25 mg (not administered)  levalbuterol (XOPENEX) nebulizer solution 1.25 mg (1.25 mg Nebulization New Bag/Given 12/27/14 2031)  ondansetron (ZOFRAN) 4 MG/2ML injection (4 mg  Given 12/27/14 2015)  methylPREDNISolone sodium succinate (SOLU-MEDROL) 125 mg/2 mL injection 125 mg (125 mg Intravenous Given 12/27/14 2024)  diphenhydrAMINE (BENADRYL) injection 50 mg (50 mg Intravenous Given 12/27/14 2023)    New Prescriptions   DIPHENHYDRAMINE (BENADRYL) 25 MG TABLET    Take 1 tablet (25 mg total) by mouth every 6 (six) hours as needed for itching (Rash).   PREDNISONE (DELTASONE) 20 MG TABLET    Take 2 tablets (40 mg total) by mouth daily.      Noemi Chapel, MD 12/28/14 [phone]

## 2014-12-27 NOTE — ED Notes (Signed)
Pt presented to ED from upstairs with SOB and difficulty breathing

## 2014-12-28 MED ORDER — PREDNISONE 20 MG PO TABS: 40.0000 mg | ORAL_TABLET | Freq: Every day | ORAL | Status: DC

## 2014-12-28 MED ORDER — DIPHENHYDRAMINE HCL 25 MG PO TABS: 25.0000 mg | ORAL_TABLET | Freq: Four times a day (QID) | ORAL | Status: DC | PRN

## 2014-12-28 NOTE — Discharge Instructions (Signed)
Please call your doctor for a followup appointment within 24-48 hours. When you talk to your doctor please let them know that you were seen in the emergency department and have them acquire all of your records so that they can discuss the findings with you and formulate a treatment plan to fully care for your new and ongoing problems.   Anaphylactic Reaction An anaphylactic reaction is a sudden, severe allergic reaction. It affects the whole body. It can be life threatening. You may need to stay in the hospital.  [place]ott a medical bracelet or necklace that lists your allergy.  Carry your allergy kit or medicine shot to treat severe allergic reactions with you. These can save your life.  Do not drive until medicine from your shot has worn off, unless your doctor says it is okay.  If you have hives or a rash:  Take medicine as told by your doctor.  You may take over-the-counter antihistamine medicine.  Place cold cloths on your skin. Take baths in cool water. Avoid hot baths and hot showers. GET HELP RIGHT AWAY IF:   Your mouth is puffy (swollen), or you have trouble breathing.  You start making whistling sounds when you breathe (wheezing).  You have a tight feeling in your chest or throat.  You have a rash, hives, puffiness, or itching on your body.  You throw up (vomit) or have watery poop (diarrhea).  You feel dizzy or pass out (faint).  You think you are having an allergic reaction.  You have new symptoms. This is an emergency. Use your medicine shot or allergy kit as told. Call your local emergency services (911 in [place] Even if you feel better after the shot, you need to go to the hospital emergency department. MAKE SURE YOU:   Understand these instructions.  Will watch your condition.  Will get help right away if you are not doing well or get worse. Document Released: 02/17/2008 Document Revised: 03/01/2012 Document Reviewed: 12/02/2011 [facility] Patient  Information 2015 [place], [place]. This information is not intended to replace advice given to you by your health care provider. Make sure you discuss any questions you have with your health care provider.

## 2014-12-28 NOTE — ED Notes (Signed)
Pt stable, ambulatory, denies any pain, states understanding of discharge instructions 

## 2015-01-09 ENCOUNTER — Ambulatory Visit: Payer: Medicare Other | Admitting: Internal Medicine

## 2015-01-09 ENCOUNTER — Encounter: Payer: Self-pay | Admitting: Internal Medicine

## 2015-01-09 VITALS — BP 114/62 | HR 81 | Ht 64.0 in | Wt 141.0 lb

## 2015-01-09 DIAGNOSIS — J45909 Unspecified asthma, uncomplicated: Secondary | ICD-10-CM

## 2015-01-09 MED ORDER — LEVALBUTEROL TARTRATE 45 MCG/ACT IN AERO: 1.0000 | INHALATION_SPRAY | Freq: Four times a day (QID) | RESPIRATORY_TRACT | Status: DC | PRN

## 2015-01-09 NOTE — Patient Instructions (Signed)
Script for xopenex rescue inhaler printed  Please call as needed

## 2015-01-09 NOTE — Progress Notes (Signed)
Patient ID: [name] [name], female    DOB: [birth year], 64 y.o.   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 She comes now to re-establish after ER visit. PCP Dr Asa Lente. Husband is here.  She was feeling well 2 days ago. As she walked from outdoor heat into [employer] building as she often does, she experienced acute chest tightness, cough. She had just spent 2 hours in a patient's house which had recently been sprayed for insects. She was treated at [facility] ER and discharged taking prednisone 20 mg daily x 3 days. Still feels tight, not fully broken. Denies nasal symptoms with this episode. No chest pain, fever, sputum. Some dry cough. Does not usually feel reflux often. Has had 2-3 episodes of bronchitis this year, treated prednisone. Used Epipen in March after angioedema from ?shrimp. Medical hx includes GERD, IBS, hx breast cancer. Reports med allergies include albuterol, augmentin, morphine, sulfa, Singulair.  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 She comes now to re-establish after ER visit. PCP Dr Asa Lente. Husband is here.  2 episodes of bronchitis since Christmas. Some increased shortness of breath today but nothing really acute. Strong odors or trigger. She is really focused on avoiding any medication that might have a sulfite and I tried to explain the difference between sulfa antibiotics, sulfite preservatives and molecular sulfur. She expects these to cause shortness of breath, swelling and wheeze. She is taking Dulera 100 twice daily and Xopenex HFA about once daily.  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 She comes now to re-establish after ER visit. PCP Dr Asa Lente. Husband here FOLLOWS FOR: was seen at [facility] back in November 2013 for asthma flare up. Now his nebulizer  machine with Xopenex. Uses Dulera intermittently which we discussed. Triggers include respiratory irritants including cigarette smoke when she makes home visits for her job. No known exposure to TB but TB check required for her job. She has a history of an immediate intense local reaction to PPD skin test which she blames on sulfa allergy Asthma control now good.  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  ACUTE VISIT: reaction to albuterol while at work on 12-27-14-went to [facility] hospital)   ROS-see HPI Constitutional:   No-   weight loss, night sweats, fevers, chills, fatigue, lassitude. HEENT:   No-  headaches, difficulty swallowing, tooth/dental problems, sore throat,       No-  sneezing, itching, ear ache, nasal congestion, post nasal drip,  CV:  No-   chest pain, orthopnea, PND, swelling in lower extremities, anasarca,  dizziness, palpitations Resp: No-   shortness of breath with exertion or at rest.              No-   productive cough,  No non-productive cough,  No- coughing up of blood.              No-   change in color of mucus.  No- wheezing.   Skin: No-   rash or lesions. GI:  No-   heartburn, indigestion, abdominal pain, nausea, vomiting,  GU:  MS:  No-   joint pain or swelling. Neuro-     nothing unusual Psych:  No- change in mood or affect. No depression or anxiety.  No memory loss.   Objective:   Physical Exam  General- Alert, Oriented, Affect-appropriate, Distress- none acute Skin- rash-none, lesions- none, excoriation- none Lymphadenopathy- none Head- atraumatic            Eyes- Gross vision intact, PERRLA, conjunctivae clear secretions            Ears- Hearing, canals            Nose- Clear, No-Septal dev, mucus, polyps, erosion, perforation             Throat- Mallampati II , mucosa red , drainage- none, tonsils- atrophic ,active gag. Flinched at  neck exam. Neck- flexible , trachea midline, no stridor ,  thyroid nl, carotid no bruit Chest - symmetrical excursion , unlabored           Heart/CV- RRR , no murmur , no gallop  , no rub, nl s1 s2                           - JVD- none , edema- none, stasis changes- none, varices- none           Lung- clear to P&A,wheeze- none, cough- none , dullness-none, rub- none           Chest wall-  Abd-  Br/ Gen/ Rectal- Not done, not indicated Extrem- cyanosis- none, clubbing, none, atrophy- none, strength- nl Neuro- grossly intact to observation

## 2015-01-15 ENCOUNTER — Telehealth: Payer: Self-pay | Admitting: Internal Medicine

## 2015-01-15 NOTE — Telephone Encounter (Signed)
Paperwork located. Awaiting Dr. Katheren Puller signature. Will call after 4:30

## 2015-01-15 NOTE — Telephone Encounter (Signed)
Patient calling regarding some workers comp papers. Please call after 4:30

## 2015-01-22 NOTE — Telephone Encounter (Signed)
LVM for pt to call back.   RE: workers accommodation request form has been signed and is ready for pick up. Left up front for pt.  Copy has been made so charge sheet can be attached.

## 2015-02-05 ENCOUNTER — Telehealth: Payer: Self-pay

## 2015-02-05 NOTE — Telephone Encounter (Signed)
Pt printed email from employer requesting additional information about allergy to allbuterol.   PCP indicated on print out the different parameters and other requested information.   Pt called and informed that print out was ready for pick up.

## 2015-06-06 ENCOUNTER — Other Ambulatory Visit: Payer: Self-pay | Admitting: Internal Medicine

## 2015-06-06 NOTE — Telephone Encounter (Signed)
OK x1 Note: last seen 2/16

## 2015-08-20 ENCOUNTER — Encounter: Payer: Medicare Other | Admitting: Internal Medicine

## 2015-08-22 ENCOUNTER — Ambulatory Visit (INDEPENDENT_AMBULATORY_CARE_PROVIDER_SITE_OTHER): Payer: Medicare Other | Admitting: Internal Medicine

## 2015-08-22 ENCOUNTER — Encounter: Payer: Self-pay | Admitting: Internal Medicine

## 2015-08-22 VITALS — BP 118/80 | HR 92 | Temp 98.2°F | Ht 64.0 in | Wt 142.5 lb

## 2015-08-22 DIAGNOSIS — E781 Pure hyperglyceridemia: Secondary | ICD-10-CM | POA: Diagnosis not present

## 2015-08-22 DIAGNOSIS — D473 Essential (hemorrhagic) thrombocythemia: Secondary | ICD-10-CM

## 2015-08-22 DIAGNOSIS — J4520 Mild intermittent asthma, uncomplicated: Secondary | ICD-10-CM

## 2015-08-22 DIAGNOSIS — R739 Hyperglycemia, unspecified: Secondary | ICD-10-CM | POA: Diagnosis not present

## 2015-08-22 DIAGNOSIS — D75839 Thrombocytosis, unspecified: Secondary | ICD-10-CM

## 2015-08-22 NOTE — Patient Instructions (Addendum)
Please continue all other medications as before, and refills have been done if requested.  Please have the pharmacy call with any other refills you may need.  Please continue your efforts at being more active, low cholesterol diet, and weight control.  You are otherwise up to date with prevention measures today.  Please keep your appointments with your specialists as you may have planned  Please return in 3 months, or sooner if needed, with Lab testing to be done that day

## 2015-08-22 NOTE — Progress Notes (Signed)
Subjective:    Patient ID: [name] [name], female    DOB: 06/24/1951, 64 y.o.   MRN: [id number]  HPI  Here for yearly f/u;  Overall doing ok;  Pt denies Chest pain, worsening SOB, DOE, wheezing, orthopnea, PND, worsening LE edema, palpitations, dizziness or syncope.  Pt denies neurological change such as new headache, facial or extremity weakness.  Pt denies polydipsia, polyuria, or low sugar symptoms. Pt states overall good compliance with treatment and medications, good tolerability, and has been trying to follow appropriate diet.  Pt denies worsening depressive symptoms, suicidal ideation or panic. No fever, night sweats, wt loss, loss of appetite, or other constitutional symptoms.  Pt states good ability with ADL's, has low fall risk, home safety reviewed and adequate, no other significant changes in hearing or vision, and only occasionally active with exercise.  Retired Therapist, sports, has numerous documented med allergies, has epipen in purse at all times. No new complaints Past Medical History  Diagnosis Date  . MIGRAINE HEADACHE   . Irritable bowel syndrome   . ASTHMA   . GERD   . BREAST CANCER, HX OF 11/1979   Past Surgical History  Procedure Laterality Date  . Mastectomy  11/1979    Bilaterally & implants  . Abdominal hysterectomy  11/1979  . Tubal ligation  1975  . Tonsillectomy  1955  . Left wrist  1963    reports that she has never smoked. She does not have any smokeless tobacco history on file. She reports that she drinks alcohol. She reports that she does not use illicit drugs. family history includes Breast cancer in her mother; Colon polyps (age of onset: 43) in her sister; Coronary artery disease in her mother; Coronary artery disease (age of onset: 8) in her father; Endometriosis in her sister; Hyperlipidemia in her brother; Hypertension in her brother; Irritable bowel syndrome in her sister; Uterine cancer in her mother. Allergies  Allergen Reactions  . Albuterol Sulfate Anaphylaxis     Pt states she has tolerated levalbuterol  . Amoxicillin-Pot Clavulanate     REACTION: ANAPHYLACTIC REACTION  Lips swell   . Fluzone     REACTION: ANAPHYLACTIC  . Influenza Vaccines Anaphylaxis  . Morphine     REACTION: ANAPHYLACTIC REACTION  . Povidone-Iodine     REACTION: HIVES/ANAPHYLACTIC WITH INJECTABLE DYE  . Sulfonamide Derivatives     REACTION: ANAPHYLACTIC REACTION  . Atrovent [Ipratropium] Other (See Comments)    Has sulfa in preservative  . Ciprofloxacin     Itchy palms  . Hyoscyamine Sulfate Hives  . Meperidine Hcl     REACTION: N\T\V  . Other     Mucomist-unable to breathe  . Pepcid [Famotidine] Other (See Comments)  . Pneumococcal Vaccines     Pt declines  . Ppd [Tuberculin Purified Protein Derivative]     Arm swelling with red streaks  . Reglan [Metoclopramide] Other (See Comments)  . Zostavax [Zoster Vaccine Live]     Pt declines   Current Outpatient Prescriptions on File Prior to Visit  Medication Sig Dispense Refill  . aspirin-acetaminophen-caffeine (EXCEDRIN MIGRAINE) 250-250-65 MG per tablet Take 1 tablet by mouth every 6 (six) hours as needed. For migraine    . Calcium Carbonate-Vit D-Min 600-200 MG-UNIT TABS Take 1 tablet by mouth 2 (two) times daily.    . cyclobenzaprine (FLEXERIL) 10 MG tablet Take 1 tablet (10 mg total) by mouth 3 (three) times daily as needed for muscle spasms. 60 tablet 2  . diphenhydrAMINE (BENADRYL) 25 MG  tablet Take 25 mg by mouth every 8 (eight) hours as needed. For allergies    . EPINEPHrine (EPIPEN 2-PAK) 0.3 mg/0.3 mL IJ SOAJ injection Inject 0.3 mLs (0.3 mg total) into the muscle once. 2 Device 5  . levalbuterol (XOPENEX HFA) 45 MCG/ACT inhaler Inhale 1-2 puffs into the lungs every 6 (six) hours as needed. For shortness of breath 1 Inhaler prn  . levalbuterol (XOPENEX) 1.25 MG/3ML nebulizer solution Take 1.25 mg by nebulization every 4 (four) hours as needed for wheezing. 72 mL 12  . Melatonin 10 MG CAPS Take 10 mg by  mouth at bedtime.    . mometasone-formoterol (DULERA) 200-5 MCG/ACT AERO Inhale 2 puffs into the lungs 2 (two) times daily. Rinse mouth 1 Inhaler 2  . Multiple Vitamin (MULTIVITAMIN) tablet Take 1 tablet by mouth daily.      . promethazine (PHENERGAN) 25 MG tablet Take 1 tablet (25 mg total) by mouth every 6 (six) hours as needed for nausea. 30 tablet 0  . vitamin E 400 UNIT capsule Take 400 Units by mouth daily.      Marland Kitchen zolpidem (AMBIEN) 10 MG tablet Take 1 tablet (10 mg total) by mouth at bedtime. (Patient taking differently: Take 10 mg by mouth at bedtime as needed. ) 30 tablet 1   No current facility-administered medications on file prior to visit.     Review of Systems Constitutional: Negative for increased diaphoresis, other activity, appetite or siginficant weight change other than noted HENT: Negative for worsening hearing loss, ear pain, facial swelling, mouth sores and neck stiffness.   Eyes: Negative for other worsening pain, redness or visual disturbance.  Respiratory: Negative for shortness of breath and wheezing  Cardiovascular: Negative for chest pain and palpitations.  Gastrointestinal: Negative for diarrhea, blood in stool, abdominal distention or other pain Genitourinary: Negative for hematuria, flank pain or change in urine volume.  Musculoskeletal: Negative for myalgias or other joint complaints.  Skin: Negative for color change and wound or drainage.  Neurological: Negative for syncope and numbness. other than noted Hematological: Negative for adenopathy. or other swelling Psychiatric/Behavioral: Negative for hallucinations, SI, self-injury, decreased concentration or other worsening agitation.      Objective:   Physical Exam BP 118/80 mmHg  Pulse 92  Temp(Src) 98.2 F (36.8 C) (Oral)  Ht 5\' 4"  (1.626 m)  Wt 142 lb 8 oz (64.638 kg)  BMI 24.45 kg/m2  SpO2 95% VS noted,  Constitutional: Pt is oriented to person, place, and time. Appears well-developed and  well-nourished, in no significant distress Head: Normocephalic and atraumatic.  Right Ear: External ear normal.  Left Ear: External ear normal.  Nose: Nose normal.  Mouth/Throat: Oropharynx is clear and moist.  Eyes: Conjunctivae and EOM are normal. Pupils are equal, round, and reactive to light.  Neck: Normal range of motion. Neck supple. No JVD present. No tracheal deviation present or significant neck LA or mass Cardiovascular: Normal rate, regular rhythm, normal heart sounds and intact distal pulses.   Pulmonary/Chest: Effort normal and breath sounds without rales or wheezing  Abdominal: Soft. Bowel sounds are normal. NT. No HSM  Musculoskeletal: Normal range of motion. Exhibits no edema.  Lymphadenopathy:  Has no cervical adenopathy.  Neurological: Pt is alert and oriented to person, place, and time. Pt has normal reflexes. No cranial nerve deficit. Motor grossly intact Skin: Skin is warm and dry. No rash noted.  Psychiatric:  Has normal mood and affect. Behavior is normal.     Assessment & Plan:

## 2015-08-22 NOTE — Progress Notes (Signed)
Pre visit review using our clinic review tool, if applicable. No additional management support is needed unless otherwise documented below in the visit note. 

## 2015-08-24 NOTE — Assessment & Plan Note (Addendum)
For lower fat, low chol diet,  to f/u any worsening symptoms or concerns, declines other tx Lab Results  Component Value Date   CHOL 219* 10/22/2014   CHOL 241* 08/23/2013   CHOL 289* 08/01/2012   Lab Results  Component Value Date   HDL 60.00 10/22/2014   HDL 68.80 08/23/2013   HDL 70.00 08/01/2012   No results found for: [facility] Lab Results  Component Value Date   TRIG 227.0* 10/22/2014   TRIG 200.0* 08/23/2013   TRIG 396.0* 08/01/2012   Lab Results  Component Value Date   CHOLHDL 4 10/22/2014   CHOLHDL 4 08/23/2013   CHOLHDL 4 08/01/2012   Lab Results  Component Value Date   LDLDIRECT 68.0 10/22/2014   LDLDIRECT 104.8 08/23/2013   LDLDIRECT 97.2 08/01/2012

## 2015-08-24 NOTE — Assessment & Plan Note (Signed)
stable overall by history and exam, recent data reviewed with pt, and pt to continue medical treatment as before,  to f/u any worsening symptoms or concerns SpO2 Readings from Last 3 Encounters:  08/22/15 95%  01/09/15 95%  12/28/14 98%

## 2015-08-24 NOTE — Assessment & Plan Note (Signed)
Mild, chronic persistent, stable overall by history and exam, recent data reviewed with pt, and pt to continue medical treatment as before,  to f/u any worsening symptoms or concerns Lab Results  Component Value Date   WBC 7.8 10/22/2014   HGB 12.9 10/22/2014   HCT 38.5 10/22/2014   MCV 85.4 10/22/2014   PLT 497.0* 10/22/2014

## 2015-11-20 ENCOUNTER — Ambulatory Visit (INDEPENDENT_AMBULATORY_CARE_PROVIDER_SITE_OTHER): Payer: Medicare Other | Admitting: Internal Medicine

## 2015-11-20 ENCOUNTER — Encounter: Payer: Self-pay | Admitting: Internal Medicine

## 2015-11-20 ENCOUNTER — Ambulatory Visit: Payer: Medicare Other | Admitting: Internal Medicine

## 2015-11-20 ENCOUNTER — Other Ambulatory Visit (INDEPENDENT_AMBULATORY_CARE_PROVIDER_SITE_OTHER): Payer: Medicare Other

## 2015-11-20 VITALS — BP 142/84 | HR 111 | Temp 98.7°F | Resp 20 | Wt 148.0 lb

## 2015-11-20 DIAGNOSIS — E781 Pure hyperglyceridemia: Secondary | ICD-10-CM

## 2015-11-20 DIAGNOSIS — Z1159 Encounter for screening for other viral diseases: Secondary | ICD-10-CM | POA: Diagnosis not present

## 2015-11-20 DIAGNOSIS — D75839 Thrombocytosis, unspecified: Secondary | ICD-10-CM

## 2015-11-20 DIAGNOSIS — J4520 Mild intermittent asthma, uncomplicated: Secondary | ICD-10-CM

## 2015-11-20 DIAGNOSIS — E785 Hyperlipidemia, unspecified: Secondary | ICD-10-CM

## 2015-11-20 DIAGNOSIS — C50919 Malignant neoplasm of unspecified site of unspecified female breast: Secondary | ICD-10-CM

## 2015-11-20 DIAGNOSIS — K219 Gastro-esophageal reflux disease without esophagitis: Secondary | ICD-10-CM

## 2015-11-20 DIAGNOSIS — Z1239 Encounter for other screening for malignant neoplasm of breast: Secondary | ICD-10-CM

## 2015-11-20 DIAGNOSIS — D473 Essential (hemorrhagic) thrombocythemia: Secondary | ICD-10-CM

## 2015-11-20 LAB — BASIC METABOLIC PANEL
BUN: 17 mg/dL (ref 6–23)
CHLORIDE: 104 meq/L (ref 96–112)
CO2: 26 meq/L (ref 19–32)
Calcium: 9.9 mg/dL (ref 8.4–10.5)
Creatinine, Ser: 0.95 mg/dL (ref 0.40–1.20)
GFR: 62.36 mL/min (ref >60.00)
Glucose, Bld: 98 mg/dL (ref 70–99)
POTASSIUM: 4 meq/L (ref 3.5–5.1)
SODIUM: 140 meq/L (ref 135–145)

## 2015-11-20 LAB — CBC WITH DIFFERENTIAL/PLATELET
Basophils Absolute: 0.1 10*3/uL (ref 0.0–0.1)
Basophils Relative: 1 % (ref 0.0–3.0)
EOS ABS: 0.1 10*3/uL (ref 0.0–0.7)
Eosinophils Relative: 1.3 % (ref 0.0–5.0)
HCT: 36.3 % (ref 36.0–46.0)
Hemoglobin: 11.9 g/dL — ABNORMAL LOW (ref 12.0–15.0)
LYMPHS ABS: 2.2 10*3/uL (ref 0.7–4.0)
Lymphocytes Relative: 19.8 % (ref 12.0–46.0)
MCHC: 32.8 g/dL (ref 30.0–36.0)
MCV: 75.4 fl — AB (ref 78.0–100.0)
MONOS PCT: 6.7 % (ref 3.0–12.0)
Monocytes Absolute: 0.8 10*3/uL (ref 0.1–1.0)
NEUTROS ABS: 8.1 10*3/uL — AB (ref 1.4–7.7)
NEUTROS PCT: 71.2 % (ref 43.0–77.0)
PLATELETS: 536 10*3/uL — AB (ref 150.0–400.0)
RBC: 4.82 Mil/uL (ref 3.87–5.11)
RDW: 18.7 % — ABNORMAL HIGH (ref 11.5–15.5)
WBC: 11.3 10*3/uL — ABNORMAL HIGH (ref 4.0–10.5)

## 2015-11-20 LAB — HEPATIC FUNCTION PANEL
ALK PHOS: 79 U/L (ref 39–117)
ALT: 18 U/L (ref 0–35)
AST: 20 U/L (ref 0–37)
Albumin: 4.5 g/dL (ref 3.5–5.2)
BILIRUBIN DIRECT: 0 mg/dL (ref 0.0–0.3)
TOTAL PROTEIN: 7.6 g/dL (ref 6.0–8.3)
Total Bilirubin: 0.4 mg/dL (ref 0.2–1.2)

## 2015-11-20 LAB — LDL CHOLESTEROL, DIRECT: Direct LDL: 66 mg/dL

## 2015-11-20 LAB — LIPID PANEL
Cholesterol: 211 mg/dL — ABNORMAL HIGH (ref 0–200)
HDL: 63.9 mg/dL (ref >39.00)
NonHDL: 146.89
Total CHOL/HDL Ratio: 3
Triglycerides: 221 mg/dL — ABNORMAL HIGH (ref 0.0–149.0)
VLDL: 44.2 mg/dL — ABNORMAL HIGH (ref 0.0–40.0)

## 2015-11-20 LAB — TSH: TSH: 3.05 u[IU]/mL (ref 0.35–4.50)

## 2015-11-20 NOTE — Assessment & Plan Note (Addendum)
Cont to work on diet, o/w stable overall by history and exam, recent data reviewed with pt, and pt to continue medical treatment as before,  to f/u any worsening symptoms or concerns Lab Results  Component Value Date   CHOL 219* 10/22/2014   HDL 60.00 10/22/2014   LDLDIRECT 68.0 10/22/2014   TRIG 227.0* 10/22/2014   CHOLHDL 4 10/22/2014   Also, ECG reviewed as per emr

## 2015-11-20 NOTE — Progress Notes (Signed)
Subjective:    Patient ID: [name] [name], female    DOB: Dec 28, 1950, 65 y.o.   MRN: [id number]  HPI  Here for yearly  f/u;  Overall doing ok;  Pt denies Chest pain, worsening SOB, DOE, wheezing, orthopnea, PND, worsening LE edema, palpitations, dizziness or syncope.  Pt denies neurological change such as new headache, facial or extremity weakness.  Pt denies polydipsia, polyuria, or low sugar symptoms. Pt states overall good compliance with treatment and medications, good tolerability, and has been trying to follow appropriate diet.  Pt denies worsening depressive symptoms, suicidal ideation or panic. No fever, night sweats, wt loss, loss of appetite, or other constitutional symptoms.  Pt states good ability with ADL's, has low fall risk, home safety reviewed and adequate, no other significant changes in hearing or vision, and only occasionally active with exercise. Bp unsuually high today, almost always < 140/90 at home, only goes up with asthma attacks. Last mammogram x 2 yrs.  Denies worsening reflux, abd pain, dysphagia, n/v, bowel change or blood. No recent blood clotting or phlebitis.   Past Medical History  Diagnosis Date  . MIGRAINE HEADACHE   . Irritable bowel syndrome   . ASTHMA   . GERD   . BREAST CANCER, HX OF 11/1979   Past Surgical History  Procedure Laterality Date  . Mastectomy  11/1979    Bilaterally & implants  . Abdominal hysterectomy  11/1979  . Tubal ligation  1975  . Tonsillectomy  1955  . Left wrist  1963    reports that she has never smoked. She does not have any smokeless tobacco history on file. She reports that she drinks alcohol. She reports that she does not use illicit drugs. family history includes Breast cancer in her mother; Colon polyps (age of onset: 75) in her sister; Coronary artery disease in her mother; Coronary artery disease (age of onset: 6) in her father; Endometriosis in her sister; Hyperlipidemia in her brother; Hypertension in her brother;  Irritable bowel syndrome in her sister; Uterine cancer in her mother. Allergies  Allergen Reactions  . Albuterol Sulfate Anaphylaxis    Pt states she has tolerated levalbuterol  . Amoxicillin-Pot Clavulanate     REACTION: ANAPHYLACTIC REACTION  Lips swell   . Fluzone     REACTION: ANAPHYLACTIC  . Influenza Vaccines Anaphylaxis  . Morphine     REACTION: ANAPHYLACTIC REACTION  . Povidone-Iodine     REACTION: HIVES/ANAPHYLACTIC WITH INJECTABLE DYE  . Sulfonamide Derivatives     REACTION: ANAPHYLACTIC REACTION  . Atrovent [Ipratropium] Other (See Comments)    Has sulfa in preservative  . Ciprofloxacin     Itchy palms  . Hyoscyamine Sulfate Hives  . Meperidine Hcl     REACTION: N\T\V  . Other     Mucomist-unable to breathe  . Pepcid [Famotidine] Other (See Comments)  . Pneumococcal Vaccines     Pt declines  . Ppd [Tuberculin Purified Protein Derivative]     Arm swelling with red streaks  . Reglan [Metoclopramide] Other (See Comments)  . Zostavax [Zoster Vaccine Live]     Pt declines   Current Outpatient Prescriptions on File Prior to Visit  Medication Sig Dispense Refill  . aspirin-acetaminophen-caffeine (EXCEDRIN MIGRAINE) 250-250-65 MG per tablet Take 1 tablet by mouth every 6 (six) hours as needed. For migraine    . Calcium Carbonate-Vit D-Min 600-200 MG-UNIT TABS Take 1 tablet by mouth 2 (two) times daily.    . cyclobenzaprine (FLEXERIL) 10 MG tablet Take 1  tablet (10 mg total) by mouth 3 (three) times daily as needed for muscle spasms. 60 tablet 2  . diphenhydrAMINE (BENADRYL) 25 MG tablet Take 25 mg by mouth every 8 (eight) hours as needed. For allergies    . EPINEPHrine (EPIPEN 2-PAK) 0.3 mg/0.3 mL IJ SOAJ injection Inject 0.3 mLs (0.3 mg total) into the muscle once. 2 Device 5  . levalbuterol (XOPENEX HFA) 45 MCG/ACT inhaler Inhale 1-2 puffs into the lungs every 6 (six) hours as needed. For shortness of breath 1 Inhaler prn  . levalbuterol (XOPENEX) 1.25 MG/3ML  nebulizer solution Take 1.25 mg by nebulization every 4 (four) hours as needed for wheezing. 72 mL 12  . Melatonin 10 MG CAPS Take 10 mg by mouth at bedtime.    . mometasone-formoterol (DULERA) 100-5 MCG/ACT AERO Inhale 2 puffs into the lungs 2 (two) times daily. Pt only uses this on PRN per Dr. Annamaria Boots.    . mometasone-formoterol (DULERA) 200-5 MCG/ACT AERO Inhale 2 puffs into the lungs 2 (two) times daily. Rinse mouth 1 Inhaler 2  . Multiple Vitamin (MULTIVITAMIN) tablet Take 1 tablet by mouth daily.      . promethazine (PHENERGAN) 25 MG tablet Take 1 tablet (25 mg total) by mouth every 6 (six) hours as needed for nausea. 30 tablet 0  . vitamin E 400 UNIT capsule Take 400 Units by mouth daily.      Marland Kitchen zolpidem (AMBIEN) 10 MG tablet Take 1 tablet (10 mg total) by mouth at bedtime. (Patient taking differently: Take 10 mg by mouth at bedtime as needed. ) 30 tablet 1   No current facility-administered medications on file prior to visit.   Review of Systems Constitutional: Negative for increased diaphoresis, other activity, appetite or siginficant weight change other than noted HENT: Negative for worsening hearing loss, ear pain, facial swelling, mouth sores and neck stiffness.   Eyes: Negative for other worsening pain, redness or visual disturbance.  Respiratory: Negative for shortness of breath and wheezing  Cardiovascular: Negative for chest pain and palpitations.  Gastrointestinal: Negative for diarrhea, blood in stool, abdominal distention or other pain Genitourinary: Negative for hematuria, flank pain or change in urine volume.  Musculoskeletal: Negative for myalgias or other joint complaints.  Skin: Negative for color change and wound or drainage.  Neurological: Negative for syncope and numbness. other than noted Hematological: Negative for adenopathy. or other swelling Psychiatric/Behavioral: Negative for hallucinations, SI, self-injury, decreased concentration or other worsening agitation.        Objective:   Physical Exam BP 142/84 mmHg  Pulse 111  Temp(Src) 98.7 F (37.1 C) (Oral)  Resp 20  Wt 148 lb (67.132 kg)  SpO2 98% VS noted,  Constitutional: Pt is oriented to person, place, and time. Appears well-developed and well-nourished, in no significant distress Head: Normocephalic and atraumatic.  Right Ear: External ear normal.  Left Ear: External ear normal.  Nose: Nose normal.  Mouth/Throat: Oropharynx is clear and moist.  Eyes: Conjunctivae and EOM are normal. Pupils are equal, round, and reactive to light.  Neck: Normal range of motion. Neck supple. No JVD present. No tracheal deviation present or significant neck LA or mass Cardiovascular: Normal rate, regular rhythm, normal heart sounds and intact distal pulses.   Pulmonary/Chest: Effort normal and breath sounds without rales or wheezing  Abdominal: Soft. Bowel sounds are normal. NT. No HSM  Musculoskeletal: Normal range of motion. Exhibits no edema.  Lymphadenopathy:  Has no cervical adenopathy.  Neurological: Pt is alert and oriented to person, place,  and time. Pt has normal reflexes. No cranial nerve deficit. Motor grossly intact Skin: Skin is warm and dry. No rash noted.  Psychiatric:  Has normal mood and affect. Behavior is normal.     Assessment & Plan:

## 2015-11-20 NOTE — Patient Instructions (Addendum)
Please continue all other medications as before, and refills have been done if requested.  Please have the pharmacy call with any other refills you may need.  Please continue your efforts at being more active, low cholesterol diet, and weight control.  You are otherwise up to date with prevention measures today.  Please keep your appointments with your specialists as you may have planned  Please contact [facility] for your routine mammogram  Please go to the LAB in the Basement (turn left off the elevator) for the tests to be done today  You will be contacted by phone if any changes need to be made immediately.  Otherwise, you will receive a letter about your results with an explanation, but please check with MyChart first.  Please remember to sign up for MyChart if you have not done so, as this will be important to you in the future with finding out test results, communicating by private email, and scheduling acute appointments online when needed.  Please return in 1 year for your yearly visit, or sooner if needed

## 2015-11-20 NOTE — Assessment & Plan Note (Signed)
stable overall by history and exam, recent data reviewed with pt, and pt to continue medical treatment as before,  to f/u any worsening symptoms or concerns.lasto2 SpO2 Readings from Last 3 Encounters:  11/20/15 98%  08/22/15 95%  01/09/15 95%

## 2015-11-20 NOTE — Assessment & Plan Note (Signed)
stable overall by history and exam, recent data reviewed with pt, and pt to continue medical treatment as before,  to f/u any worsening symptoms or concerns Lab Results  Component Value Date   WBC 7.8 10/22/2014   HGB 12.9 10/22/2014   HCT 38.5 10/22/2014   PLT 497.0* 10/22/2014   GLUCOSE 89 10/22/2014   CHOL 219* 10/22/2014   TRIG 227.0* 10/22/2014   HDL 60.00 10/22/2014   LDLDIRECT 68.0 10/22/2014   ALT 12 10/22/2014   AST 18 10/22/2014   NA 141 10/22/2014   K 4.0 10/22/2014   CL 106 10/22/2014   CREATININE 1.12 10/22/2014   BUN 17 10/22/2014   CO2 26 10/22/2014   TSH 4.68* 10/22/2014

## 2015-11-20 NOTE — Progress Notes (Signed)
Pre visit review using our clinic review tool, if applicable. No additional management support is needed unless otherwise documented below in the visit note. 

## 2015-11-20 NOTE — Assessment & Plan Note (Signed)
Lab Results  Component Value Date   WBC 7.8 10/22/2014   HGB 12.9 10/22/2014   HCT 38.5 10/22/2014   MCV 85.4 10/22/2014   PLT 497.0* 10/22/2014  asympt, chronic persistent,  to f/u any worsening symptoms or concerns

## 2015-11-21 LAB — HEPATITIS C ANTIBODY: HCV Ab: NEGATIVE

## 2015-12-07 ENCOUNTER — Emergency Department (HOSPITAL_COMMUNITY)
Admission: EM | Admit: 2015-12-07 | Discharge: 2015-12-07 | Disposition: A | Discharge status: Home / Self Care | Payer: Medicare Other | Attending: Emergency Medicine | Admitting: Emergency Medicine

## 2015-12-07 ENCOUNTER — Encounter (HOSPITAL_COMMUNITY): Payer: Self-pay | Admitting: *Deleted

## 2015-12-07 DIAGNOSIS — Y998 Other external cause status: Secondary | ICD-10-CM | POA: Insufficient documentation

## 2015-12-07 DIAGNOSIS — Y9389 Activity, other specified: Secondary | ICD-10-CM | POA: Insufficient documentation

## 2015-12-07 DIAGNOSIS — K219 Gastro-esophageal reflux disease without esophagitis: Secondary | ICD-10-CM | POA: Insufficient documentation

## 2015-12-07 DIAGNOSIS — X58XXXA Exposure to other specified factors, initial encounter: Secondary | ICD-10-CM | POA: Diagnosis not present

## 2015-12-07 DIAGNOSIS — Z853 Personal history of malignant neoplasm of breast: Secondary | ICD-10-CM | POA: Insufficient documentation

## 2015-12-07 DIAGNOSIS — Z7951 Long term (current) use of inhaled steroids: Secondary | ICD-10-CM | POA: Diagnosis not present

## 2015-12-07 DIAGNOSIS — R062 Wheezing: Secondary | ICD-10-CM | POA: Diagnosis not present

## 2015-12-07 DIAGNOSIS — Y9289 Other specified places as the place of occurrence of the external cause: Secondary | ICD-10-CM | POA: Insufficient documentation

## 2015-12-07 DIAGNOSIS — Z79899 Other long term (current) drug therapy: Secondary | ICD-10-CM | POA: Insufficient documentation

## 2015-12-07 DIAGNOSIS — Z880 Allergy status to penicillin: Secondary | ICD-10-CM | POA: Diagnosis not present

## 2015-12-07 DIAGNOSIS — L509 Urticaria, unspecified: Secondary | ICD-10-CM | POA: Diagnosis not present

## 2015-12-07 DIAGNOSIS — R000 Tachycardia, unspecified: Secondary | ICD-10-CM | POA: Insufficient documentation

## 2015-12-07 DIAGNOSIS — T7840XA Allergy, unspecified, initial encounter: Secondary | ICD-10-CM

## 2015-12-07 DIAGNOSIS — G43909 Migraine, unspecified, not intractable, without status migrainosus: Secondary | ICD-10-CM | POA: Insufficient documentation

## 2015-12-07 DIAGNOSIS — J45901 Unspecified asthma with (acute) exacerbation: Secondary | ICD-10-CM | POA: Insufficient documentation

## 2015-12-07 DIAGNOSIS — F419 Anxiety disorder, unspecified: Secondary | ICD-10-CM | POA: Diagnosis not present

## 2015-12-07 MED ORDER — LEVALBUTEROL HCL 1.25 MG/0.5ML IN NEBU
1.2500 mg | INHALATION_SOLUTION | Freq: Once | RESPIRATORY_TRACT | Status: AC
  Administered 2015-12-07: 1.25 mg via RESPIRATORY_TRACT
  Filled 2015-12-07: qty 0.5

## 2015-12-07 MED ORDER — PREDNISONE 20 MG PO TABS: ORAL_TABLET | ORAL | Status: DC

## 2015-12-07 NOTE — ED Notes (Signed)
Pt taken off of O2. Pt comfortable.

## 2015-12-07 NOTE — Discharge Instructions (Signed)
Allergies Take your first dose of prednisone tonight at 8pm. Take each subsequent dose at 8 PM for the next 3 nights after tonight. If symptoms worsen return to the emergency department. An allergy is an abnormal reaction to a substance by the body's defense system (immune system). Allergies can develop at any age. WHAT CAUSES ALLERGIES? An allergic reaction happens when the immune system mistakenly reacts to a normally harmless substance, called an allergen, as if it were harmful. The immune system releases antibodies to fight the substance. Antibodies eventually release a chemical called histamine into the bloodstream. The release of histamine is meant to protect the body from infection, but it also causes discomfort. An allergic reaction can be triggered by:  Eating an allergen.  Inhaling an allergen.  Touching an allergen. WHAT TYPES OF ALLERGIES ARE THERE? There are many types of allergies. Common types include:  Seasonal allergies. People with this type of allergy are usually allergic to substances that are only present during certain seasons, such as molds and pollens.  Food allergies.  Drug allergies.  Insect allergies.  Animal dander allergies. WHAT ARE SYMPTOMS OF ALLERGIES? Possible allergy symptoms include:  Swelling of the lips, face, tongue, mouth, or throat.  Sneezing, coughing, or wheezing.  Nasal congestion.  Tingling in the mouth.  Rash.  Itching.  Itchy, red, swollen areas of skin (hives).  Watery eyes.  Vomiting.  Diarrhea.  Dizziness.  Lightheadedness.  Fainting.  Trouble breathing or swallowing.  Chest tightness.  Rapid heartbeat. HOW ARE ALLERGIES DIAGNOSED? Allergies are diagnosed with a medical and family history and one or more of the following:  Skin tests.  Blood tests.  A food diary. A food diary is a record of all the foods and drinks you have in a day and of all the symptoms you experience.  The results of an  elimination diet. An elimination diet involves eliminating foods from your diet and then adding them back in one by one to find out if a certain food causes an allergic reaction. HOW ARE ALLERGIES TREATED? There is no cure for allergies, but allergic reactions can be treated with medicine. Severe reactions usually need to be treated at a hospital. HOW CAN REACTIONS BE PREVENTED? The best way to prevent an allergic reaction is by avoiding the substance you are allergic to. Allergy shots and medicines can also help prevent reactions in some cases. People with severe allergic reactions may be able to prevent a life-threatening reaction called anaphylaxis with a medicine given right after exposure to the allergen.   This information is not intended to replace advice given to you by your health care provider. Make sure you discuss any questions you have with your health care provider.   Document Released: 11/24/2002 Document Revised: 09/21/2014 Document Reviewed: 06/12/2014 Elsevier Interactive Patient Education [employer].

## 2015-12-07 NOTE — ED Provider Notes (Signed)
3:45 PM patient alert no distress states "I'm doing much better"  5 PM patient breathing at baseline feels ready to go home. I had lengthy discussion with patient. She does not want prescription for EpiPen as a sulfa derivative found an EpiPen has caused prior allergic reaction. She is amenable to prescription for prednisone. She is told to return if symptoms worsen. She also has soaked the next nebulizer at home  Orlie Dakin, MD 12/07/15 1703

## 2015-12-07 NOTE — ED Notes (Signed)
Pt arrived by [facility] for allergic reaction. Pt took benadryl 75mg  and epi pen prior to ems arrival. Pt given solumedrol pta and epi neb pta. Wheezing all lung fields pta.

## 2015-12-07 NOTE — ED Provider Notes (Signed)
CSN: [id number]     Arrival date & time 12/07/15  1257 History   First MD Initiated Contact with Patient 12/07/15 1303     Chief Complaint  Patient presents with  . Allergic Reaction     (Consider location/radiation/quality/duration/timing/severity/associated sxs/prior Treatment) HPI Comments: 65yo F w/ PMH including multiple medication allergies and history of allergic reactions, migraines, IBS, history of breast cancer who presents with respiratory distress. History obtained from EMS. They report that the patient was eating lunch when she had a sudden onset of respiratory distress and wheezing consistent with an allergic reaction. She took a total of 75mg  PO benadryl and gave herself an EpiPen prior to EMS arrival. EMS gave her Solu-Medrol. She was unable to tolerate albuterol or racemic epinephrine, which she told them it made her worse. She denies any new food exposure or exposure to anything that she knows she is allergic to. She states she was well this morning.  LEVEL 5 CAVEAT 2/2 RESPIRATORY DISTRESS  Patient is a 65 y.o. female presenting with allergic reaction. The history is provided by the EMS personnel and the patient.  Allergic Reaction   Past Medical History  Diagnosis Date  . MIGRAINE HEADACHE   . Irritable bowel syndrome   . ASTHMA   . GERD   . BREAST CANCER, HX OF 11/1979   Past Surgical History  Procedure Laterality Date  . Mastectomy  11/1979    Bilaterally & implants  . Abdominal hysterectomy  11/1979  . Tubal ligation  1975  . Tonsillectomy  1955  . Left wrist  1963   Family History  Problem Relation Age of Onset  . Breast cancer Mother   . Uterine cancer Mother   . Coronary artery disease Mother   . Endometriosis Sister   . Colon polyps Sister 62    1/2 sister - same dad  . Coronary artery disease Father 63    CABG age 23  . Irritable bowel syndrome Sister     1/2 sister, C prone  . Hyperlipidemia Brother     1/2 bro, same dad  . Hypertension  Brother     1/2 bro, same dad   Social History  Substance Use Topics  . Smoking status: Never Smoker   . Smokeless tobacco: None  . Alcohol Use: 0.0 oz/week    0 Standard drinks or equivalent per week   OB History    No data available     Review of Systems  Unable to perform ROS: Severe respiratory distress      Allergies  Albuterol sulfate; Amoxicillin-pot clavulanate; Fluzone; Influenza vaccines; Morphine; Povidone-iodine; Sulfonamide derivatives; Atrovent; Ciprofloxacin; Hyoscyamine sulfate; Meperidine hcl; Other; Pepcid; Pneumococcal vaccines; Ppd; Reglan; and Zostavax  Home Medications   Prior to Admission medications   Medication Sig Start Date End Date Taking? Authorizing Provider  Ascorbic Acid (VITAMIN C) 1000 MG tablet Take 1,000 mg by mouth daily.   Yes Historical Provider, MD  aspirin-acetaminophen-caffeine (EXCEDRIN MIGRAINE) [phone] MG per tablet Take 1 tablet by mouth every 6 (six) hours as needed. For migraine   Yes Historical Provider, MD  Calcium Carbonate-Vit D-Min 600-200 MG-UNIT TABS Take 1 tablet by mouth 2 (two) times daily.   Yes Historical Provider, MD  Cyanocobalamin (VITAMIN B 12 PO) Take 1 tablet by mouth daily.   Yes Historical Provider, MD  cyclobenzaprine (FLEXERIL) 10 MG tablet Take 1 tablet (10 mg total) by mouth 3 (three) times daily as needed for muscle spasms. 10/22/14  Yes Rowe Clack,  MD  diphenhydrAMINE (BENADRYL) 25 MG tablet Take 25 mg by mouth every 8 (eight) hours as needed. For allergies   Yes Historical Provider, MD  EPINEPHrine (EPIPEN 2-PAK) 0.3 mg/0.3 mL IJ SOAJ injection Inject 0.3 mLs (0.3 mg total) into the muscle once. 06/01/14  Yes Rowe Clack, MD  levalbuterol [facility] HFA) 45 MCG/ACT inhaler Inhale 1-2 puffs into the lungs every 6 (six) hours as needed. For shortness of breath 01/09/15  Yes Deneise Lever, MD  levalbuterol (XOPENEX) 1.25 MG/3ML nebulizer solution Take 1.25 mg by nebulization every 4 (four) hours  as needed for wheezing. 05/01/14  Yes Rowe Clack, MD  Melatonin 10 MG CAPS Take 10 mg by mouth at bedtime.   Yes Historical Provider, MD  mometasone-formoterol (DULERA) 200-5 MCG/ACT AERO Inhale 2 puffs into the lungs 2 (two) times daily. Rinse mouth 08/23/13  Yes Rowe Clack, MD  Multiple Vitamin (MULTIVITAMIN) tablet Take 1 tablet by mouth daily.     Yes Historical Provider, MD  promethazine (PHENERGAN) 25 MG tablet Take 1 tablet (25 mg total) by mouth every 6 (six) hours as needed for nausea. 03/09/13  Yes Rowe Clack, MD  vitamin E 400 UNIT capsule Take 400 Units by mouth daily.     Yes Historical Provider, MD  zolpidem (AMBIEN) 10 MG tablet Take 1 tablet (10 mg total) by mouth at bedtime. Patient taking differently: Take 10 mg by mouth at bedtime as needed.  06/06/15  Yes Hendricks Limes, MD  mometasone-formoterol [facility]) 100-5 MCG/ACT AERO Inhale 2 puffs into the lungs 2 (two) times daily. Pt only uses this on PRN per Dr. Annamaria Boots.    Historical Provider, MD   BP 104/63 mmHg  Pulse 114  Temp(Src) 97.9 F (36.6 C) (Oral)  Resp 22  SpO2 100% Physical Exam  Constitutional: She is oriented to person, place, and time. She appears well-developed and well-nourished. She appears distressed.  Wheezing and coughing  HENT:  Head: Normocephalic and atraumatic.  Moist mucous membranes  Eyes: Conjunctivae are normal. Pupils are equal, round, and reactive to light.  Neck: Neck supple.  Cardiovascular: Regular rhythm and normal heart sounds.  Tachycardia present.   No murmur heard. Pulmonary/Chest: She is in respiratory distress.  Increased WOB, mildly diminished BS in bases, some expiratory wheezes  Abdominal: Soft. Bowel sounds are normal. She exhibits no distension. There is no tenderness.  Musculoskeletal: She exhibits no edema.  Neurological: She is alert and oriented to person, place, and time.  Fluent speech  Skin: Skin is warm and dry.  Psychiatric: Judgment normal.   Anxious, distressed  Nursing note and vitals reviewed.   ED Course  Procedures (including critical care time) Labs Review Labs Reviewed - No data to display  Imaging Review No results found.    EKG Interpretation None     Medications  levalbuterol (XOPENEX) nebulizer solution 1.25 mg (1.25 mg Nebulization Given by Other 12/07/15 1307)  levalbuterol (XOPENEX) nebulizer solution 1.25 mg (1.25 mg Nebulization Given 12/07/15 1349)  levalbuterol (XOPENEX) nebulizer solution 1.25 mg (1.25 mg Nebulization Given 12/07/15 1411)    MDM   Final diagnoses:  Allergic reaction, initial encounter  Wheezing    Patient with extensive allergic history presents with allergic reaction to unknown source that began during lunch today. She had taken Benadryl and epinephrine and EMS gave her Solu-Medrol. She told them that albuterol and racemic epinephrine made her worse in the ambulance. On arrival, she was sitting forward with tachypnea, increased work of  breathing, and in respiratory distress. Vital signs notable for mild tachycardia, O2 sat 100% on nonrebreather. She had mildly diminished breath sounds bilaterally and some expiratory wheezes but was moving adequate air. No rash or facial swelling. Gave the patient xopenex which improved her symptoms. Later after family members arrived, pt became tachypneic again, gave repeat xopenex as pt has already received steroids and is allergic to pepcid. She is not demonstrating any other signs of anaphylaxis aside from wheezing, therefore I do not feel she needs repeat dose of epi at this time.  Patient has remained stable after levalbuterol. We will observe for 4 hours and if she remains symptom-free, I anticipate discharge home. I am signing out to oncoming provider.  Sharlett Iles, MD 12/07/15 [phone]

## 2016-01-09 ENCOUNTER — Encounter: Payer: Self-pay | Admitting: Internal Medicine

## 2016-01-09 ENCOUNTER — Ambulatory Visit (INDEPENDENT_AMBULATORY_CARE_PROVIDER_SITE_OTHER): Payer: Medicare Other | Admitting: Internal Medicine

## 2016-01-09 VITALS — BP 118/70 | HR 88 | Ht 64.0 in | Wt 147.4 lb

## 2016-01-09 DIAGNOSIS — J4540 Moderate persistent asthma, uncomplicated: Secondary | ICD-10-CM

## 2016-01-09 DIAGNOSIS — J4521 Mild intermittent asthma with (acute) exacerbation: Secondary | ICD-10-CM

## 2016-01-09 NOTE — Progress Notes (Signed)
Patient ID: [name] [name], female    DOB: Jun 07, 1951, 65 y.o.   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 She comes now to re-establish after ER visit. PCP Dr Asa Lente. Husband is here.  She was feeling well 2 days ago. As she walked from outdoor heat into [employer] building as she often does, she experienced acute chest tightness, cough. She had just spent 2 hours in a patient's house which had recently been sprayed for insects. She was treated at [facility] ER and discharged taking prednisone 20 mg daily x 3 days. Still feels tight, not fully broken. Denies nasal symptoms with this episode. No chest pain, fever, sputum. Some dry cough. Does not usually feel reflux often. Has had 2-3 episodes of bronchitis this year, treated prednisone. Used Epipen in March after angioedema from ?shrimp. Medical hx includes GERD, IBS, hx breast cancer. Reports med allergies include albuterol, augmentin, morphine, sulfa, Singulair.  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 She comes now to re-establish after ER visit. PCP Dr Asa Lente. Husband is here.  2 episodes of bronchitis since Christmas. Some increased shortness of breath today but nothing really acute. Strong odors or trigger. She is really focused on avoiding any medication that might have a sulfite and I tried to explain the difference between sulfa antibiotics, sulfite preservatives and molecular sulfur. She expects these to cause shortness of breath, swelling and wheeze. She is taking Dulera 100 twice daily and Xopenex HFA about once daily.  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 She comes now to re-establish after ER visit. PCP Dr Asa Lente. Husband here FOLLOWS FOR: was seen at [facility] back in November 2013 for asthma flare up. Now his nebulizer  machine with Xopenex. Uses Dulera intermittently which we discussed. Triggers include respiratory irritants including cigarette smoke when she makes home visits for her job. No known exposure to TB but TB check required for her job. She has a history of an immediate intense local reaction to PPD skin test which she blames on sulfa allergy Asthma control now good.  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  Husband here She says she went to emergency room for acute asthma triggered by "sulfa in broccoli". Today feels well. She insists she only tolerates Xopenex as a bronchodilator when needed for rescue. Uses either Dulera 100 or 200 for intervals as needed. Occasionally puts up with mild wheeze. Office Spirometry 01/09/2016-within normal limits  ROS-see HPI Constitutional:   No-   weight loss, night sweats, fevers, chills, fatigue, lassitude. HEENT:   No-  headaches, difficulty swallowing, tooth/dental problems, sore throat,       No-  sneezing, itching, ear ache, nasal congestion, post nasal drip,  CV:  No-   chest pain, orthopnea, PND, swelling in lower extremities, anasarca,  dizziness, palpitations Resp: No-   shortness of breath with exertion or at rest.              No-   productive cough,  No non-productive cough,  No- coughing  up of blood.              No-   change in color of mucus.  + wheezing.   Skin: No-   rash or lesions. GI:  No-   heartburn, indigestion, abdominal pain, nausea, vomiting,  GU:  MS:  No-   joint pain or swelling. Neuro-     nothing unusual Psych:  No- change in  mood or affect. No depression or anxiety.  No memory loss.   Objective:   Physical Exam General- Alert, Oriented, Affect-appropriate, Distress- none acute Skin- rash-none, lesions- none, excoriation- none Lymphadenopathy- none Head- atraumatic            Eyes- Gross vision intact, PERRLA, conjunctivae clear secretions            Ears- Hearing, canals            Nose- Clear, No-Septal dev, mucus, polyps, erosion, perforation             Throat- Mallampati II , mucosa red , drainage- none, tonsils- atrophic , + active gag.  Neck- flexible , trachea midline, no stridor , thyroid nl, carotid no bruit Chest - symmetrical excursion , unlabored           Heart/CV- RRR , no murmur , no gallop  , no rub, nl s1 s2                           - JVD- none , edema- none, stasis changes- none, varices- none           Lung- clear to P&A,wheeze- none, cough- none , dullness-none, rub- none           Chest wall-  Abd-  Br/ Gen/ Rectal- Not done, not indicated Extrem- cyanosis- none, clubbing, none, atrophy- none, strength- nl Neuro- grossly intact to observation

## 2016-01-09 NOTE — Patient Instructions (Addendum)
Order- office spirometry    Dx asthma moderate persistent  Please call for refills as needed

## 2016-01-12 NOTE — Assessment & Plan Note (Signed)
She describes abrupt exacerbations with unusual triggers. 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 Plan-call for medication refills where needed. Avoid triggers.

## 2016-06-03 ENCOUNTER — Other Ambulatory Visit: Payer: Self-pay | Admitting: Internal Medicine

## 2016-06-04 NOTE — Telephone Encounter (Signed)
Done hardcopy to Corinne  

## 2016-06-05 NOTE — Telephone Encounter (Signed)
faxed

## 2016-09-11 ENCOUNTER — Ambulatory Visit (INDEPENDENT_AMBULATORY_CARE_PROVIDER_SITE_OTHER): Payer: Medicare Other | Admitting: Internal Medicine

## 2016-09-11 ENCOUNTER — Encounter: Payer: Self-pay | Admitting: Internal Medicine

## 2016-09-11 DIAGNOSIS — J069 Acute upper respiratory infection, unspecified: Secondary | ICD-10-CM

## 2016-09-11 DIAGNOSIS — J45909 Unspecified asthma, uncomplicated: Secondary | ICD-10-CM

## 2016-09-11 MED ORDER — PROMETHAZINE-CODEINE 6.25-10 MG/5ML PO SYRP: 5.0000 mL | ORAL_SOLUTION | Freq: Four times a day (QID) | ORAL | 0 refills | Status: DC | PRN

## 2016-09-11 MED ORDER — LEVALBUTEROL HCL 1.25 MG/3ML IN NEBU: 1.2500 mg | INHALATION_SOLUTION | RESPIRATORY_TRACT | 12 refills | Status: DC | PRN

## 2016-09-11 MED ORDER — LEVALBUTEROL TARTRATE 45 MCG/ACT IN AERO: 1.0000 | INHALATION_SPRAY | Freq: Four times a day (QID) | RESPIRATORY_TRACT | 99 refills | Status: DC | PRN

## 2016-09-11 MED ORDER — MOMETASONE FURO-FORMOTEROL FUM 100-5 MCG/ACT IN AERO: 2.0000 | INHALATION_SPRAY | Freq: Two times a day (BID) | RESPIRATORY_TRACT | 12 refills | Status: DC

## 2016-09-11 MED ORDER — AZITHROMYCIN 250 MG PO TABS: ORAL_TABLET | ORAL | 0 refills | Status: DC

## 2016-09-11 MED ORDER — EPINEPHRINE 0.3 MG/0.3ML IJ SOAJ: 0.3000 mg | Freq: Once | INTRAMUSCULAR | 12 refills | Status: DC

## 2016-09-11 NOTE — Assessment & Plan Note (Signed)
Early exacerbation associated with a viral syndrome refill Xopenex HFA and nebulizer solution, Dulera and EpiPen. It remains important to avoid any products with sulfite preservatives. We're also giving codeine cough syrup to keep available.

## 2016-09-11 NOTE — Patient Instructions (Signed)
Scripts printed for refills and also for Zpak and cough syrup to hold if needed  Comfort measures- salt water rinse/ gargle, throat lozenges, etc are fine if needed. Stay well hydrated.

## 2016-09-11 NOTE — Progress Notes (Signed)
Patient ID: [name] [name], female    DOB: 26-Jan-1951, 65 y.o.   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  Husband here She says she went to emergency room for acute asthma triggered by "sulfa in broccoli". Today feels well. She insists she only tolerates Xopenex as a bronchodilator when needed for rescue. Uses either Dulera 100 or 200 for intervals as needed. Occasionally puts up with mild wheeze.  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 Uses Dulera 100, Xopenex by nebulizer and HFA. Has EpiPen. She carefully avoids sulfite preservatives in medications and other products, convinced this is a trigger for her. We don't have clear documentation to distinguish between true anaphylaxis and anxiety/panic associated with early symptoms. Needs refill of her routine meds. Says she has to stay with [facility] because of the preservative issue and anticipates we will need to do a prior authorization to continue this. Now 2 days ago started progressive nasal congestion, head congestion, some chest tightness without fever or purulent sputum.  ROS-see HPI Constitutional:   No-   weight loss, night sweats, fevers, chills, fatigue, lassitude. HEENT:   No-  headaches, difficulty swallowing, tooth/dental problems, sore throat,       No-  sneezing, itching, ear ache, +nasal  congestion, +post nasal drip,  CV:  No-   chest pain, orthopnea, PND, swelling in lower extremities, anasarca,  dizziness, palpitations Resp: No-   shortness of breath with exertion or at rest.              No-   productive cough, + non-productive cough,  No- coughing up of blood.              No-   change in color of mucus.  + wheezing.   Skin: No-   rash or lesions. GI:  No-   heartburn, indigestion, abdominal pain, nausea, vomiting,  GU:  MS:  No-   joint pain or swelling. Neuro-     nothing unusual Psych:  No- change in mood or affect. No depression or anxiety.  No memory loss.   Objective:   Physical Exam General- Alert, Oriented, Affect-appropriate, Distress- none acute Skin- rash-none, lesions- none, excoriation- none Lymphadenopathy- none Head- atraumatic            Eyes- Gross vision intact, PERRLA, conjunctivae clear secretions            Ears- Hearing, canals            Nose- + turbinate edema, No-Septal dev, mucus + bridging, no-polyps, erosion, perforation             Throat- Mallampati II , mucosa red , drainage- none, tonsils- atrophic , + active gag.  Neck- flexible , trachea midline, no stridor , thyroid nl, carotid no bruit Chest - symmetrical excursion , unlabored           Heart/CV- RRR , no murmur , no gallop  , no rub, nl s1 s2                           -  JVD- none , edema- none, stasis changes- none, varices- none           Lung- clear to P&A,wheeze- none, cough- + dry , dullness-none, rub- none           Chest wall-  Abd-  Br/ Gen/ Rectal- Not done, not indicated Extrem- cyanosis- none, clubbing, none, atrophy- none, strength- nl Neuro- grossly intact to observation

## 2016-09-11 NOTE — Assessment & Plan Note (Signed)
Viral syndrome. Because of the long weekend we are giving Z-Pak to hold in case secretions turn distinctly purulent but she won't use it otherwise. Recommended Afrin, saline rinse, OTC symptomatic therapy as tolerated and useful.

## 2016-10-14 ENCOUNTER — Other Ambulatory Visit: Payer: Self-pay | Admitting: Internal Medicine

## 2016-10-14 NOTE — Telephone Encounter (Signed)
Called and spoke with pt and she is aware that we will call the pharmacy and get the information we need faxed to [place].    I have called the pharmacy and they will fax over the information for these medications so the PA's can be done.  They will fax this info to the fax up front.  Will hold message in triage until information is received.

## 2016-10-15 NOTE — Telephone Encounter (Signed)
PA fax was received on Dulera. I called [facility] to make sure that this was the only medication that needed a PA. I was informed the Xopenex does not need a PA, only the [facility] does. PA was started on Cover My Meds. Key: [id number]. PA determination could take up to 72 hours.

## 2016-10-19 ENCOUNTER — Other Ambulatory Visit: Payer: Self-pay | Admitting: Internal Medicine

## 2016-10-19 DIAGNOSIS — R5383 Other fatigue: Secondary | ICD-10-CM

## 2016-10-19 NOTE — Telephone Encounter (Signed)
Visited [email or web address] to check status of Dulera-   Outcome  Approved on February 1  Request Reference Number: [id number]. DULERA AER [id number] is approved through 09/13/2017. For further questions, call [phone].   Spoke with pt, aware of approval. Nothing further needed.

## 2017-01-18 ENCOUNTER — Telehealth: Payer: Self-pay | Admitting: Internal Medicine

## 2017-01-18 DIAGNOSIS — N644 Mastodynia: Secondary | ICD-10-CM

## 2017-01-18 NOTE — Telephone Encounter (Signed)
Pt called stating that she would like to speak with you regarding needing a prior auth for a possible explant that will be done in [place]. The pt would like for you to call her. Pt call back number is [phone]

## 2017-01-19 NOTE — Telephone Encounter (Signed)
Followed up with pt, however I had to leave a VM.

## 2017-01-19 NOTE — Telephone Encounter (Signed)
[place] for referral - done per emr

## 2017-01-19 NOTE — Addendum Note (Signed)
Addended by: Biagio Borg on: 01/19/2017 12:16 PM   Modules accepted: Orders

## 2017-01-19 NOTE — Telephone Encounter (Signed)
Referral needed for Dr. Wayland Denis, a plastic surgeon in [place] for an explant. She would like to know would she need an appt with you before a referral could be made. Please advise.

## 2017-02-03 ENCOUNTER — Encounter: Payer: Self-pay | Admitting: Internal Medicine

## 2017-02-03 ENCOUNTER — Other Ambulatory Visit (INDEPENDENT_AMBULATORY_CARE_PROVIDER_SITE_OTHER): Payer: Medicare Other

## 2017-02-03 ENCOUNTER — Ambulatory Visit (INDEPENDENT_AMBULATORY_CARE_PROVIDER_SITE_OTHER): Payer: Medicare Other | Admitting: Internal Medicine

## 2017-02-03 ENCOUNTER — Ambulatory Visit (INDEPENDENT_AMBULATORY_CARE_PROVIDER_SITE_OTHER)
Admission: RE | Admit: 2017-02-03 | Discharge: 2017-02-03 | Disposition: A | Discharge status: Home / Self Care | Payer: Medicare Other | Source: Ambulatory Visit | Attending: Internal Medicine | Admitting: Internal Medicine

## 2017-02-03 VITALS — BP 118/78 | HR 92 | Ht 60.0 in | Wt 167.0 lb

## 2017-02-03 DIAGNOSIS — D473 Essential (hemorrhagic) thrombocythemia: Secondary | ICD-10-CM

## 2017-02-03 DIAGNOSIS — Z0000 Encounter for general adult medical examination without abnormal findings: Secondary | ICD-10-CM

## 2017-02-03 DIAGNOSIS — Z0001 Encounter for general adult medical examination with abnormal findings: Secondary | ICD-10-CM | POA: Insufficient documentation

## 2017-02-03 DIAGNOSIS — M85859 Other specified disorders of bone density and structure, unspecified thigh: Secondary | ICD-10-CM

## 2017-02-03 DIAGNOSIS — M85851 Other specified disorders of bone density and structure, right thigh: Secondary | ICD-10-CM

## 2017-02-03 DIAGNOSIS — Z853 Personal history of malignant neoplasm of breast: Secondary | ICD-10-CM | POA: Diagnosis not present

## 2017-02-03 DIAGNOSIS — M8580 Other specified disorders of bone density and structure, unspecified site: Secondary | ICD-10-CM | POA: Insufficient documentation

## 2017-02-03 DIAGNOSIS — T859XXA Unspecified complication of internal prosthetic device, implant and graft, initial encounter: Secondary | ICD-10-CM

## 2017-02-03 DIAGNOSIS — D75839 Thrombocytosis, unspecified: Secondary | ICD-10-CM

## 2017-02-03 LAB — LIPID PANEL
CHOL/HDL RATIO: 3
Cholesterol: 236 mg/dL — ABNORMAL HIGH (ref 0–200)
HDL: 68.7 mg/dL (ref >39.00)
LDL Cholesterol: 133 mg/dL — ABNORMAL HIGH (ref 0–99)
NONHDL: 167.42
Triglycerides: 173 mg/dL — ABNORMAL HIGH (ref 0.0–149.0)
VLDL: 34.6 mg/dL (ref 0.0–40.0)

## 2017-02-03 LAB — CBC WITH DIFFERENTIAL/PLATELET
BASOS PCT: 1.7 % (ref 0.0–3.0)
Basophils Absolute: 0.1 10*3/uL (ref 0.0–0.1)
EOS ABS: 0.3 10*3/uL (ref 0.0–0.7)
Eosinophils Relative: 4.2 % (ref 0.0–5.0)
HEMATOCRIT: 41 % (ref 36.0–46.0)
Hemoglobin: 13.6 g/dL (ref 12.0–15.0)
LYMPHS ABS: 2.5 10*3/uL (ref 0.7–4.0)
LYMPHS PCT: 42.2 % (ref 12.0–46.0)
MCHC: 33 g/dL (ref 30.0–36.0)
MCV: 85.3 fl (ref 78.0–100.0)
MONOS PCT: 7.7 % (ref 3.0–12.0)
Monocytes Absolute: 0.5 10*3/uL (ref 0.1–1.0)
NEUTROS ABS: 2.6 10*3/uL (ref 1.4–7.7)
NEUTROS PCT: 44.2 % (ref 43.0–77.0)
PLATELETS: 522 10*3/uL — AB (ref 150.0–400.0)
RBC: 4.81 Mil/uL (ref 3.87–5.11)
RDW: 15 % (ref 11.5–15.5)
WBC: 6 10*3/uL (ref 4.0–10.5)

## 2017-02-03 LAB — BASIC METABOLIC PANEL
BUN: 15 mg/dL (ref 6–23)
CHLORIDE: 106 meq/L (ref 96–112)
CO2: 26 meq/L (ref 19–32)
CREATININE: 1.07 mg/dL (ref 0.40–1.20)
Calcium: 10 mg/dL (ref 8.4–10.5)
GFR: 54.16 mL/min — ABNORMAL LOW (ref >60.00)
Glucose, Bld: 99 mg/dL (ref 70–99)
Potassium: 4.3 mEq/L (ref 3.5–5.1)
SODIUM: 142 meq/L (ref 135–145)

## 2017-02-03 LAB — HEPATIC FUNCTION PANEL
ALK PHOS: 58 U/L (ref 39–117)
ALT: 14 U/L (ref 0–35)
AST: 17 U/L (ref 0–37)
Albumin: 4.6 g/dL (ref 3.5–5.2)
BILIRUBIN DIRECT: 0.1 mg/dL (ref 0.0–0.3)
BILIRUBIN TOTAL: 0.3 mg/dL (ref 0.2–1.2)
TOTAL PROTEIN: 7.3 g/dL (ref 6.0–8.3)

## 2017-02-03 LAB — TSH: TSH: 2.93 u[IU]/mL (ref 0.35–4.50)

## 2017-02-03 NOTE — Patient Instructions (Addendum)
Please schedule the bone density test before leaving today at the scheduling desk (where you check out)  Please continue all other medications as before, and refills have been done if requested.  Please have the pharmacy call with any other refills you may need.  Please continue your efforts at being more active, low cholesterol diet, and weight control.  You are otherwise up to date with prevention measures today.  Please keep your appointments with your specialists as you may have planned  You will be contacted regarding the referral for: Plastic surgury  Please go to the LAB in the Basement (turn left off the elevator) for the tests to be done today  You will be contacted by phone if any changes need to be made immediately.  Otherwise, you will receive a letter about your results with an explanation, but please check with MyChart first.  Please remember to sign up for MyChart if you have not done so, as this will be important to you in the future with finding out test results, communicating by private email, and scheduling acute appointments online when needed.  Please return in 1 year for your yearly visit, or sooner if needed, with Lab testing done 3-5 days before

## 2017-02-03 NOTE — Assessment & Plan Note (Signed)
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

## 2017-02-03 NOTE — Progress Notes (Signed)
Subjective:    Patient ID: [name] [name], female    DOB: Jul 26, 1951, 66 y.o.   MRN: [id number]  HPI  Here for wellness and f/u;  Overall doing ok;  Pt denies Chest pain, worsening SOB, DOE, wheezing, orthopnea, PND, worsening LE edema, palpitations, dizziness or syncope.  Pt denies neurological change such as new headache, facial or extremity weakness.  Pt denies polydipsia, polyuria, or low sugar symptoms. Pt states overall good compliance with treatment and medications, good tolerability, and has been trying to follow appropriate diet.  Pt denies worsening depressive symptoms, suicidal ideation or panic. No fever, night sweats, wt loss, loss of appetite, or other constitutional symptoms.  Pt states good ability with ADL's, has low fall risk, home safety reviewed and adequate, no other significant changes in hearing or vision, and only occasionally active with exercise.  02 sat low but feels exam is defiicoient in some way and has "cold" fingers - denies any symptoms such as wheezing, sob, doe. Past Medical History:  Diagnosis Date  . ASTHMA   . BREAST CANCER, HX OF 11/1979  . GERD   . Irritable bowel syndrome   . MIGRAINE HEADACHE    Past Surgical History:  Procedure Laterality Date  . ABDOMINAL HYSTERECTOMY  11/1979  . Left wrist  1963  . MASTECTOMY  11/1979   Bilaterally & implants  . TONSILLECTOMY  1955  . TUBAL LIGATION  1975    reports that she has never smoked. She has never used smokeless tobacco. She reports that she drinks alcohol. She reports that she does not use drugs. family history includes Breast cancer in her mother; Colon polyps (age of onset: 70) in her sister; Coronary artery disease in her mother; Coronary artery disease (age of onset: 6) in her father; Endometriosis in her sister; Hyperlipidemia in her brother; Hypertension in her brother; Irritable bowel syndrome in her sister; Uterine cancer in her mother. Allergies  Allergen Reactions  . Albuterol Sulfate  Anaphylaxis    Pt states she has tolerated levalbuterol  . Amoxicillin-Pot Clavulanate     REACTION: ANAPHYLACTIC REACTION  Lips swell   . Epipen [Epinephrine] Other (See Comments)    Allergic to preservatives in Epipen with caution!!!  . Fluzone     REACTION: ANAPHYLACTIC  . Influenza Vaccines Anaphylaxis  . Morphine     REACTION: ANAPHYLACTIC REACTION  . Povidone-Iodine     REACTION: HIVES/ANAPHYLACTIC WITH INJECTABLE DYE  . Sulfonamide Derivatives     REACTION: ANAPHYLACTIC REACTION  . Atrovent [Ipratropium] Other (See Comments)    Has sulfa in preservative  . Ciprofloxacin     Itchy palms  . Hyoscyamine Sulfate Hives  . Meperidine Hcl     REACTION: N\T\V  . Other     Mucomist-unable to breathe  . Pepcid [Famotidine] Other (See Comments)  . Pneumococcal Vaccines     Pt declines  . Ppd [Tuberculin Purified Protein Derivative]     Arm swelling with red streaks  . Reglan [Metoclopramide] Other (See Comments)  . Zostavax [Zoster Vaccine Live]     Pt declines   Current Outpatient Prescriptions on File Prior to Visit  Medication Sig Dispense Refill  . Ascorbic Acid (VITAMIN C) 1000 MG tablet Take 1,000 mg by mouth daily.    Marland Kitchen aspirin-acetaminophen-caffeine (EXCEDRIN MIGRAINE) 250-250-65 MG per tablet Take 1 tablet by mouth every 6 (six) hours as needed. For migraine    . Calcium Carbonate-Vit D-Min 600-200 MG-UNIT TABS Take 1 tablet by mouth 2 (two) times  daily.    . Cyanocobalamin (VITAMIN B 12 PO) Take 1 tablet by mouth daily.    . cyclobenzaprine (FLEXERIL) 10 MG tablet Take 1 tablet (10 mg total) by mouth 3 (three) times daily as needed for muscle spasms. Yearly physical due in March must see MD for refills 60 tablet 0  . diphenhydrAMINE (BENADRYL) 25 MG tablet Take 25 mg by mouth every 8 (eight) hours as needed. For allergies    . levalbuterol (XOPENEX HFA) 45 MCG/ACT inhaler Inhale 1-2 puffs into the lungs every 6 (six) hours as needed. For shortness of breath 1 Inhaler  prn  . levalbuterol (XOPENEX) 1.25 MG/3ML nebulizer solution Take 1.25 mg by nebulization every 4 (four) hours as needed for wheezing. 72 mL 12  . Melatonin 10 MG CAPS Take 10 mg by mouth at bedtime.    . mometasone-formoterol (DULERA) 200-5 MCG/ACT AERO Inhale 2 puffs into the lungs 2 (two) times daily. Rinse mouth 1 Inhaler 2  . Multiple Vitamin (MULTIVITAMIN) tablet Take 1 tablet by mouth daily.      . promethazine (PHENERGAN) 25 MG tablet Take 1 tablet (25 mg total) by mouth every 6 (six) hours as needed for nausea. 30 tablet 0  . promethazine-codeine (PHENERGAN WITH CODEINE) 6.25-10 MG/5ML syrup Take 5 mLs by mouth every 6 (six) hours as needed for cough. 200 mL 0  . vitamin E 400 UNIT capsule Take 400 Units by mouth daily.      Marland Kitchen zolpidem (AMBIEN) 10 MG tablet TAKE ONE TABLET BY MOUTH EVERY DAY AT BEDTIME 30 tablet 2  . EPINEPHrine (EPIPEN 2-PAK) 0.3 mg/0.3 mL IJ SOAJ injection Inject 0.3 mLs (0.3 mg total) into the muscle once. 2 Device 12   No current facility-administered medications on file prior to visit.    Review of Systems Constitutional: Negative for other unusual diaphoresis, sweats, appetite or weight changes HENT: Negative for other worsening hearing loss, ear pain, facial swelling, mouth sores or neck stiffness.   Eyes: Negative for other worsening pain, redness or other visual disturbance.  Respiratory: Negative for other stridor or swelling Cardiovascular: Negative for other palpitations or other chest pain  Gastrointestinal: Negative for worsening diarrhea or loose stools, blood in stool, distention or other pain Genitourinary: Negative for hematuria, flank pain or other change in urine volume.  Musculoskeletal: Negative for myalgias or other joint swelling.  Skin: Negative for other color change, or other wound or worsening drainage.  Neurological: Negative for other syncope or numbness. Hematological: Negative for other adenopathy or swelling Psychiatric/Behavioral:  Negative for hallucinations, other worsening agitation, SI, self-injury, or new decreased concentration All other system neg per pt    Objective:   Physical Exam BP 118/78   Pulse 92   SpO2 (!) 80%  - ? Nail polish VS noted,  Constitutional: Pt is oriented to person, place, and time. Appears well-developed and well-nourished, in no significant distress and comfortable Head: Normocephalic and atraumatic  Eyes: Conjunctivae and EOM are normal. Pupils are equal, round, and reactive to light Right Ear: External ear normal without discharge Left Ear: External ear normal without discharge Nose: Nose without discharge or deformity Mouth/Throat: Oropharynx is without other ulcerations and moist  Neck: Normal range of motion. Neck supple. No JVD present. No tracheal deviation present or significant neck LA or mass Cardiovascular: Normal rate, regular rhythm, normal heart sounds and intact distal pulses.   Pulmonary/Chest: WOB normal and breath sounds without rales or wheezing  Breasts: s/p bilat implants Abdominal: Soft. Bowel sounds are  normal. NT. No HSM  Musculoskeletal: Normal range of motion. Exhibits no edema, hand OA changes noted Lymphadenopathy: Has no other cervical adenopathy.  Neurological: Pt is alert and oriented to person, place, and time. Pt has normal reflexes. No cranial nerve deficit. Motor grossly intact, Gait intact Skin: Skin is warm and dry. No rash noted or new ulcerations Psychiatric:  Has normal mood and affect. Behavior is normal without agitation No other exam findings    Assessment & Plan:

## 2017-02-03 NOTE — Assessment & Plan Note (Signed)
Chronic mild in the 500's, unclear etiology, no heme s/s, for f/u today

## 2017-02-03 NOTE — Assessment & Plan Note (Signed)

## 2017-02-03 NOTE — Assessment & Plan Note (Signed)
For dxa as she is due

## 2017-02-12 ENCOUNTER — Other Ambulatory Visit: Payer: Self-pay | Admitting: Internal Medicine

## 2017-02-12 NOTE — Telephone Encounter (Signed)
faxed

## 2017-02-12 NOTE — Telephone Encounter (Signed)
Done hardcopy to Shirron  

## 2017-03-10 DIAGNOSIS — C50919 Malignant neoplasm of unspecified site of unspecified female breast: Secondary | ICD-10-CM | POA: Diagnosis not present

## 2017-03-11 ENCOUNTER — Other Ambulatory Visit: Payer: Self-pay | Admitting: Internal Medicine

## 2017-03-11 DIAGNOSIS — Z1231 Encounter for screening mammogram for malignant neoplasm of breast: Secondary | ICD-10-CM

## 2017-03-23 ENCOUNTER — Ambulatory Visit
Admission: RE | Admit: 2017-03-23 | Discharge: 2017-03-23 | Disposition: A | Discharge status: Home / Self Care | Payer: Medicare Other | Source: Ambulatory Visit | Attending: Internal Medicine | Admitting: Internal Medicine

## 2017-03-23 DIAGNOSIS — Z1231 Encounter for screening mammogram for malignant neoplasm of breast: Secondary | ICD-10-CM

## 2017-04-21 DIAGNOSIS — T8543XA Leakage of breast prosthesis and implant, initial encounter: Secondary | ICD-10-CM | POA: Diagnosis not present

## 2017-04-27 DIAGNOSIS — Z91041 Radiographic dye allergy status: Secondary | ICD-10-CM | POA: Diagnosis not present

## 2017-04-27 DIAGNOSIS — Z887 Allergy status to serum and vaccine status: Secondary | ICD-10-CM | POA: Diagnosis not present

## 2017-04-27 DIAGNOSIS — J45909 Unspecified asthma, uncomplicated: Secondary | ICD-10-CM | POA: Diagnosis not present

## 2017-04-27 DIAGNOSIS — Z880 Allergy status to penicillin: Secondary | ICD-10-CM | POA: Diagnosis not present

## 2017-04-27 DIAGNOSIS — Z9013 Acquired absence of bilateral breasts and nipples: Secondary | ICD-10-CM | POA: Diagnosis not present

## 2017-04-27 DIAGNOSIS — Z91018 Allergy to other foods: Secondary | ICD-10-CM | POA: Diagnosis not present

## 2017-04-27 DIAGNOSIS — L923 Foreign body granuloma of the skin and subcutaneous tissue: Secondary | ICD-10-CM | POA: Diagnosis not present

## 2017-04-27 DIAGNOSIS — T8549XA Other mechanical complication of breast prosthesis and implant, initial encounter: Secondary | ICD-10-CM | POA: Diagnosis not present

## 2017-04-27 DIAGNOSIS — Z885 Allergy status to narcotic agent status: Secondary | ICD-10-CM | POA: Diagnosis not present

## 2017-04-27 DIAGNOSIS — Z853 Personal history of malignant neoplasm of breast: Secondary | ICD-10-CM | POA: Diagnosis not present

## 2017-04-27 DIAGNOSIS — Z91040 Latex allergy status: Secondary | ICD-10-CM | POA: Diagnosis not present

## 2017-04-27 DIAGNOSIS — Z882 Allergy status to sulfonamides status: Secondary | ICD-10-CM | POA: Diagnosis not present

## 2017-04-27 DIAGNOSIS — Z881 Allergy status to other antibiotic agents status: Secondary | ICD-10-CM | POA: Diagnosis not present

## 2017-05-05 DIAGNOSIS — Z9013 Acquired absence of bilateral breasts and nipples: Secondary | ICD-10-CM | POA: Diagnosis not present

## 2017-05-12 DIAGNOSIS — Z9013 Acquired absence of bilateral breasts and nipples: Secondary | ICD-10-CM | POA: Diagnosis not present

## 2017-05-21 DIAGNOSIS — L0390 Cellulitis, unspecified: Secondary | ICD-10-CM | POA: Diagnosis not present

## 2017-05-21 DIAGNOSIS — N6489 Other specified disorders of breast: Secondary | ICD-10-CM | POA: Diagnosis not present

## 2017-05-21 DIAGNOSIS — Z9013 Acquired absence of bilateral breasts and nipples: Secondary | ICD-10-CM | POA: Diagnosis not present

## 2017-05-24 ENCOUNTER — Other Ambulatory Visit: Payer: Self-pay | Admitting: Plastic Surgery

## 2017-05-24 DIAGNOSIS — N6489 Other specified disorders of breast: Secondary | ICD-10-CM

## 2017-05-25 ENCOUNTER — Ambulatory Visit
Admission: RE | Admit: 2017-05-25 | Discharge: 2017-05-25 | Disposition: A | Discharge status: Home / Self Care | Payer: Medicare Other | Source: Ambulatory Visit | Attending: Plastic Surgery | Admitting: Plastic Surgery

## 2017-05-25 ENCOUNTER — Other Ambulatory Visit: Payer: Self-pay | Admitting: Plastic Surgery

## 2017-05-25 DIAGNOSIS — N6489 Other specified disorders of breast: Secondary | ICD-10-CM

## 2017-05-25 DIAGNOSIS — N611 Abscess of the breast and nipple: Secondary | ICD-10-CM | POA: Diagnosis not present

## 2017-05-25 DIAGNOSIS — R928 Other abnormal and inconclusive findings on diagnostic imaging of breast: Secondary | ICD-10-CM | POA: Diagnosis not present

## 2017-05-26 DIAGNOSIS — L0390 Cellulitis, unspecified: Secondary | ICD-10-CM | POA: Diagnosis not present

## 2017-05-26 DIAGNOSIS — Z9013 Acquired absence of bilateral breasts and nipples: Secondary | ICD-10-CM | POA: Diagnosis not present

## 2017-05-30 LAB — AEROBIC/ANAEROBIC CULTURE (SURGICAL/DEEP WOUND)

## 2017-05-30 LAB — AEROBIC/ANAEROBIC CULTURE W GRAM STAIN (SURGICAL/DEEP WOUND)

## 2017-06-09 DIAGNOSIS — T8549XA Other mechanical complication of breast prosthesis and implant, initial encounter: Secondary | ICD-10-CM | POA: Diagnosis not present

## 2017-06-10 ENCOUNTER — Other Ambulatory Visit: Payer: Self-pay | Admitting: Internal Medicine

## 2017-06-10 ENCOUNTER — Encounter: Payer: Self-pay | Admitting: Internal Medicine

## 2017-06-10 ENCOUNTER — Telehealth: Payer: Self-pay

## 2017-06-10 ENCOUNTER — Other Ambulatory Visit (INDEPENDENT_AMBULATORY_CARE_PROVIDER_SITE_OTHER): Payer: Medicare Other

## 2017-06-10 ENCOUNTER — Ambulatory Visit (INDEPENDENT_AMBULATORY_CARE_PROVIDER_SITE_OTHER): Payer: Medicare Other | Admitting: Internal Medicine

## 2017-06-10 VITALS — BP 126/78 | HR 105 | Temp 98.5°F | Ht 60.0 in | Wt 140.0 lb

## 2017-06-10 DIAGNOSIS — Z0000 Encounter for general adult medical examination without abnormal findings: Secondary | ICD-10-CM

## 2017-06-10 DIAGNOSIS — D75839 Thrombocytosis, unspecified: Secondary | ICD-10-CM

## 2017-06-10 DIAGNOSIS — D473 Essential (hemorrhagic) thrombocythemia: Secondary | ICD-10-CM

## 2017-06-10 DIAGNOSIS — D649 Anemia, unspecified: Secondary | ICD-10-CM

## 2017-06-10 LAB — CBC WITH DIFFERENTIAL/PLATELET
BASOS ABS: 0.1 10*3/uL (ref 0.0–0.1)
BASOS PCT: 2.3 % (ref 0.0–3.0)
EOS ABS: 0.2 10*3/uL (ref 0.0–0.7)
Eosinophils Relative: 3.4 % (ref 0.0–5.0)
HCT: 33.9 % — ABNORMAL LOW (ref 36.0–46.0)
HEMOGLOBIN: 10.6 g/dL — AB (ref 12.0–15.0)
LYMPHS PCT: 48.8 % — AB (ref 12.0–46.0)
Lymphs Abs: 3 10*3/uL (ref 0.7–4.0)
MCHC: 31.1 g/dL (ref 30.0–36.0)
MCV: 81.4 fl (ref 78.0–100.0)
MONO ABS: 0.6 10*3/uL (ref 0.1–1.0)
Monocytes Relative: 9 % (ref 3.0–12.0)
Neutro Abs: 2.3 10*3/uL (ref 1.4–7.7)
Neutrophils Relative %: 36.5 % — ABNORMAL LOW (ref 43.0–77.0)
Platelets: 740 10*3/uL — ABNORMAL HIGH (ref 150.0–400.0)
RBC: 4.16 Mil/uL (ref 3.87–5.11)
RDW: 17.3 % — AB (ref 11.5–15.5)
WBC: 6.2 10*3/uL (ref 4.0–10.5)

## 2017-06-10 LAB — HEPATIC FUNCTION PANEL
ALBUMIN: 4.1 g/dL (ref 3.5–5.2)
ALT: 9 U/L (ref 0–35)
AST: 18 U/L (ref 0–37)
Alkaline Phosphatase: 67 U/L (ref 39–117)
BILIRUBIN TOTAL: 0.3 mg/dL (ref 0.2–1.2)
Bilirubin, Direct: 0.1 mg/dL (ref 0.0–0.3)
TOTAL PROTEIN: 7.2 g/dL (ref 6.0–8.3)

## 2017-06-10 LAB — LIPID PANEL
CHOL/HDL RATIO: 3
CHOLESTEROL: 178 mg/dL (ref 0–200)
HDL: 55.3 mg/dL (ref >39.00)
NonHDL: 123.02
TRIGLYCERIDES: 238 mg/dL — AB (ref 0.0–149.0)
VLDL: 47.6 mg/dL — ABNORMAL HIGH (ref 0.0–40.0)

## 2017-06-10 LAB — BASIC METABOLIC PANEL
BUN: 16 mg/dL (ref 6–23)
CHLORIDE: 104 meq/L (ref 96–112)
CO2: 26 meq/L (ref 19–32)
CREATININE: 1.11 mg/dL (ref 0.40–1.20)
Calcium: 10 mg/dL (ref 8.4–10.5)
GFR: 51.87 mL/min — ABNORMAL LOW (ref >60.00)
GLUCOSE: 94 mg/dL (ref 70–99)
POTASSIUM: 4 meq/L (ref 3.5–5.1)
Sodium: 140 mEq/L (ref 135–145)

## 2017-06-10 LAB — TSH: TSH: 2.44 u[IU]/mL (ref 0.35–4.50)

## 2017-06-10 LAB — LDL CHOLESTEROL, DIRECT: LDL DIRECT: 49 mg/dL

## 2017-06-10 NOTE — Patient Instructions (Signed)
Please continue all other medications as before, and refills have been done if requested.  Please have the pharmacy call with any other refills you may need.  Please continue your efforts at being more active, low cholesterol diet, and weight control.  You are otherwise up to date with prevention measures today.  Please keep your appointments with your specialists as you may have planned  Please go to the LAB in the Basement (turn left off the elevator) for the tests to be done today  You will be contacted by phone if any changes need to be made immediately.  Otherwise, you will receive a letter about your results with an explanation, but please check with MyChart first.  Please remember to sign up for MyChart if you have not done so, as this will be important to you in the future with finding out test results, communicating by private email, and scheduling acute appointments online when needed.  Please return in 1 year for your yearly visit, or sooner if needed, with Lab testing done 3-5 days before  Please return for LAB ONLY monthly for at least 3 months to check the platelets

## 2017-06-10 NOTE — Telephone Encounter (Signed)
Called pt, LVM.   

## 2017-06-10 NOTE — Telephone Encounter (Signed)
Pt informed of MD response and will come back at her convenience for recheck on lab work.

## 2017-06-10 NOTE — Progress Notes (Signed)
Subjective:    Patient ID: [name] [name], female    DOB: [birth year], 66 y.o.   MRN: [id number]  HPI  Here to f/u; overall doing ok,  Pt denies chest pain, increasing sob or doe, wheezing, orthopnea, PND, increased LE swelling, palpitations, dizziness or syncope.  Pt denies new neurological symptoms such as new headache, or facial or extremity weakness or numbness.  Pt denies polydipsia, polyuria, or low sugar episode.  Pt states overall good compliance with meds.  S/p bilat breast implant removed aug [id number] complicated by seroma and cellulitis (Dr Mercy Moore [facility]); also indicently found plateltes more then 1 million with sob, fatigue and dizziness.  Now on 325 mg daily, wants to avoid hematology for now, asks for recheck here.  Wound check done yesterday, persistent small wound is improving.  Declines for now.  Past Medical History:  Diagnosis Date  . ASTHMA   . BREAST CANCER, HX OF 11/1979  . GERD   . Irritable bowel syndrome   . MIGRAINE HEADACHE    Past Surgical History:  Procedure Laterality Date  . ABDOMINAL HYSTERECTOMY  11/1979  . Left wrist  1963  . MASTECTOMY  11/1979   Bilaterally & implants  . TONSILLECTOMY  1955  . TUBAL LIGATION  1975    reports that she has never smoked. She has never used smokeless tobacco. She reports that she drinks alcohol. She reports that she does not use drugs. family history includes Breast cancer in her mother; Colon polyps (age of onset: 84) in her sister; Coronary artery disease in her mother; Coronary artery disease (age of onset: 80) in her father; Endometriosis in her sister; Hyperlipidemia in her brother; Hypertension in her brother; Irritable bowel syndrome in her sister; Uterine cancer in her mother. Allergies  Allergen Reactions  . Albuterol Sulfate Anaphylaxis    Pt states she has tolerated levalbuterol  . Amoxicillin-Pot Clavulanate     REACTION: ANAPHYLACTIC REACTION  Lips swell   . Epipen [Epinephrine] Other (See Comments)   Allergic to preservatives in Epipen with caution!!!  . Fluzone     REACTION: ANAPHYLACTIC  . Influenza Vaccines Anaphylaxis  . Morphine     REACTION: ANAPHYLACTIC REACTION  . Povidone-Iodine     REACTION: HIVES/ANAPHYLACTIC WITH INJECTABLE DYE  . Sulfonamide Derivatives     REACTION: ANAPHYLACTIC REACTION  . Atrovent [Ipratropium] Other (See Comments)    Has sulfa in preservative  . Ciprofloxacin     Itchy palms  . Hyoscyamine Sulfate Hives  . Meperidine Hcl     REACTION: N\T\V  . Other     Mucomist-unable to breathe  . Pepcid [Famotidine] Other (See Comments)  . Pneumococcal Vaccines     Pt declines  . Ppd [Tuberculin Purified Protein Derivative]     Arm swelling with red streaks  . Reglan [Metoclopramide] Other (See Comments)  . Zostavax [Zoster Vaccine Live]     Pt declines   Current Outpatient Prescriptions on File Prior to Visit  Medication Sig Dispense Refill  . Ascorbic Acid (VITAMIN C) 1000 MG tablet Take 1,000 mg by mouth daily.    Marland Kitchen aspirin-acetaminophen-caffeine (EXCEDRIN MIGRAINE) 250-250-65 MG per tablet Take 1 tablet by mouth every 6 (six) hours as needed. For migraine    . Calcium Carbonate-Vit D-Min 600-200 MG-UNIT TABS Take 1 tablet by mouth 2 (two) times daily.    . Cyanocobalamin (VITAMIN B 12 PO) Take 1 tablet by mouth daily.    . cyclobenzaprine (FLEXERIL) 10 MG tablet Take 1 tablet (  10 mg total) by mouth 3 (three) times daily as needed for muscle spasms. Yearly physical due in March must see MD for refills 60 tablet 0  . diphenhydrAMINE (BENADRYL) 25 MG tablet Take 25 mg by mouth every 8 (eight) hours as needed. For allergies    . levalbuterol (XOPENEX HFA) 45 MCG/ACT inhaler Inhale 1-2 puffs into the lungs every 6 (six) hours as needed. For shortness of breath 1 Inhaler prn  . levalbuterol (XOPENEX) 1.25 MG/3ML nebulizer solution Take 1.25 mg by nebulization every 4 (four) hours as needed for wheezing. 72 mL 12  . Melatonin 10 MG CAPS Take 10 mg by mouth  at bedtime.    . mometasone-formoterol (DULERA) 200-5 MCG/ACT AERO Inhale 2 puffs into the lungs 2 (two) times daily. Rinse mouth 1 Inhaler 2  . Multiple Vitamin (MULTIVITAMIN) tablet Take 1 tablet by mouth daily.      . promethazine (PHENERGAN) 25 MG tablet Take 1 tablet (25 mg total) by mouth every 6 (six) hours as needed for nausea. 30 tablet 0  . promethazine-codeine (PHENERGAN WITH CODEINE) 6.25-10 MG/5ML syrup Take 5 mLs by mouth every 6 (six) hours as needed for cough. 200 mL 0  . vitamin E 400 UNIT capsule Take 400 Units by mouth daily.      Marland Kitchen zolpidem (AMBIEN) 10 MG tablet TAKE 1 TABLET BY MOUTH AT BEDTIME 90 tablet 1  . EPINEPHrine (EPIPEN 2-PAK) 0.3 mg/0.3 mL IJ SOAJ injection Inject 0.3 mLs (0.3 mg total) into the muscle once. 2 Device 12   No current facility-administered medications on file prior to visit.    Review of Systems All other system neg per pt    Objective:   Physical Exam BP 126/78   Pulse (!) 105   Temp 98.5 F (36.9 C) (Oral)   Ht 5' (1.524 m)   Wt 140 lb (63.5 kg)   SpO2 99%   BMI 27.34 kg/m  VS noted,  Constitutional: Pt is oriented to person, place, and time. Appears well-developed and well-nourished, in no significant distress and comfortable Head: Normocephalic and atraumatic  Eyes: Conjunctivae and EOM are normal. Pupils are equal, round, and reactive to light Right Ear: External ear normal without discharge Left Ear: External ear normal without discharge Nose: Nose without discharge or deformity Mouth/Throat: Oropharynx is without other ulcerations and moist  Neck: Normal range of motion. Neck supple. No JVD present. No tracheal deviation present or significant neck LA or mass Cardiovascular: Normal rate, regular rhythm, normal heart sounds and intact distal pulses.   Pulmonary/Chest: WOB normal and breath sounds without rales or wheezing  Abdominal: Soft. Bowel sounds are normal. NT. No HSM  Musculoskeletal: Normal range of motion. Exhibits no  edema Lymphadenopathy: Has no other cervical adenopathy.  Neurological: Pt is alert and oriented to person, place, and time. Pt has normal reflexes. No cranial nerve deficit. Motor grossly intact, Gait intact Skin: Skin is warm and dry. No rash noted or new ulcerations Psychiatric:  Has normal mood and affect. Behavior is normal without agitation No other exam findings    Assessment & Plan:

## 2017-06-10 NOTE — Telephone Encounter (Signed)
-----   Message from Biagio Borg, MD sent at 06/10/2017  1:49 PM EDT ----- Left message on MyChart, pt to cont same tx except  The test results show that your current treatment is OK, except there is new mild worsening anemia.  Please come to the LAB only for follow up testing for Iron and B12 levels.  You should also hear from the office about this.  M[place]au [place] to please inform pt, I will do orders

## 2017-06-11 ENCOUNTER — Other Ambulatory Visit (INDEPENDENT_AMBULATORY_CARE_PROVIDER_SITE_OTHER): Payer: Medicare Other

## 2017-06-11 ENCOUNTER — Telehealth: Payer: Self-pay

## 2017-06-11 DIAGNOSIS — D75839 Thrombocytosis, unspecified: Secondary | ICD-10-CM

## 2017-06-11 DIAGNOSIS — D473 Essential (hemorrhagic) thrombocythemia: Secondary | ICD-10-CM | POA: Diagnosis not present

## 2017-06-11 DIAGNOSIS — D649 Anemia, unspecified: Secondary | ICD-10-CM

## 2017-06-11 LAB — CBC WITH DIFFERENTIAL/PLATELET
Basophils Absolute: 0.1 10*3/uL (ref 0.0–0.1)
Basophils Relative: 2.1 % (ref 0.0–3.0)
EOS ABS: 0.3 10*3/uL (ref 0.0–0.7)
Eosinophils Relative: 5 % (ref 0.0–5.0)
HEMATOCRIT: 32.9 % — AB (ref 36.0–46.0)
HEMOGLOBIN: 10.4 g/dL — AB (ref 12.0–15.0)
LYMPHS PCT: 52.8 % — AB (ref 12.0–46.0)
Lymphs Abs: 2.9 10*3/uL (ref 0.7–4.0)
MCHC: 31.5 g/dL (ref 30.0–36.0)
MCV: 81.2 fl (ref 78.0–100.0)
MONO ABS: 0.4 10*3/uL (ref 0.1–1.0)
Monocytes Relative: 7.9 % (ref 3.0–12.0)
Neutro Abs: 1.8 10*3/uL (ref 1.4–7.7)
Neutrophils Relative %: 32.2 % — ABNORMAL LOW (ref 43.0–77.0)
RBC: 4.06 Mil/uL (ref 3.87–5.11)
RDW: 16.9 % — ABNORMAL HIGH (ref 11.5–15.5)
WBC: 5.6 10*3/uL (ref 4.0–10.5)

## 2017-06-11 LAB — IBC PANEL
IRON: 38 ug/dL — AB (ref 42–145)
Saturation Ratios: 8.3 % — ABNORMAL LOW (ref 20.0–50.0)
TRANSFERRIN: 329 mg/dL (ref 212.0–360.0)

## 2017-06-11 LAB — VITAMIN B12: Vitamin B-12: >1500 pg/mL — ABNORMAL HIGH (ref 211–911)

## 2017-06-11 LAB — FERRITIN: FERRITIN: 33.7 ng/mL (ref 10.0–291.0)

## 2017-06-11 NOTE — Telephone Encounter (Signed)
-----   Message from Biagio Borg, MD sent at 06/11/2017  1:13 PM EDT ----- Left message on MyChart, pt to cont same tx except  The test results show that your current treatment is OK, except there is new iron deficiency anemia (which could be related to recent surgury, but could also be Gastrointestinal blood loss related), and platelets are improved over the over 1 million you mentioned at your visit.  Marland Kitchen  Please take OTC iron sulfate 325 mg twice per day for 1 month.  Please remember to have your CBC done monthly as we discussed for at least 3 months.  Please let me know if you have changed your mind about referral to Hematology, as well as Gastroenterology (which is often done for evaluation for GI blood loss).  I will ask the office to call as well  Shirron to please inform pt, and let me know if she would want referral to hematology or also gastroenterology

## 2017-06-11 NOTE — Telephone Encounter (Signed)
Pt has been notified and expressed understanding. She stated that she has not changed her mind about the referrals.

## 2017-06-12 NOTE — Assessment & Plan Note (Deleted)

## 2017-06-12 NOTE — Assessment & Plan Note (Signed)
For f/u plt today and monthly to monitor for any worsening, pt declines hematology referral but would like to see Dr Jana Hakim or Dr Ammie Dalton if needed

## 2017-07-12 ENCOUNTER — Telehealth: Payer: Self-pay

## 2017-07-12 ENCOUNTER — Other Ambulatory Visit (INDEPENDENT_AMBULATORY_CARE_PROVIDER_SITE_OTHER): Payer: Medicare Other

## 2017-07-12 DIAGNOSIS — D691 Qualitative platelet defects: Secondary | ICD-10-CM

## 2017-07-12 LAB — CBC WITH DIFFERENTIAL/PLATELET
BASOS ABS: 0.1 10*3/uL (ref 0.0–0.1)
Basophils Relative: 1.3 % (ref 0.0–3.0)
EOS ABS: 0.2 10*3/uL (ref 0.0–0.7)
Eosinophils Relative: 3.2 % (ref 0.0–5.0)
HEMATOCRIT: 36.9 % (ref 36.0–46.0)
HEMOGLOBIN: 11.9 g/dL — AB (ref 12.0–15.0)
LYMPHS PCT: 40.8 % (ref 12.0–46.0)
Lymphs Abs: 2.6 10*3/uL (ref 0.7–4.0)
MCHC: 32.2 g/dL (ref 30.0–36.0)
MCV: 81.6 fl (ref 78.0–100.0)
Monocytes Absolute: 0.4 10*3/uL (ref 0.1–1.0)
Monocytes Relative: 6.6 % (ref 3.0–12.0)
NEUTROS ABS: 3.1 10*3/uL (ref 1.4–7.7)
Neutrophils Relative %: 48.1 % (ref 43.0–77.0)
PLATELETS: 564 10*3/uL — AB (ref 150.0–400.0)
RBC: 4.52 Mil/uL (ref 3.87–5.11)
RDW: 19 % — ABNORMAL HIGH (ref 11.5–15.5)
WBC: 6.4 10*3/uL (ref 4.0–10.5)

## 2017-07-12 NOTE — Telephone Encounter (Signed)
CBC needed to be ordered to check platelets per telephone note instructions from [facility]

## 2017-08-13 DIAGNOSIS — H2513 Age-related nuclear cataract, bilateral: Secondary | ICD-10-CM | POA: Diagnosis not present

## 2017-08-26 ENCOUNTER — Other Ambulatory Visit: Payer: Self-pay | Admitting: Internal Medicine

## 2017-08-26 DIAGNOSIS — R5383 Other fatigue: Secondary | ICD-10-CM

## 2017-09-13 ENCOUNTER — Ambulatory Visit: Payer: Medicare Other | Admitting: Internal Medicine

## 2017-09-13 ENCOUNTER — Encounter: Payer: Self-pay | Admitting: Internal Medicine

## 2017-09-13 DIAGNOSIS — J45909 Unspecified asthma, uncomplicated: Secondary | ICD-10-CM | POA: Diagnosis not present

## 2017-09-13 MED ORDER — EPINEPHRINE 0.3 MG/0.3ML IJ SOAJ: 0.3000 mg | Freq: Once | INTRAMUSCULAR | 12 refills | Status: DC

## 2017-09-13 MED ORDER — LEVALBUTEROL TARTRATE 45 MCG/ACT IN AERO: 1.0000 | INHALATION_SPRAY | Freq: Four times a day (QID) | RESPIRATORY_TRACT | 99 refills | Status: DC | PRN

## 2017-09-13 NOTE — Patient Instructions (Signed)
Refill scripts printed  Please call if we can help

## 2017-09-13 NOTE — Progress Notes (Signed)
Patient ID: [name] [name], female    DOB: 12-11-1950, 66 y.o.   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 Uses Dulera 100, Xopenex by nebulizer and HFA. Has EpiPen. She carefully avoids sulfite preservatives in medications and other products, convinced this is a trigger for her. We don't have clear documentation to distinguish between true anaphylaxis and anxiety/panic associated with early symptoms. Needs refill of her routine meds. Says she has to stay with [facility] because of the preservative issue and anticipates we will need to do a prior authorization to continue this. Now 2 days ago started progressive nasal congestion, head congestion, some chest tightness without fever or purulent sputum.  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  She had ruptured breast implants removed this year and hopes that will mean less allergies/hypersensitivity like problems for her.  Has found that she can tolerate Levaquin. Very little asthma in the past year.  Using her Xopenex rescue inhaler less than once a week and rarely needing Dulera, no sleep disturbance.  Takes her nebulizer machine on trips but has not needed to use it much.  ROS-see HPI  + = positive Constitutional:   No-   weight loss, night sweats, fevers, chills, fatigue, lassitude. HEENT:   No-  headaches, difficulty swallowing, tooth/dental problems, sore throat,       No-   sneezing, itching, ear ache, +nasal congestion, +post nasal drip,  CV:  No-   chest pain, orthopnea, PND, swelling in lower extremities, anasarca,  dizziness, palpitations Resp: No-   shortness of breath with exertion or at rest.              No-   productive cough, + non-productive cough,  No- coughing up of blood.              No-   change in color of mucus.  + wheezing.   Skin: No-   rash or lesions. GI:  No-   heartburn, indigestion, abdominal pain, nausea, vomiting,  GU:  MS:  No-   joint pain or swelling. Neuro-     nothing unusual Psych:  No- change in mood or affect. No depression or anxiety.  No memory loss.   Objective:   Physical Exam General- Alert, Oriented, Affect-appropriate, Distress- none acute Skin- rash-none, lesions- none, excoriation- none Lymphadenopathy- none Head- atraumatic            Eyes- Gross vision intact, PERRLA, conjunctivae clear secretions            Ears- Hearing, canals            Nose-  No-Septal dev, mucus- clear, no-polyps, erosion, perforation             Throat- Mallampati II , mucosa red , drainage- none, tonsils- atrophic , + active gag.  Neck- flexible , trachea midline, no stridor , thyroid nl, carotid no bruit Chest - symmetrical excursion , unlabored           Heart/CV- RRR , no murmur , no gallop  , no rub, nl s1 s2                           -  JVD- none , edema- none, stasis changes- none, varices- none           Lung- clear to P&A, wheeze- none, cough- none , dullness-none, rub- none           Chest wall-  Abd-  Br/ Gen/ Rectal- Not done, not indicated Extrem- cyanosis- none, clubbing, none, atrophy- none, strength- nl Neuro- grossly intact to observation

## 2017-09-13 NOTE — Assessment & Plan Note (Signed)
Current pattern best fits mild intermittent uncomplicated.  She is not routinely needing her maintenance controller.  Medications were reviewed. She has given a history of intolerance and adverse reaction to a number of medications over the years.  We continue to avoid albuterol on that basis. Plan-refill rescue inhaler and EpiPen

## 2018-01-24 ENCOUNTER — Other Ambulatory Visit: Payer: Self-pay | Admitting: Internal Medicine

## 2018-01-24 DIAGNOSIS — R5383 Other fatigue: Secondary | ICD-10-CM

## 2018-01-24 NOTE — Telephone Encounter (Signed)
Done erx 

## 2018-01-24 NOTE — Telephone Encounter (Signed)
02/12/2017 90# 

## 2018-02-04 ENCOUNTER — Encounter: Payer: Self-pay | Admitting: Internal Medicine

## 2018-02-04 ENCOUNTER — Ambulatory Visit (INDEPENDENT_AMBULATORY_CARE_PROVIDER_SITE_OTHER): Payer: Medicare Other | Admitting: Internal Medicine

## 2018-02-04 VITALS — BP 130/90 | HR 93 | Ht 63.0 in | Wt 142.0 lb

## 2018-02-04 DIAGNOSIS — Z0000 Encounter for general adult medical examination without abnormal findings: Secondary | ICD-10-CM

## 2018-02-04 DIAGNOSIS — E538 Deficiency of other specified B group vitamins: Secondary | ICD-10-CM | POA: Diagnosis not present

## 2018-02-04 DIAGNOSIS — D509 Iron deficiency anemia, unspecified: Secondary | ICD-10-CM | POA: Diagnosis not present

## 2018-02-04 DIAGNOSIS — E559 Vitamin D deficiency, unspecified: Secondary | ICD-10-CM

## 2018-02-04 NOTE — Progress Notes (Signed)
Subjective:    Patient ID: [name] [name], female    DOB: 06-Nov-1950, 67 y.o.   MRN: [id number]  HPI  Here for wellness and f/u;  Overall doing ok;  Pt denies Chest pain, worsening SOB, DOE, wheezing, orthopnea, PND, worsening LE edema, palpitations, dizziness or syncope.  Pt denies neurological change such as new headache, facial or extremity weakness.  Pt denies polydipsia, polyuria, or low sugar symptoms. Pt states overall good compliance with treatment and medications, good tolerability, and has been trying to follow appropriate diet.  Pt denies worsening depressive symptoms, suicidal ideation or panic. No fever, night sweats, wt loss, loss of appetite, or other constitutional symptoms.  Pt states good ability with ADL's, has low fall risk, home safety reviewed and adequate, no other significant changes in hearing or vision, and only occasionally active with exercise.  Declines all immunizations due to allergy/intolerance.  Declines colon screening  No other new complaint or interval hx Past Medical History:  Diagnosis Date  . ASTHMA   . BREAST CANCER, HX OF 11/1979  . GERD   . Irritable bowel syndrome   . MIGRAINE HEADACHE    Past Surgical History:  Procedure Laterality Date  . ABDOMINAL HYSTERECTOMY  11/1979  . Left wrist  1963  . MASTECTOMY  11/1979   Bilaterally & implants  . TONSILLECTOMY  1955  . TUBAL LIGATION  1975    reports that she has never smoked. She has never used smokeless tobacco. She reports that she drinks alcohol. She reports that she does not use drugs. family history includes Breast cancer in her mother; Colon polyps (age of onset: 54) in her sister; Coronary artery disease in her mother; Coronary artery disease (age of onset: 44) in her father; Endometriosis in her sister; Hyperlipidemia in her brother; Hypertension in her brother; Irritable bowel syndrome in her sister; Uterine cancer in her mother. Allergies  Allergen Reactions  . Albuterol Sulfate Anaphylaxis    Pt states she has tolerated levalbuterol  . Amoxicillin-Pot Clavulanate     REACTION: ANAPHYLACTIC REACTION  Lips swell   . Epipen [Epinephrine] Other (See Comments)    Allergic to preservatives in Epipen with caution!!!  . Fluzone     REACTION: ANAPHYLACTIC  . Influenza Vaccines Anaphylaxis  . Morphine     REACTION: ANAPHYLACTIC REACTION  . Povidone-Iodine     REACTION: HIVES/ANAPHYLACTIC WITH INJECTABLE DYE  . Sulfonamide Derivatives     REACTION: ANAPHYLACTIC REACTION  . Atrovent [Ipratropium] Other (See Comments)    Has sulfa in preservative  . Ciprofloxacin     Itchy palms  . Hyoscyamine Sulfate Hives  . Meperidine Hcl     REACTION: N\T\V  . Other     Mucomist-unable to breathe  . Pepcid [Famotidine] Other (See Comments)  . Pneumococcal Vaccines     Pt declines  . Ppd [Tuberculin Purified Protein Derivative]     Arm swelling with red streaks  . Reglan [Metoclopramide] Other (See Comments)  . Zostavax [Zoster Vaccine Live]     Pt declines   Current Outpatient Medications on File Prior to Visit  Medication Sig Dispense Refill  . Ascorbic Acid (VITAMIN C) 1000 MG tablet Take 1,000 mg by mouth daily.    Marland Kitchen aspirin-acetaminophen-caffeine (EXCEDRIN MIGRAINE) 250-250-65 MG per tablet Take 1 tablet by mouth every 6 (six) hours as needed. For migraine    . Calcium Carbonate-Vit D-Min 600-200 MG-UNIT TABS Take 1 tablet by mouth 2 (two) times daily.    . Cyanocobalamin (VITAMIN  B 12 PO) Take 1 tablet by mouth daily.    . cyclobenzaprine (FLEXERIL) 10 MG tablet TAKE 1 TABLET BY MOUTH 3 TIMES DAILY FOR MUSCLE SPASMS **Must see MD for refills** 60 tablet 0  . diphenhydrAMINE (BENADRYL) 25 MG tablet Take 25 mg by mouth every 8 (eight) hours as needed. For allergies    . levalbuterol (XOPENEX HFA) 45 MCG/ACT inhaler Inhale 1-2 puffs into the lungs every 6 (six) hours as needed. For shortness of breath 1 Inhaler prn  . levalbuterol (XOPENEX) 1.25 MG/3ML nebulizer solution Take 1.25 mg  by nebulization every 4 (four) hours as needed for wheezing. 72 mL 12  . Melatonin 10 MG CAPS Take 10 mg by mouth at bedtime.    . mometasone-formoterol (DULERA) 200-5 MCG/ACT AERO Inhale 2 puffs into the lungs 2 (two) times daily. Rinse mouth 1 Inhaler 2  . Multiple Vitamin (MULTIVITAMIN) tablet Take 1 tablet by mouth daily.      . vitamin E 400 UNIT capsule Take 400 Units by mouth daily.      Marland Kitchen zolpidem (AMBIEN) 10 MG tablet TAKE 1 TABLET BY MOUTH AT BEDTIME 90 tablet 1  . EPINEPHrine (EPIPEN 2-PAK) 0.3 mg/0.3 mL IJ SOAJ injection Inject 0.3 mLs (0.3 mg total) into the muscle once for 1 dose. 2 Device 12   No current facility-administered medications on file prior to visit.    Review of Systems Constitutional: Negative for other unusual diaphoresis, sweats, appetite or weight changes HENT: Negative for other worsening hearing loss, ear pain, facial swelling, mouth sores or neck stiffness.   Eyes: Negative for other worsening pain, redness or other visual disturbance.  Respiratory: Negative for other stridor or swelling Cardiovascular: Negative for other palpitations or other chest pain  Gastrointestinal: Negative for worsening diarrhea or loose stools, blood in stool, distention or other pain Genitourinary: Negative for hematuria, flank pain or other change in urine volume.  Musculoskeletal: Negative for myalgias or other joint swelling.  Skin: Negative for other color change, or other wound or worsening drainage.  Neurological: Negative for other syncope or numbness. Hematological: Negative for other adenopathy or swelling Psychiatric/Behavioral: Negative for hallucinations, other worsening agitation, SI, self-injury, or new decreased concentration All other system neg per pt    Objective:   Physical Exam BP 130/90 (BP Location: Left Arm, Patient Position: Sitting, Cuff Size: Normal)   Pulse 93   Ht 5\' 3"  (1.6 m)   Wt 142 lb (64.4 kg)   SpO2 96%   BMI 25.15 kg/m  VS noted,    Constitutional: Pt is oriented to person, place, and time. Appears well-developed and well-nourished, in no significant distress and comfortable Head: Normocephalic and atraumatic  Eyes: Conjunctivae and EOM are normal. Pupils are equal, round, and reactive to light Right Ear: External ear normal without discharge Left Ear: External ear normal without discharge Nose: Nose without discharge or deformity Mouth/Throat: Oropharynx is without other ulcerations and moist  Neck: Normal range of motion. Neck supple. No JVD present. No tracheal deviation present or significant neck LA or mass Cardiovascular: Normal rate, regular rhythm, normal heart sounds and intact distal pulses.   Pulmonary/Chest: WOB normal and breath sounds without rales or wheezing  Abdominal: Soft. Bowel sounds are normal. NT. No HSM  Musculoskeletal: Normal range of motion. Exhibits no edema Lymphadenopathy: Has no other cervical adenopathy.  Neurological: Pt is alert and oriented to person, place, and time. Pt has normal reflexes. No cranial nerve deficit. Motor grossly intact, Gait intact Skin: Skin is  warm and dry. No rash noted or new ulcerations Psychiatric:  Has normal mood and affect. Behavior is normal without agitation No other exam findings    Assessment & Plan:

## 2018-02-04 NOTE — Patient Instructions (Signed)
Please continue all other medications as before, and refills have been done if requested.  Please have the pharmacy call with any other refills you may need.  Please continue your efforts at being more active, low cholesterol diet, and weight control.  You are otherwise up to date with prevention measures today.  Please keep your appointments with your specialists as you may have planned  Please call if you change your mind about shots and seeing Dr Danis/GI  Please go to the LAB in the Basement (turn left off the elevator) for the tests to be done at your convenience  You will be contacted by phone if any changes need to be made immediately.  Otherwise, you will receive a letter about your results with an explanation, but please check with MyChart first.  Please remember to sign up for MyChart if you have not done so, as this will be important to you in the future with finding out test results, communicating by private email, and scheduling acute appointments online when needed.  Please return in 1 year for your yearly visit, or sooner if needed, with Lab testing done 3-5 days before

## 2018-02-05 NOTE — Assessment & Plan Note (Signed)

## 2018-02-05 NOTE — Assessment & Plan Note (Signed)
For f/u iron panel, also refuses GI referral

## 2018-02-08 ENCOUNTER — Other Ambulatory Visit (INDEPENDENT_AMBULATORY_CARE_PROVIDER_SITE_OTHER): Payer: Medicare Other

## 2018-02-08 DIAGNOSIS — Z0000 Encounter for general adult medical examination without abnormal findings: Secondary | ICD-10-CM

## 2018-02-08 DIAGNOSIS — D509 Iron deficiency anemia, unspecified: Secondary | ICD-10-CM

## 2018-02-08 LAB — CBC WITH DIFFERENTIAL/PLATELET
BASOS ABS: 0.1 10*3/uL (ref 0.0–0.1)
Basophils Relative: 1.9 % (ref 0.0–3.0)
EOS ABS: 0.2 10*3/uL (ref 0.0–0.7)
Eosinophils Relative: 3.6 % (ref 0.0–5.0)
HEMATOCRIT: 35.8 % — AB (ref 36.0–46.0)
HEMOGLOBIN: 11.7 g/dL — AB (ref 12.0–15.0)
LYMPHS PCT: 39.4 % (ref 12.0–46.0)
Lymphs Abs: 2.4 10*3/uL (ref 0.7–4.0)
MCHC: 32.6 g/dL (ref 30.0–36.0)
MCV: 82.8 fl (ref 78.0–100.0)
MONO ABS: 0.4 10*3/uL (ref 0.1–1.0)
Monocytes Relative: 6.5 % (ref 3.0–12.0)
Neutro Abs: 3 10*3/uL (ref 1.4–7.7)
Neutrophils Relative %: 48.6 % (ref 43.0–77.0)
Platelets: 522 10*3/uL — ABNORMAL HIGH (ref 150.0–400.0)
RBC: 4.32 Mil/uL (ref 3.87–5.11)
RDW: 13.8 % (ref 11.5–15.5)
WBC: 6.2 10*3/uL (ref 4.0–10.5)

## 2018-02-08 LAB — IBC PANEL
IRON: 29 ug/dL — AB (ref 42–145)
Saturation Ratios: 5.6 % — ABNORMAL LOW (ref 20.0–50.0)
Transferrin: 367 mg/dL — ABNORMAL HIGH (ref 212.0–360.0)

## 2018-02-08 LAB — LIPID PANEL
CHOL/HDL RATIO: 3
Cholesterol: 199 mg/dL (ref 0–200)
HDL: 62 mg/dL (ref >39.00)
NONHDL: 137.23
Triglycerides: 220 mg/dL — ABNORMAL HIGH (ref 0.0–149.0)
VLDL: 44 mg/dL — ABNORMAL HIGH (ref 0.0–40.0)

## 2018-02-08 LAB — BASIC METABOLIC PANEL
BUN: 13 mg/dL (ref 6–23)
CHLORIDE: 106 meq/L (ref 96–112)
CO2: 26 meq/L (ref 19–32)
CREATININE: 1.03 mg/dL (ref 0.40–1.20)
Calcium: 9.4 mg/dL (ref 8.4–10.5)
GFR: 56.43 mL/min — ABNORMAL LOW (ref >60.00)
GLUCOSE: 91 mg/dL (ref 70–99)
Potassium: 3.8 mEq/L (ref 3.5–5.1)
Sodium: 141 mEq/L (ref 135–145)

## 2018-02-08 LAB — LDL CHOLESTEROL, DIRECT: Direct LDL: 72 mg/dL

## 2018-02-08 LAB — HEPATIC FUNCTION PANEL
ALK PHOS: 52 U/L (ref 39–117)
ALT: 9 U/L (ref 0–35)
AST: 15 U/L (ref 0–37)
Albumin: 4.2 g/dL (ref 3.5–5.2)
BILIRUBIN TOTAL: 0.2 mg/dL (ref 0.2–1.2)
Bilirubin, Direct: 0 mg/dL (ref 0.0–0.3)
Total Protein: 7.1 g/dL (ref 6.0–8.3)

## 2018-02-08 LAB — TSH: TSH: 3.86 u[IU]/mL (ref 0.35–4.50)

## 2018-04-28 ENCOUNTER — Telehealth: Payer: Self-pay | Admitting: Internal Medicine

## 2018-04-28 MED ORDER — LEVALBUTEROL TARTRATE 45 MCG/ACT IN AERO: 1.0000 | INHALATION_SPRAY | Freq: Four times a day (QID) | RESPIRATORY_TRACT | 2 refills | Status: DC | PRN

## 2018-04-28 NOTE — Telephone Encounter (Signed)
Spoke with the pt  She states needing rx sent to pharm for xopenex inhaler  I have sent this in  She states it may or may not need PA  I advised we can complete PA if the rx is denied  Rx was sent

## 2018-05-02 ENCOUNTER — Telehealth: Payer: Self-pay | Admitting: Internal Medicine

## 2018-05-02 NOTE — Telephone Encounter (Signed)
PA started on CMM for Xopenex HFA Key Code: [id number]. PA approval came through and Rx is covered until 09-13-18 REF #: [id number]. Called pharmacy and gave approval information. They will give time for the system to accept the PA approval and then contact the patient. Nothing more needed at this time.

## 2018-06-22 ENCOUNTER — Other Ambulatory Visit: Payer: Self-pay | Admitting: Internal Medicine

## 2018-06-22 DIAGNOSIS — R5383 Other fatigue: Secondary | ICD-10-CM

## 2018-08-22 ENCOUNTER — Emergency Department (HOSPITAL_BASED_OUTPATIENT_CLINIC_OR_DEPARTMENT_OTHER): Payer: Medicare Other

## 2018-08-22 ENCOUNTER — Emergency Department (HOSPITAL_BASED_OUTPATIENT_CLINIC_OR_DEPARTMENT_OTHER)
Admission: EM | Admit: 2018-08-22 | Discharge: 2018-08-22 | Disposition: A | Discharge status: Home / Self Care | Payer: Medicare Other | Source: Home / Self Care | Attending: Emergency Medicine | Admitting: Emergency Medicine

## 2018-08-22 ENCOUNTER — Ambulatory Visit: Payer: Self-pay

## 2018-08-22 ENCOUNTER — Other Ambulatory Visit: Payer: Self-pay

## 2018-08-22 ENCOUNTER — Encounter (HOSPITAL_BASED_OUTPATIENT_CLINIC_OR_DEPARTMENT_OTHER): Payer: Self-pay | Admitting: Emergency Medicine

## 2018-08-22 ENCOUNTER — Telehealth: Payer: Self-pay

## 2018-08-22 DIAGNOSIS — Z885 Allergy status to narcotic agent status: Secondary | ICD-10-CM | POA: Diagnosis not present

## 2018-08-22 DIAGNOSIS — R5383 Other fatigue: Secondary | ICD-10-CM

## 2018-08-22 DIAGNOSIS — R55 Syncope and collapse: Secondary | ICD-10-CM

## 2018-08-22 DIAGNOSIS — T8092XA Unspecified transfusion reaction, initial encounter: Secondary | ICD-10-CM | POA: Diagnosis not present

## 2018-08-22 DIAGNOSIS — T782XXA Anaphylactic shock, unspecified, initial encounter: Secondary | ICD-10-CM | POA: Diagnosis not present

## 2018-08-22 DIAGNOSIS — Z887 Allergy status to serum and vaccine status: Secondary | ICD-10-CM | POA: Diagnosis not present

## 2018-08-22 DIAGNOSIS — R079 Chest pain, unspecified: Secondary | ICD-10-CM

## 2018-08-22 DIAGNOSIS — Z8249 Family history of ischemic heart disease and other diseases of the circulatory system: Secondary | ICD-10-CM | POA: Diagnosis not present

## 2018-08-22 DIAGNOSIS — Z8349 Family history of other endocrine, nutritional and metabolic diseases: Secondary | ICD-10-CM | POA: Diagnosis not present

## 2018-08-22 DIAGNOSIS — Z7951 Long term (current) use of inhaled steroids: Secondary | ICD-10-CM | POA: Diagnosis not present

## 2018-08-22 DIAGNOSIS — Z79899 Other long term (current) drug therapy: Secondary | ICD-10-CM | POA: Insufficient documentation

## 2018-08-22 DIAGNOSIS — K921 Melena: Secondary | ICD-10-CM | POA: Diagnosis not present

## 2018-08-22 DIAGNOSIS — Z8371 Family history of colonic polyps: Secondary | ICD-10-CM | POA: Diagnosis not present

## 2018-08-22 DIAGNOSIS — Z888 Allergy status to other drugs, medicaments and biological substances status: Secondary | ICD-10-CM | POA: Diagnosis not present

## 2018-08-22 DIAGNOSIS — D62 Acute posthemorrhagic anemia: Secondary | ICD-10-CM | POA: Diagnosis not present

## 2018-08-22 DIAGNOSIS — Z803 Family history of malignant neoplasm of breast: Secondary | ICD-10-CM | POA: Diagnosis not present

## 2018-08-22 DIAGNOSIS — R0602 Shortness of breath: Secondary | ICD-10-CM | POA: Insufficient documentation

## 2018-08-22 DIAGNOSIS — M8580 Other specified disorders of bone density and structure, unspecified site: Secondary | ICD-10-CM | POA: Diagnosis not present

## 2018-08-22 DIAGNOSIS — R1013 Epigastric pain: Secondary | ICD-10-CM | POA: Diagnosis not present

## 2018-08-22 DIAGNOSIS — Z8711 Personal history of peptic ulcer disease: Secondary | ICD-10-CM | POA: Diagnosis not present

## 2018-08-22 DIAGNOSIS — Z9013 Acquired absence of bilateral breasts and nipples: Secondary | ICD-10-CM | POA: Diagnosis not present

## 2018-08-22 DIAGNOSIS — Z853 Personal history of malignant neoplasm of breast: Secondary | ICD-10-CM | POA: Insufficient documentation

## 2018-08-22 DIAGNOSIS — J45909 Unspecified asthma, uncomplicated: Secondary | ICD-10-CM

## 2018-08-22 DIAGNOSIS — D649 Anemia, unspecified: Secondary | ICD-10-CM

## 2018-08-22 DIAGNOSIS — K219 Gastro-esophageal reflux disease without esophagitis: Secondary | ICD-10-CM | POA: Diagnosis not present

## 2018-08-22 DIAGNOSIS — G43909 Migraine, unspecified, not intractable, without status migrainosus: Secondary | ICD-10-CM | POA: Diagnosis not present

## 2018-08-22 DIAGNOSIS — Z7982 Long term (current) use of aspirin: Secondary | ICD-10-CM | POA: Diagnosis not present

## 2018-08-22 DIAGNOSIS — Z881 Allergy status to other antibiotic agents status: Secondary | ICD-10-CM | POA: Diagnosis not present

## 2018-08-22 DIAGNOSIS — K589 Irritable bowel syndrome without diarrhea: Secondary | ICD-10-CM | POA: Diagnosis not present

## 2018-08-22 LAB — BASIC METABOLIC PANEL
Anion gap: 10 (ref 5–15)
BUN: 23 mg/dL (ref 8–23)
CO2: 24 mmol/L (ref 22–32)
Calcium: 9.3 mg/dL (ref 8.9–10.3)
Chloride: 105 mmol/L (ref 98–111)
Creatinine, Ser: 1.04 mg/dL — ABNORMAL HIGH (ref 0.44–1.00)
GFR calc Af Amer: >60 mL/min (ref >60)
GFR calc non Af Amer: 55 mL/min — ABNORMAL LOW (ref >60)
Glucose, Bld: 105 mg/dL — ABNORMAL HIGH (ref 70–99)
POTASSIUM: 3.3 mmol/L — AB (ref 3.5–5.1)
SODIUM: 139 mmol/L (ref 135–145)

## 2018-08-22 LAB — CBC
HCT: 24.3 % — ABNORMAL LOW (ref 36.0–46.0)
Hemoglobin: 7.5 g/dL — ABNORMAL LOW (ref 12.0–15.0)
MCH: 27.6 pg (ref 26.0–34.0)
MCHC: 30.9 g/dL (ref 30.0–36.0)
MCV: 89.3 fL (ref 80.0–100.0)
NRBC: 0 % (ref 0.0–0.2)
Platelets: 490 10*3/uL — ABNORMAL HIGH (ref 150–400)
RBC: 2.72 MIL/uL — ABNORMAL LOW (ref 3.87–5.11)
RDW: 15.3 % (ref 11.5–15.5)
WBC: 7.7 10*3/uL (ref 4.0–10.5)

## 2018-08-22 LAB — TROPONIN I: Troponin I: <0.03 ng/mL (ref <0.03)

## 2018-08-22 NOTE — ED Triage Notes (Signed)
Intermittent Sub Sternal chest pain, with SOB, and 1 episode of LOC on Friday (3 days ago).  Did not seek medical care. Sx intermittent since then but no more LOC.

## 2018-08-22 NOTE — Telephone Encounter (Signed)
I have talked with patient and advised that we do have free standing ED on [address]--usu wait time is very minimal----if patient needs to be admitted, she will be transported from [address] to hospital via ambulance---I don't have any openings in office now (for today)---pt should really be seen today and do multiple tests, could possibly be heart related or lung related---may require more testing than our office can provide quickly---pt has agreed to go to [address] ED---advised to call back for office visit later this week if she changes her mind

## 2018-08-22 NOTE — Telephone Encounter (Signed)
Please advise if this is appropriate for appt

## 2018-08-22 NOTE — Telephone Encounter (Signed)
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

## 2018-08-22 NOTE — ED Provider Notes (Signed)
[facility] EMERGENCY DEPARTMENT Provider Note   CSN: [id number] Arrival date & time: 08/22/18  1154     History   Chief Complaint Chief Complaint  Patient presents with  . Chest Pain    HPI [name] [name] is a 67 y.o. female.  The history is provided by the patient, the spouse and medical records. No language interpreter was used.  Loss of Consciousness   This is a new problem. The current episode started yesterday. The problem occurs rarely. The problem has been resolved. She lost consciousness for a period of less than one minute. Associated symptoms include chest pain, light-headedness and malaise/fatigue. Pertinent negatives include abdominal pain, back pain, confusion, congestion, diaphoresis, fever, headaches, nausea, palpitations, vomiting and weakness. She has tried nothing for the symptoms.  Chest Pain   This is a new problem. The current episode started more than 2 days ago. The problem occurs constantly. The problem has not changed since onset.The pain is associated with exertion. The pain is present in the substernal region. The pain is moderate. The quality of the pain is described as heavy and exertional. The pain does not radiate. The symptoms are aggravated by exertion. Associated symptoms include malaise/fatigue, shortness of breath and syncope. Pertinent negatives include no abdominal pain, no back pain, no cough, no diaphoresis, no fever, no headaches, no nausea, no palpitations, no sputum production, no vomiting and no weakness. She has tried nothing for the symptoms. The treatment provided no relief.    Past Medical History:  Diagnosis Date  . ASTHMA   . BREAST CANCER, HX OF 11/1979  . GERD   . Irritable bowel syndrome   . MIGRAINE HEADACHE     Patient Active Problem List   Diagnosis Date Noted  . Iron deficiency anemia 02/04/2018  . Preventative health care 02/03/2017  . Osteopenia 02/03/2017  . Hypertriglyceridemia 08/22/2015  . H/O TB skin  testing 12/03/2012  . Anaphylactic reaction   . Thrombocytosis ([place]) 01/19/2011  . MIGRAINE HEADACHE 08/10/2008  . Non-allergic asthma 08/10/2008  . GERD 08/10/2008  . Irritable bowel syndrome 08/10/2008  . Headache(784.0) 08/10/2008  . BREAST CANCER, HX OF 08/10/2008    Past Surgical History:  Procedure Laterality Date  . ABDOMINAL HYSTERECTOMY  11/1979  . BREAST SURGERY    . Left wrist  1963  . MASTECTOMY  11/1979   Bilaterally & implants  . TONSILLECTOMY  1955  . TUBAL LIGATION  1975     OB History   None      Home Medications    Prior to Admission medications   Medication Sig Start Date End Date Taking? Authorizing Provider  Ascorbic Acid (VITAMIN C) 1000 MG tablet Take 1,000 mg by mouth daily.    [provider]  aspirin-acetaminophen-caffeine (EXCEDRIN MIGRAINE) [phone] MG per tablet Take 1 tablet by mouth every 6 (six) hours as needed. For migraine    [provider]  Calcium Carbonate-Vit D-Min 600-200 MG-UNIT TABS Take 1 tablet by mouth 2 (two) times daily.    [provider]  Cyanocobalamin (VITAMIN B 12 PO) Take 1 tablet by mouth daily.    [provider]  cyclobenzaprine (FLEXERIL) 10 MG tablet Take 1 tablet (10 mg total) by mouth 3 (three) times daily. 06/22/18   Biagio Borg, MD  diphenhydrAMINE (BENADRYL) 25 MG tablet Take 25 mg by mouth every 8 (eight) hours as needed. For allergies    [provider]  EPINEPHrine (EPIPEN 2-PAK) 0.3 mg/0.3 mL IJ SOAJ injection  Inject 0.3 mLs (0.3 mg total) into the muscle once for 1 dose. 09/13/17 09/13/17  Deneise Lever, MD  levalbuterol [facility] HFA) 45 MCG/ACT inhaler Inhale 1-2 puffs into the lungs every 6 (six) hours as needed. For shortness of breath 04/28/18   Baird Lyons D, MD  levalbuterol Penne Lash) 1.25 MG/3ML nebulizer solution Take 1.25 mg by nebulization every 4 (four) hours as needed for wheezing. 09/11/16   Baird Lyons D, MD  Melatonin 10 MG CAPS Take 10 mg  by mouth at bedtime.    [provider]  mometasone-formoterol (DULERA) 200-5 MCG/ACT AERO Inhale 2 puffs into the lungs 2 (two) times daily. Rinse mouth 08/23/13   Rowe Clack, MD  Multiple Vitamin (MULTIVITAMIN) tablet Take 1 tablet by mouth daily.      [provider]  vitamin E 400 UNIT capsule Take 400 Units by mouth daily.      [provider]  zolpidem (AMBIEN) 10 MG tablet TAKE 1 TABLET BY MOUTH AT BEDTIME 01/24/18   Biagio Borg, MD    Family History Family History  Problem Relation Age of Onset  . Coronary artery disease Father 67       CABG age 41  . Breast cancer Mother   . Uterine cancer Mother   . Coronary artery disease Mother   . Endometriosis Sister   . Colon polyps Sister 21       1/2 sister - same dad  . Irritable bowel syndrome Sister        1/2 sister, C prone  . Hyperlipidemia Brother        1/2 bro, same dad  . Hypertension Brother        1/2 bro, same dad    Social History Social History   Tobacco Use  . Smoking status: Never Smoker  . Smokeless tobacco: Never Used  Substance Use Topics  . Alcohol use: Yes    Alcohol/week: 0.0 standard drinks    Comment: occ  . Drug use: No     Allergies   Albuterol sulfate; Amoxicillin-pot clavulanate; Epipen [epinephrine]; Fluzone; Influenza vaccines; Morphine; Povidone-iodine; Sulfonamide derivatives; Atrovent [ipratropium]; Ciprofloxacin; Hyoscyamine sulfate; Meperidine hcl; Other; Pepcid [famotidine]; Pneumococcal vaccines; Ppd [tuberculin purified protein derivative]; Reglan [metoclopramide]; and Zostavax [zoster vaccine live]   Review of Systems Review of Systems  Constitutional: Positive for fatigue and malaise/fatigue. Negative for chills, diaphoresis and fever.  HENT: Negative for congestion and rhinorrhea.   Eyes: Negative for visual disturbance.  Respiratory: Positive for shortness of breath. Negative for cough, sputum production, chest tightness and wheezing.     Cardiovascular: Positive for chest pain and syncope. Negative for palpitations and leg swelling.  Gastrointestinal: Negative for abdominal distention, abdominal pain, anal bleeding, blood in stool, constipation, diarrhea, nausea and vomiting.  Genitourinary: Negative for dysuria.  Musculoskeletal: Negative for back pain, neck pain and neck stiffness.  Skin: Negative for rash and wound.  Neurological: Positive for syncope and light-headedness. Negative for weakness and headaches.  Psychiatric/Behavioral: Negative for agitation and confusion.  All other systems reviewed and are negative.    Physical Exam Updated Vital Signs BP 95/78 (BP Location: Right Arm)   Pulse 89   Temp 98.1 F (36.7 C)   Resp 14   Ht 5\' 3"  (1.6 m)   Wt 65.8 kg   SpO2 100%   BMI 25.69 kg/m   Physical Exam  Constitutional: She is oriented to person, place, and time. She appears well-developed and well-nourished.  Non-toxic appearance. She does  not appear ill. No distress.  HENT:  Head: Normocephalic and atraumatic.  Mouth/Throat: Oropharynx is clear and moist. No oropharyngeal exudate.  Eyes: Pupils are equal, round, and reactive to light. Conjunctivae are normal.  Neck: Normal range of motion. Neck supple.  Cardiovascular: Normal rate and regular rhythm.  No murmur heard. Pulmonary/Chest: Effort normal and breath sounds normal. No stridor. No tachypnea. No respiratory distress. She has no decreased breath sounds. She has no wheezes. She has no rhonchi. She has no rales. She exhibits no tenderness.  Abdominal: Soft. There is no tenderness.  Musculoskeletal: She exhibits no edema or tenderness.  Neurological: She is alert and oriented to person, place, and time. No cranial nerve deficit or sensory deficit. She exhibits normal muscle tone.  Skin: Skin is warm and dry. Capillary refill takes less than 2 seconds. No rash noted. She is not diaphoretic. No erythema. There is pallor.  Psychiatric: She has a normal  mood and affect.  Nursing note and vitals reviewed.    ED Treatments / Results  Labs (all labs ordered are listed, but only abnormal results are displayed) Labs Reviewed  BASIC METABOLIC PANEL - Abnormal; Notable for the following components:      Result Value   Potassium 3.3 (*)    Glucose, Bld 105 (*)    Creatinine, Ser 1.04 (*)    GFR calc non Af Amer 55 (*)    All other components within normal limits  CBC - Abnormal; Notable for the following components:   RBC 2.72 (*)    Hemoglobin 7.5 (*)    HCT 24.3 (*)    Platelets 490 (*)    All other components within normal limits  TROPONIN I    EKG EKG Interpretation  Date/Time:  Monday August 22 2018 12:00:56 EST Ventricular Rate:  92 PR Interval:  150 QRS Duration: 72 QT Interval:  368 QTC Calculation: 455 R Axis:   -19 Text Interpretation:  Normal sinus rhythm Nonspecific T wave abnormality Abnormal ECG No prior ECG for comparuson.  No STEMI Confirmed by Antony Blackbird [phone] on 08/22/2018 12:30:22 PM   Radiology Dg Chest 2 View  Result Date: 08/22/2018 CLINICAL DATA:  Chest pain EXAM: CHEST - 2 VIEW COMPARISON:  11/29/2012 FINDINGS: The heart size and mediastinal contours are within normal limits. Both lungs are clear. The visualized skeletal structures are unremarkable. IMPRESSION: No active cardiopulmonary disease. Electronically Signed   By: Franchot Gallo M.D.   On: 08/22/2018 12:57    Procedures Procedures (including critical care time)  Medications Ordered in ED Medications - No data to display   Initial Impression / Assessment and Plan / ED Course  I have reviewed the triage vital signs and the nursing notes.  Pertinent labs & imaging results that were available during my care of the patient were reviewed by me and considered in my medical decision making (see chart for details).     [name] [name] is a 67 y.o. female with a past medical history significant for prior breast cancer, migraines, prior  GI bleeds, GERD, and irritable bowel syndrome who presents with fatigue, chest pain, exertional shortness of breath, and a syncopal episode.  Patient reports that for the last few days she has been having left-sided chest pain.  She describes as a pressure and tightness.  She says it does not radiate however she has had exertional shortness of breath with it.  She reports the pain is not significantly pleuritic.  She says that while in the  shower 3 days ago she had a syncopal episode lasting for several seconds.  She denies traumatic injury.  She says that she is feeling extremely drained fatigued and tired.  She denies any leg pain or leg swelling.  No reported rectal bleeding grossly.  No nausea or vomiting.  No significant diaphoresis.  Patient was hesitant to come to the emergency department and denies other complaints.  On exam, patient is pale.  Chest is nontender and lungs are clear.  No murmur was appreciated on my exam.  Abdomen was nontender.  Legs are nonedematous.  EKG shows no STEMI.  No significant arrhythmia.  Patient had laboratory testing showing evidence of new anemia with a hemoglobin of 7.5, this is down from greater than 11 several months ago.  Suspect symptomatic anemia led to the syncopal episode exertional chest pain and shortness of breath.  Patient refused fecal occult testing to look for recurrent GI bleed.  Patient refused delta troponin.  Patient refused admission for high risk syncope in the setting of significant chest pain, new anemia, and exertional symptoms.  Patient reports that she wants to follow-up with her PCP and subsequently see a cardiologist.  She understands the risks of her hyper syncope including and up to death if her hemoglobin continues to drop and she has cardiac injury or MI.  Patient was able to ambulate around the emergency department and was able to eat and drink.  She does not want to be admitted and is not edition further management at this time.   Patient is a former [employer] scientist and was felt to be reliable to return if any symptoms change or worsen.  We had a long care decision in conversation about plan of care and with patient and husband understand the risks of leaving.  Patient had no other questions or concerns and was discharged stable condition with exertional chest pain shortness of breath as well as syncope in the setting of new anemia.   Final Clinical Impressions(s) / ED Diagnoses   Final diagnoses:  Syncope, unspecified syncope type  Chest pain, unspecified type  Shortness of breath  Symptomatic anemia  Fatigue, unspecified type    ED Discharge Orders    None     Clinical Impression: 1. Syncope, unspecified syncope type   2. Chest pain, unspecified type   3. Shortness of breath   4. Symptomatic anemia   5. Fatigue, unspecified type     Disposition: Discharge  Condition: Good  I have discussed the results, Dx and Tx plan with the pt(& family if present). He/she/they expressed understanding and agree(s) with the plan. Discharge instructions discussed at great length. Strict return precautions discussed and pt &/or family have verbalized understanding of the instructions. No further questions at time of discharge.    Discharge Medication List as of 08/22/2018  3:07 PM      Follow Up: Biagio Borg, MD [place] [place] [id number] [phone]  In 1 day Please call your PCP today to discuss follow-up in several days for repeat blood testing and management.     Brydan Downard, Gwenyth Allegra, MD 08/22/18 [phone]

## 2018-08-22 NOTE — Discharge Instructions (Signed)
Your work-up today showed evidence of new anemia.  Given your history of GI bleeds, your GI tract is the likely source.  We discussed obtaining a fecal occult test however this is not something he wanted today.  We also discussed trending your troponins as you are having exertional shortness of breath and chest pressure in the setting of your syncopal episode.  We had a shared decision-making conversation and offered you admission for further management however you elected to follow-up with your primary doctor for further management.  Please be extremely aware of your symptoms and if you have any new or worsening symptoms, worsened chest pain, worsened shortness of breath, or feel you are going to pass out again, please call the nearest emergency department.

## 2018-08-22 NOTE — Telephone Encounter (Signed)
Spoke with Jonelle Sidle about pt, she spoke with her and got pt tp agree to ED on [address] to be evaluated.

## 2018-08-22 NOTE — ED Notes (Signed)
Patient transported to X-ray 

## 2018-08-22 NOTE — ED Notes (Signed)
NAD at this time. Pt is stable and going home.  

## 2018-08-22 NOTE — Telephone Encounter (Signed)
Pt c/o mid sternal chest pain and tightness with deep breaths that began Friday. Pt denies any radiating pains. Pt stated the chest tightness come and goes  And gets worse with exertion. Pt stated that the episodes of chest pain lasts a few minutes to 30 minutes. Pt also with dizziness. Pt had a syncopal episode in shower Friday. Pt stated that she is having occasional dizziness since then, nausea Friday. Pt c/o SOB that began as moderate but pt stated that it is better now but still present. Pt thinks that stress is causing her chest tightness. Pt had nausea on Friday. Pt stated that she feels very "washed out" and fatigued. Pt advised to go to ED for evaluation. Pt refused. Called PCP office ([name]) and stated pt needed to go to ED. Pt very reluctant going to the ED. Pt stated she didn't want to sit in the ED for 4 hours. Pt stated that she will go, but unsure if she will go. Routing to office.  Reason for Disposition . Dizziness or lightheadedness  Answer Assessment - Initial Assessment Questions 1. LOCATION: "Where does it hurt?"       Mid sternal - tightness with deep breaths 2. RADIATION: "Does the pain go anywhere else?" (e.g., into neck, jaw, arms, back)     no 3. ONSET: "When did the chest pain begin?" (Minutes, hours or days)      Friday 4. PATTERN "Does the pain come and go, or has it been constant since it started?"  "Does it get worse with exertion?"      Come and go- gets worse with exertion 5. DURATION: "How long does it last" (e.g., seconds, minutes, hours)     Few minutes to 30 minutes 6. SEVERITY: "How bad is the pain?"  (e.g., Scale 1-10; mild, moderate, or severe)    - MILD (1-3): doesn't interfere with normal activities     - MODERATE (4-7): interferes with normal activities or awakens from sleep    - SEVERE (8-10): excruciating pain, unable to do any normal activities       mild 7. CARDIAC RISK FACTORS: "Do you have any history of heart problems or risk factors for heart  disease?" (e.g., prior heart attack, angina; high blood pressure, diabetes, being overweight, high cholesterol, smoking, or strong family history of heart disease)     Strong family h/o heart disease, overweight 8. PULMONARY RISK FACTORS: "Do you have any history of lung disease?"  (e.g., blood clots in lung, asthma, emphysema, birth control pills)     asthma 9. CAUSE: "What do you think is causing the chest pain?"     Stress  10. OTHER SYMPTOMS: "Do you have any other symptoms?" (e.g., dizziness, nausea, vomiting, sweating, fever, difficulty breathing, cough)       Very dizzy Friday and syncopal episode in shower, occasion dizziness since then, nausea Friday, SOB moderate but is now batter  11. PREGNANCY: "Is there any chance you are pregnant?" "When was your last menstrual period?" n/a  Protocols used: CHEST PAIN-A-AH

## 2018-08-22 NOTE — Telephone Encounter (Signed)
I agree with RN, pt needs to be seen at ED for the combination of symptoms that she is having.

## 2018-08-23 ENCOUNTER — Telehealth: Payer: Self-pay

## 2018-08-23 NOTE — Telephone Encounter (Signed)
Patient has scheduled appt 12/11 at 10am with dr Jenny Reichmann

## 2018-08-23 NOTE — Telephone Encounter (Signed)
Routing to dr Jenny Reichmann to review and advise---pt went to ED yesterday---labs show she is very anemic---was offered blood transfusion and refused---agreed to follow up with pcp soon---I think patient needs to be seen quickly based on labs----and I feel she is prob experiencing some kind of GI bleed based on hg levels---do you want me to make appointment for patient to come in and see you or should we try to get her directly into GI this week? Please advise, I will call patient and try to schedule appt, thanks

## 2018-08-23 NOTE — Telephone Encounter (Signed)
Pt has refused GI referral in the past  OK to make ROV next available, any provider ok

## 2018-08-24 ENCOUNTER — Encounter: Payer: Self-pay | Admitting: Internal Medicine

## 2018-08-24 ENCOUNTER — Inpatient Hospital Stay (HOSPITAL_COMMUNITY): Payer: Medicare Other

## 2018-08-24 ENCOUNTER — Inpatient Hospital Stay (HOSPITAL_COMMUNITY)
Admission: EM | Admit: 2018-08-24 | Discharge: 2018-08-26 | DRG: 378 | Disposition: A | Discharge status: Home / Self Care | Payer: Medicare Other | Source: Ambulatory Visit | Attending: Internal Medicine | Admitting: Internal Medicine

## 2018-08-24 ENCOUNTER — Telehealth: Payer: Self-pay

## 2018-08-24 ENCOUNTER — Other Ambulatory Visit (INDEPENDENT_AMBULATORY_CARE_PROVIDER_SITE_OTHER): Payer: Medicare Other

## 2018-08-24 ENCOUNTER — Telehealth: Payer: Self-pay | Admitting: Gastroenterology

## 2018-08-24 ENCOUNTER — Encounter (HOSPITAL_COMMUNITY): Payer: Self-pay | Admitting: Emergency Medicine

## 2018-08-24 ENCOUNTER — Ambulatory Visit (INDEPENDENT_AMBULATORY_CARE_PROVIDER_SITE_OTHER): Payer: Medicare Other | Admitting: Internal Medicine

## 2018-08-24 ENCOUNTER — Other Ambulatory Visit: Payer: Self-pay

## 2018-08-24 VITALS — BP 116/68 | HR 96 | Temp 98.0°F | Ht 63.0 in | Wt 148.0 lb

## 2018-08-24 DIAGNOSIS — K922 Gastrointestinal hemorrhage, unspecified: Secondary | ICD-10-CM | POA: Diagnosis not present

## 2018-08-24 DIAGNOSIS — R55 Syncope and collapse: Secondary | ICD-10-CM | POA: Diagnosis not present

## 2018-08-24 DIAGNOSIS — Z7951 Long term (current) use of inhaled steroids: Secondary | ICD-10-CM

## 2018-08-24 DIAGNOSIS — Z8371 Family history of colonic polyps: Secondary | ICD-10-CM

## 2018-08-24 DIAGNOSIS — Z9013 Acquired absence of bilateral breasts and nipples: Secondary | ICD-10-CM

## 2018-08-24 DIAGNOSIS — Z8249 Family history of ischemic heart disease and other diseases of the circulatory system: Secondary | ICD-10-CM | POA: Diagnosis not present

## 2018-08-24 DIAGNOSIS — Z881 Allergy status to other antibiotic agents status: Secondary | ICD-10-CM | POA: Diagnosis not present

## 2018-08-24 DIAGNOSIS — Z8349 Family history of other endocrine, nutritional and metabolic diseases: Secondary | ICD-10-CM

## 2018-08-24 DIAGNOSIS — D509 Iron deficiency anemia, unspecified: Secondary | ICD-10-CM | POA: Diagnosis not present

## 2018-08-24 DIAGNOSIS — T782XXA Anaphylactic shock, unspecified, initial encounter: Secondary | ICD-10-CM | POA: Diagnosis not present

## 2018-08-24 DIAGNOSIS — D62 Acute posthemorrhagic anemia: Secondary | ICD-10-CM

## 2018-08-24 DIAGNOSIS — J45909 Unspecified asthma, uncomplicated: Secondary | ICD-10-CM | POA: Diagnosis present

## 2018-08-24 DIAGNOSIS — Z853 Personal history of malignant neoplasm of breast: Secondary | ICD-10-CM

## 2018-08-24 DIAGNOSIS — G43909 Migraine, unspecified, not intractable, without status migrainosus: Secondary | ICD-10-CM | POA: Diagnosis not present

## 2018-08-24 DIAGNOSIS — M8580 Other specified disorders of bone density and structure, unspecified site: Secondary | ICD-10-CM | POA: Diagnosis present

## 2018-08-24 DIAGNOSIS — Z803 Family history of malignant neoplasm of breast: Secondary | ICD-10-CM

## 2018-08-24 DIAGNOSIS — R0600 Dyspnea, unspecified: Secondary | ICD-10-CM

## 2018-08-24 DIAGNOSIS — R062 Wheezing: Secondary | ICD-10-CM | POA: Diagnosis not present

## 2018-08-24 DIAGNOSIS — D649 Anemia, unspecified: Secondary | ICD-10-CM | POA: Diagnosis not present

## 2018-08-24 DIAGNOSIS — K589 Irritable bowel syndrome without diarrhea: Secondary | ICD-10-CM | POA: Diagnosis present

## 2018-08-24 DIAGNOSIS — D500 Iron deficiency anemia secondary to blood loss (chronic): Secondary | ICD-10-CM | POA: Diagnosis not present

## 2018-08-24 DIAGNOSIS — Z90710 Acquired absence of both cervix and uterus: Secondary | ICD-10-CM | POA: Diagnosis not present

## 2018-08-24 DIAGNOSIS — Z8049 Family history of malignant neoplasm of other genital organs: Secondary | ICD-10-CM | POA: Diagnosis not present

## 2018-08-24 DIAGNOSIS — T8092XA Unspecified transfusion reaction, initial encounter: Secondary | ICD-10-CM | POA: Diagnosis not present

## 2018-08-24 DIAGNOSIS — Z885 Allergy status to narcotic agent status: Secondary | ICD-10-CM | POA: Diagnosis not present

## 2018-08-24 DIAGNOSIS — R918 Other nonspecific abnormal finding of lung field: Secondary | ICD-10-CM | POA: Diagnosis not present

## 2018-08-24 DIAGNOSIS — Y848 Other medical procedures as the cause of abnormal reaction of the patient, or of later complication, without mention of misadventure at the time of the procedure: Secondary | ICD-10-CM | POA: Diagnosis not present

## 2018-08-24 DIAGNOSIS — Z8711 Personal history of peptic ulcer disease: Secondary | ICD-10-CM | POA: Diagnosis not present

## 2018-08-24 DIAGNOSIS — Z7982 Long term (current) use of aspirin: Secondary | ICD-10-CM | POA: Diagnosis not present

## 2018-08-24 DIAGNOSIS — Z887 Allergy status to serum and vaccine status: Secondary | ICD-10-CM

## 2018-08-24 DIAGNOSIS — K219 Gastro-esophageal reflux disease without esophagitis: Secondary | ICD-10-CM | POA: Diagnosis not present

## 2018-08-24 DIAGNOSIS — K921 Melena: Secondary | ICD-10-CM | POA: Diagnosis not present

## 2018-08-24 DIAGNOSIS — Z888 Allergy status to other drugs, medicaments and biological substances status: Secondary | ICD-10-CM

## 2018-08-24 DIAGNOSIS — R1013 Epigastric pain: Secondary | ICD-10-CM

## 2018-08-24 HISTORY — DX: Anemia, unspecified: D64.9

## 2018-08-24 LAB — HEPATIC FUNCTION PANEL
ALBUMIN: 4 g/dL (ref 3.5–5.2)
ALT: 9 U/L (ref 0–35)
AST: 16 U/L (ref 0–37)
Alkaline Phosphatase: 49 U/L (ref 39–117)
Bilirubin, Direct: 0 mg/dL (ref 0.0–0.3)
Total Bilirubin: 0.3 mg/dL (ref 0.2–1.2)
Total Protein: 6.6 g/dL (ref 6.0–8.3)

## 2018-08-24 LAB — COMPREHENSIVE METABOLIC PANEL
ALT: 14 U/L (ref 0–44)
AST: 21 U/L (ref 15–41)
Albumin: 3.6 g/dL (ref 3.5–5.0)
Alkaline Phosphatase: 56 U/L (ref 38–126)
Anion gap: 15 (ref 5–15)
BUN: 15 mg/dL (ref 8–23)
CO2: 19 mmol/L — AB (ref 22–32)
Calcium: 9 mg/dL (ref 8.9–10.3)
Chloride: 109 mmol/L (ref 98–111)
Creatinine, Ser: 1.21 mg/dL — ABNORMAL HIGH (ref 0.44–1.00)
GFR calc Af Amer: 53 mL/min — ABNORMAL LOW (ref >60)
GFR calc non Af Amer: 46 mL/min — ABNORMAL LOW (ref >60)
Glucose, Bld: 136 mg/dL — ABNORMAL HIGH (ref 70–99)
Potassium: 3.3 mmol/L — ABNORMAL LOW (ref 3.5–5.1)
Sodium: 143 mmol/L (ref 135–145)
TOTAL PROTEIN: 6.4 g/dL — AB (ref 6.5–8.1)
Total Bilirubin: 0.6 mg/dL (ref 0.3–1.2)

## 2018-08-24 LAB — CBC WITH DIFFERENTIAL/PLATELET
Basophils Absolute: 0.1 10*3/uL (ref 0.0–0.1)
Basophils Relative: 1.6 % (ref 0.0–3.0)
Eosinophils Absolute: 0.2 10*3/uL (ref 0.0–0.7)
Eosinophils Relative: 4.3 % (ref 0.0–5.0)
HCT: 21.4 % — CL (ref 36.0–46.0)
Hemoglobin: 7.2 g/dL — CL (ref 12.0–15.0)
LYMPHS PCT: 35.9 % (ref 12.0–46.0)
Lymphs Abs: 2 10*3/uL (ref 0.7–4.0)
MCHC: 33.7 g/dL (ref 30.0–36.0)
MCV: 84.5 fl (ref 78.0–100.0)
Monocytes Absolute: 0.4 10*3/uL (ref 0.1–1.0)
Monocytes Relative: 7.4 % (ref 3.0–12.0)
Neutro Abs: 2.9 10*3/uL (ref 1.4–7.7)
Neutrophils Relative %: 50.8 % (ref 43.0–77.0)
Platelets: 585 10*3/uL — ABNORMAL HIGH (ref 150.0–400.0)
RBC: 2.53 Mil/uL — ABNORMAL LOW (ref 3.87–5.11)
RDW: 15.8 % — ABNORMAL HIGH (ref 11.5–15.5)
WBC: 5.7 10*3/uL (ref 4.0–10.5)

## 2018-08-24 LAB — ABO/RH: ABO/RH(D): O POS

## 2018-08-24 LAB — URINALYSIS, COMPLETE (UACMP) WITH MICROSCOPIC
Bilirubin Urine: NEGATIVE
Glucose, UA: NEGATIVE mg/dL
Hgb urine dipstick: NEGATIVE
Ketones, ur: NEGATIVE mg/dL
Nitrite: NEGATIVE
Protein, ur: NEGATIVE mg/dL
Specific Gravity, Urine: 1.006 (ref 1.005–1.030)
pH: 6 (ref 5.0–8.0)

## 2018-08-24 LAB — PROTIME-INR
INR: 1 ratio (ref 0.8–1.0)
Prothrombin Time: 11.5 s (ref 9.6–13.1)

## 2018-08-24 LAB — BASIC METABOLIC PANEL
BUN: 18 mg/dL (ref 6–23)
CO2: 26 meq/L (ref 19–32)
Calcium: 9.5 mg/dL (ref 8.4–10.5)
Chloride: 106 mEq/L (ref 96–112)
Creatinine, Ser: 1.19 mg/dL (ref 0.40–1.20)
GFR: 47.69 mL/min — ABNORMAL LOW (ref >60.00)
GLUCOSE: 105 mg/dL — AB (ref 70–99)
Potassium: 3.9 mEq/L (ref 3.5–5.1)
Sodium: 141 mEq/L (ref 135–145)

## 2018-08-24 LAB — PREPARE RBC (CROSSMATCH)

## 2018-08-24 LAB — LACTIC ACID, PLASMA: Lactic Acid, Venous: 3.7 mmol/L (ref 0.5–1.9)

## 2018-08-24 LAB — TROPONIN I: Troponin I: <0.03 ng/mL (ref <0.03)

## 2018-08-24 MED ORDER — METHYLPREDNISOLONE SODIUM SUCC 125 MG IJ SOLR
80.0000 mg | Freq: Three times a day (TID) | INTRAMUSCULAR | Status: DC
  Administered 2018-08-24 – 2018-08-25 (×4): 80 mg via INTRAVENOUS
  Filled 2018-08-24 (×4): qty 2

## 2018-08-24 MED ORDER — DIPHENHYDRAMINE HCL 25 MG PO CAPS: 25.0000 mg | ORAL_CAPSULE | Freq: Three times a day (TID) | ORAL | Status: DC | PRN

## 2018-08-24 MED ORDER — MOMETASONE FURO-FORMOTEROL FUM 200-5 MCG/ACT IN AERO
2.0000 | INHALATION_SPRAY | Freq: Two times a day (BID) | RESPIRATORY_TRACT | Status: DC
  Administered 2018-08-25: 2 via RESPIRATORY_TRACT
  Filled 2018-08-24 (×2): qty 8.8

## 2018-08-24 MED ORDER — DIPHENHYDRAMINE HCL 50 MG/ML IJ SOLN: 50.0000 mg | Freq: Once | INTRAMUSCULAR | Status: DC

## 2018-08-24 MED ORDER — SODIUM CHLORIDE 0.9% IV SOLUTION
Freq: Once | INTRAVENOUS | Status: AC
  Administered 2018-08-24: 18:00:00 via INTRAVENOUS

## 2018-08-24 MED ORDER — DIPHENHYDRAMINE HCL 50 MG/ML IJ SOLN
25.0000 mg | Freq: Four times a day (QID) | INTRAMUSCULAR | Status: AC
  Administered 2018-08-24 – 2018-08-25 (×4): 25 mg via INTRAVENOUS
  Filled 2018-08-24 (×4): qty 1

## 2018-08-24 MED ORDER — PANTOPRAZOLE SODIUM 40 MG PO TBEC: 40.0000 mg | DELAYED_RELEASE_TABLET | Freq: Every day | ORAL | 3 refills | Status: DC

## 2018-08-24 MED ORDER — PANTOPRAZOLE SODIUM 40 MG IV SOLR: 40.0000 mg | Freq: Two times a day (BID) | INTRAVENOUS | Status: DC

## 2018-08-24 MED ORDER — VITAMIN E 180 MG (400 UNIT) PO CAPS
400.0000 [IU] | ORAL_CAPSULE | Freq: Every day | ORAL | Status: DC
  Administered 2018-08-25 – 2018-08-26 (×2): 400 [IU] via ORAL
  Filled 2018-08-24 (×4): qty 1

## 2018-08-24 MED ORDER — ONDANSETRON HCL 4 MG PO TABS: 4.0000 mg | ORAL_TABLET | Freq: Four times a day (QID) | ORAL | Status: DC | PRN

## 2018-08-24 MED ORDER — MELATONIN 3 MG PO TABS
9.0000 mg | ORAL_TABLET | Freq: Every day | ORAL | Status: DC
  Administered 2018-08-25: 9 mg via ORAL
  Filled 2018-08-24 (×3): qty 3

## 2018-08-24 MED ORDER — CALCIUM CARBONATE-VITAMIN D 500-200 MG-UNIT PO TABS
1.0000 | ORAL_TABLET | Freq: Two times a day (BID) | ORAL | Status: DC
  Administered 2018-08-25 – 2018-08-26 (×3): 1 via ORAL
  Filled 2018-08-24 (×5): qty 1

## 2018-08-24 MED ORDER — CYCLOBENZAPRINE HCL 10 MG PO TABS
10.0000 mg | ORAL_TABLET | Freq: Three times a day (TID) | ORAL | Status: DC
  Administered 2018-08-24 – 2018-08-25 (×2): 10 mg via ORAL
  Filled 2018-08-24 (×5): qty 1

## 2018-08-24 MED ORDER — PANTOPRAZOLE SODIUM 40 MG IV SOLR
40.0000 mg | Freq: Once | INTRAVENOUS | Status: AC
  Administered 2018-08-24: 40 mg via INTRAVENOUS
  Filled 2018-08-24: qty 40

## 2018-08-24 MED ORDER — VITAMIN C 500 MG PO TABS
1000.0000 mg | ORAL_TABLET | Freq: Every day | ORAL | Status: DC
  Administered 2018-08-25 – 2018-08-26 (×2): 1000 mg via ORAL
  Filled 2018-08-24 (×2): qty 2

## 2018-08-24 MED ORDER — LEVALBUTEROL HCL 1.25 MG/0.5ML IN NEBU
1.2500 mg | INHALATION_SOLUTION | RESPIRATORY_TRACT | Status: DC | PRN
  Administered 2018-08-24: 1.25 mg via RESPIRATORY_TRACT
  Filled 2018-08-24 (×2): qty 0.5

## 2018-08-24 MED ORDER — LEVALBUTEROL TARTRATE 45 MCG/ACT IN AERO: 1.0000 | INHALATION_SPRAY | Freq: Four times a day (QID) | RESPIRATORY_TRACT | Status: DC | PRN

## 2018-08-24 MED ORDER — SODIUM CHLORIDE 0.9 % IV SOLN
8.0000 mg/h | INTRAVENOUS | Status: DC
  Filled 2018-08-24 (×3): qty 80

## 2018-08-24 MED ORDER — ONDANSETRON HCL 4 MG/2ML IJ SOLN: 4.0000 mg | Freq: Four times a day (QID) | INTRAMUSCULAR | Status: DC | PRN

## 2018-08-24 MED ORDER — BISACODYL 10 MG RE SUPP: 10.0000 mg | Freq: Every day | RECTAL | Status: DC | PRN

## 2018-08-24 MED ORDER — SODIUM CHLORIDE 0.9 % IV SOLN
80.0000 mg | Freq: Once | INTRAVENOUS | Status: AC
  Administered 2018-08-24: 80 mg via INTRAVENOUS
  Filled 2018-08-24 (×2): qty 80

## 2018-08-24 MED ORDER — DOCUSATE SODIUM 100 MG PO CAPS
100.0000 mg | ORAL_CAPSULE | Freq: Two times a day (BID) | ORAL | Status: DC | PRN
  Filled 2018-08-24: qty 1

## 2018-08-24 MED ORDER — DIPHENHYDRAMINE HCL 50 MG/ML IJ SOLN
50.0000 mg | INTRAMUSCULAR | Status: AC
  Administered 2018-08-24: 50 mg via INTRAVENOUS

## 2018-08-24 MED ORDER — METHYLPREDNISOLONE SODIUM SUCC 125 MG IJ SOLR
125.0000 mg | INTRAMUSCULAR | Status: AC
  Administered 2018-08-24: 125 mg via INTRAVENOUS

## 2018-08-24 MED ORDER — LACTATED RINGERS IV SOLN
INTRAVENOUS | Status: DC
  Administered 2018-08-24: 19:00:00 via INTRAVENOUS
  Administered 2018-08-25: 75 mL/h via INTRAVENOUS

## 2018-08-24 MED ORDER — ZOLPIDEM TARTRATE 5 MG PO TABS
5.0000 mg | ORAL_TABLET | Freq: Every day | ORAL | Status: DC
  Administered 2018-08-24 – 2018-08-25 (×2): 5 mg via ORAL
  Filled 2018-08-24 (×2): qty 1

## 2018-08-24 NOTE — Progress Notes (Signed)
Patient refusing Bed alarm, pt got unconscious and fell at home.  Educated about the protocol but still refused.

## 2018-08-24 NOTE — ED Triage Notes (Signed)
Pt arrives to ED from home with complaints of loose stools on and off for the last three weeks and a hemoglobin of 7. Pt stated shed had a black tarry stool yesterday. Pt reports no use of NSAIDS.  Pt placed in position of comfort with bed locked and lowered, call bell in reach.

## 2018-08-24 NOTE — Consult Note (Addendum)
[name] Consult [facility]   History [name] [name] MRN # [id number]  Date of Admission: 08/24/2018 Date of Consultation: 08/24/2018 Referring physician: Dr. Steffanie Dunn, [name] Marion, MD Primary Care Provider: Biagio Borg, MD Primary Gastroenterologist: None   Reason for Consultation/Chief Complaint: Melena and anemia  Subjective  HPI:  This is a 67 year old woman not previously seen by our practice who was sent to the emergency department after a primary care visit earlier today for severe anemia and concerns of GI bleed.  Over a week ago she had passage of black loose stool and then had another episode of black stool sometime between then and now.  She went to urgent care 2 days ago after an episode of syncope and complaining of chest pressure.  That providers note indicates the patient declined rectal exam, transfusion or admission, preferring to see primary care.  She was seen by primary care earlier today (Dr. Jenny Reichmann), and a stat referral was sent to our clinic at the patient's request.  (Her husband is my patient).  When this message was passed to me, I strongly recommended emergency department evaluation and hospital admission because her hemoglobin was 7.52 days ago and 7.2 earlier today.  [name] [name] is not currently having chest pain or pressure, she is dyspneic with exertion.  No syncope since the urgent care visit 2 days ago.  She denies abdominal pain, vomiting, dysphagia, odynophagia or recent weight loss.  She takes Excedrin twice a day for headaches.  She also reports a history of ulcer disease decades ago and believes she was once treated for H. pylori prior to that.    ROS:  All other systems are negative except as noted above in the HPI  Past Medical History Past Medical History:  Diagnosis Date  . ASTHMA   . BREAST CANCER, HX OF 11/1979  . GERD   . Irritable bowel syndrome   . MIGRAINE HEADACHE     Past Surgical History Past Surgical History:  Procedure  Laterality Date  . ABDOMINAL HYSTERECTOMY  11/1979  . BREAST SURGERY    . Left wrist  1963  . MASTECTOMY  11/1979   Bilaterally & implants  . TONSILLECTOMY  1955  . TUBAL LIGATION  1975    Family History Family History  Problem Relation Age of Onset  . Coronary artery disease Father 39       CABG age 48  . Breast cancer Mother   . Uterine cancer Mother   . Coronary artery disease Mother   . Endometriosis Sister   . Colon polyps Sister 61       1/2 sister - same dad  . Irritable bowel syndrome Sister        1/2 sister, C prone  . Hyperlipidemia Brother        1/2 bro, same dad  . Hypertension Brother        1/2 bro, same dad    Social History Social History   Socioeconomic History  . Marital status: Widowed    Spouse name: Not on file  . Number of children: Not on file  . Years of education: Not on file  . Highest education level: Not on file  Occupational History  . Not on file  Social Needs  . Financial resource strain: Not on file  . Food insecurity:    Worry: Not on file    Inability: Not on file  . Transportation needs:    Medical: Not on file    Non-medical: Not  on file  Tobacco Use  . Smoking status: Never Smoker  . Smokeless tobacco: Never Used  Substance and Sexual Activity  . Alcohol use: Yes    Alcohol/week: 0.0 standard drinks    Comment: occ  . Drug use: No  . Sexual activity: Not on file  Lifestyle  . Physical activity:    Days per week: Not on file    Minutes per session: Not on file  . Stress: Not on file  Relationships  . Social connections:    Talks on phone: Not on file    Gets together: Not on file    Attends religious service: Not on file    Active member of club or organization: Not on file    Attends meetings of clubs or organizations: Not on file    Relationship status: Not on file  Other Topics Concern  . Not on file  Social History Narrative   Married, lives at home with spouse.    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 Albuterol Sulfate Anaphylaxis    Pt states she has tolerated levalbuterol  . Amoxicillin-Pot Clavulanate     REACTION: ANAPHYLACTIC REACTION  Lips swell   . Epipen [Epinephrine] Other (See Comments)    Allergic to preservatives in Epipen with caution!!!  . Fluzone     REACTION: ANAPHYLACTIC  . Influenza Vaccines Anaphylaxis  . Morphine     REACTION: ANAPHYLACTIC REACTION  . Povidone-Iodine     REACTION: HIVES/ANAPHYLACTIC WITH INJECTABLE DYE  . Sulfonamide Derivatives     REACTION: ANAPHYLACTIC REACTION  . Atrovent [Ipratropium] Other (See Comments)    Has sulfa in preservative  . Ciprofloxacin     Itchy palms  . Hyoscyamine Sulfate Hives  . Meperidine Hcl     REACTION: N\T\V  . Other     Mucomist-unable to breathe  . Pepcid [Famotidine] Other (See Comments)  . Pneumococcal Vaccines     Pt declines  . Ppd [Tuberculin Purified Protein Derivative]     Arm swelling with red streaks  . Reglan [Metoclopramide] Other (See Comments)  . Zostavax [Zoster Vaccine Live]     Pt declines    Outpatient Meds Home medications from the H+P and/or nursing med reconciliation reviewed.  Inpatient med list reviewed  _____________________________________________________________________ Objective   Exam:  Current vital signs  Patient Vitals for the past 8 hrs:  BP Temp Temp src Pulse Resp SpO2 Height Weight  08/24/18 1600 - - - - - - 5\' 3"  (1.6 m) 67 kg  08/24/18 1545 - - - - (!) 21 - - -  08/24/18 1530 - - - - 15 - - -  08/24/18 1515 123/66 - - - 17 - - -  08/24/18 1430 (!) 133/96 - - 97 (!) 35 100 % - -  08/24/18 1415 115/62 - - 96 17 100 % - -  08/24/18 1404 - 97.6 F (36.4 C) Oral - - - - -  08/24/18 1401 - - - - - - 5\' 3"  (1.6 m) 67.1 kg  08/24/18 1400 107/64 - - 96 15 100 % - -   No intake or output data in the 24 hours ending 08/24/18 1639  Physical Exam:    General: this is  a pale and otherwise well-appearing patient in no acute distress  Eyes: sclera anicteric, no redness  ENT: oral mucosa moist without lesions, no cervical or supraclavicular  lymphadenopathy, good dentition  CV: RRR without murmur, S1/S2, no JVD,, no peripheral edema.  Extremities warm and well-perfused   resp: clear to auscultation bilaterally, normal RR and effort noted  GI: soft, no tenderness, with active bowel sounds. No guarding or palpable organomegaly noted  Skin; warm and dry, no rash or jaundice noted with + pallor  Neuro: awake, alert and oriented x 3. Normal gross motor function and fluent speech.  Labs:  Recent Labs  Lab 08/22/18 1250 08/24/18 1042  WBC 7.7 5.7  HGB 7.5* 7.2 Repeated and verified X2.*  HCT 24.3* 21.4 Repeated and verified X2.*  PLT 490* 585.0*   Recent Labs  Lab 08/24/18 1042  NA 141  K 3.9  CL 106  CO2 26  BUN 18  ALBUMIN 4.0  ALKPHOS 49  ALT 9  AST 16  GLUCOSE 105*   Recent Labs  Lab 08/24/18 1042  INR 1.0    Radiologic studies: Normal 2 view chest x-ray 08/22/2018  @ASSESSMENTPLANBEGIN @ Impression:  Melena Anemia of acute on chronic GI blood loss.  It is normocytic, implying a likely large component of recent/subacute blood loss more so than chronic.  She was anemic to a lesser degree a year ago, and B12 level was normal at that time.  A low iron saturation at that time, no folic acid level can be found.  Chronic aspirin use and history of ulcer disease.  This patient likely has recurrent ulcer, she was advised to undergo hospital admission, transfusion of at least 1 unit PRBCs, administration of IV iron, and upper endoscopy tomorrow. He was agreeable to an upper endoscopy after thorough discussion of procedure and risks in the presence of her husband, [name] [name], and the admitting hospitalist.  The benefits and risks of the planned procedure were described in detail with the patient or (when appropriate) their health care proxy.   Risks were outlined as including, but not limited to, bleeding, infection, perforation, adverse medication reaction leading to cardiac or pulmonary decompensation, or pancreatitis (if ERCP).  The limitation of incomplete mucosal visualization was also discussed.  No guarantees or warranties were given. Patient at increased risk for cardiopulmonary complications of procedure due to severity of anemia.   She has great concerns about taking almost any medicines because she has a very long list of veggies and intolerances.  Several of those medicines reportedly caused anaphylaxis, including those with sulfa.  She requires acid suppression in the way of proton pump inhibitor, to which she has no known allergies.  She has not had a previous colonoscopy and does not wish to have one.  This may need to be reconsidered if upper endoscopy does not discover the source of this anemia.  Thank you for the courtesy of this consult.  Please contact me with any questions or concerns.  [name] Office: [phone]

## 2018-08-24 NOTE — Telephone Encounter (Signed)
No referral in place.  Will watch for it

## 2018-08-24 NOTE — Assessment & Plan Note (Signed)
With fatigue but no further syncope, due to above,  to f/u any worsening symptoms or concerns

## 2018-08-24 NOTE — Patient Instructions (Addendum)
Please take all new medication as prescribed - the protonix 40 mg per day (or two of the OTC Nexium 22 mg pills per day)  Please continue all other medications as before, and refills have been done if requested.  Please have the pharmacy call with any other refills you may need.  Please keep your appointments with your specialists as you may have planned  You will be contacted regarding the referral for: Gastroenterology - Dr Loletha Carrow (and I have place a direct message to him as well)  Please go to the LAB in the Basement (turn left off the elevator) for the tests to be done today  You will be contacted by phone if any changes need to be made immediately.  Otherwise, you will receive a letter about your results with an explanation, but please check with MyChart first.  Please remember to sign up for MyChart if you have not done so, as this will be important to you in the future with finding out test results, communicating by private email, and scheduling acute appointments online when needed.

## 2018-08-24 NOTE — Telephone Encounter (Signed)
CRITICAL VALUE STICKER  CRITICAL VALUE: Hg 7.2, Hct 21.4  RECEIVER (on-site recipient of call):Tamara,RN  DATE & TIME NOTIFIED: 08/24/18 11:45  MESSENGER (representative from lab):Karen,Lab  MD NOTIFIED: Cathlean Cower, MD  TIME OF NOTIFICATION:08/24/18 11:46  RESPONSE: pending

## 2018-08-24 NOTE — H&P (Signed)
History and Physical    TINITA BROOKER ATF:[id number] DOB: 04-13-1951 DOA: 08/24/2018  PCP: Biagio Borg, MD Consultants:  GI Patient coming from: home- lives with husband  Chief Complaint: SOB  HPI: DIANELYS SCINTO is a 67 y.o. female with medical history significant for breast cancer status post mastectomy, GERD, PUD who presented to the ED today with c/o symptomatic anemia. Hgb 7.2.  Patient states that she had some vague chest pain as well as palpitations and shortness of breath this past Friday.  Dyspnea has been progressive and she is developed increased weakness and fatigue.  She also had a presyncopal episode along with chest pressure for which she presented to [facility] 2 days ago. Her hemoglobin was noted to be 7.5.  She refused admission and blood transfusion at that time.  Her PCP tried to get her in as an urgent outpatient referral to Dr. Loletha Carrow with [facility] today but he instead suggested strongly that the patient come to the ED to be admitted.  She did have 1 black tarry stool yesterday.  No nausea or vomiting.  She says that she takes no aspirin or NSAIDs but then said that she took Excedrin twice daily every day.  Hemoglobin several weeks ago was in the 10-11 range.  She reports that she had 2 previous ulcers both around 25 years ago but she did not require transfusions as her hemoglobin was above 8 both times.  She has been treated for H pylori she believes close to 30 years ago.  She has never had a blood transfusion.  She drinks occasional alcohol, no history of heavy use.  No tobacco or illicit drugs.  Of note, she has great concerns about taking almost any medicines because she has a very long list of allergies and intolerances.  Several of those medicines reportedly caused anaphylaxis, including those with sulfa/sulfate moieties.   ED Course: Blood pressure was 107/64, heart rate 96.  100% on room air.  Hemoglobin was 7.2.  Review of Systems: As per HPI; otherwise review of  systems reviewed and negative.   Ambulatory Status:  Ambulates without assistance  Past Medical History:  Diagnosis Date  . ASTHMA   . BREAST CANCER, HX OF 11/1979  . GERD   . Irritable bowel syndrome   . MIGRAINE HEADACHE     Past Surgical History:  Procedure Laterality Date  . ABDOMINAL HYSTERECTOMY  11/1979  . BREAST SURGERY    . Left wrist  1963  . MASTECTOMY  11/1979   Bilaterally & implants  . TONSILLECTOMY  1955  . TUBAL LIGATION  1975    Social History   Socioeconomic History  . Marital status: Widowed    Spouse name: Not on file  . Number of children: Not on file  . Years of education: Not on file  . Highest education level: Not on file  Occupational History  . Not on file  Social Needs  . Financial resource strain: Not on file  . Food insecurity:    Worry: Not on file    Inability: Not on file  . Transportation needs:    Medical: Not on file    Non-medical: Not on file  Tobacco Use  . Smoking status: Never Smoker  . Smokeless tobacco: Never Used  Substance and Sexual Activity  . Alcohol use: Yes    Alcohol/week: 0.0 standard drinks    Comment: occ  . Drug use: No  . Sexual activity: Not on file  Lifestyle  .  Physical activity:    Days per week: Not on file    Minutes per session: Not on file  . Stress: Not on file  Relationships  . Social connections:    Talks on phone: Not on file    Gets together: Not on file    Attends religious service: Not on file    Active member of club or organization: Not on file    Attends meetings of clubs or organizations: Not on file    Relationship status: Not on file  . Intimate partner violence:    Fear of current or ex partner: Not on file    Emotionally abused: Not on file    Physically abused: Not on file    Forced sexual activity: Not on file  Other Topics Concern  . Not on file  Social History Narrative   Married, lives at home with spouse.    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 Albuterol Sulfate Anaphylaxis    Pt states she has tolerated levalbuterol  . Amoxicillin-Pot Clavulanate     REACTION: ANAPHYLACTIC REACTION  Lips swell   . Epipen [Epinephrine] Other (See Comments)    Allergic to preservatives in Epipen with caution!!!  . Fluzone     REACTION: ANAPHYLACTIC  . Influenza Vaccines Anaphylaxis  . Morphine     REACTION: ANAPHYLACTIC REACTION  . Povidone-Iodine     REACTION: HIVES/ANAPHYLACTIC WITH INJECTABLE DYE  . Sulfonamide Derivatives     REACTION: ANAPHYLACTIC REACTION  . Atrovent [Ipratropium] Other (See Comments)    Has sulfa in preservative  . Ciprofloxacin     Itchy palms  . Hyoscyamine Sulfate Hives  . Meperidine Hcl     REACTION: N\T\V  . Other     Mucomist-unable to breathe  . Pepcid [Famotidine] Other (See Comments)  . Pneumococcal Vaccines     Pt declines  . Ppd [Tuberculin Purified Protein Derivative]     Arm swelling with red streaks  . Reglan [Metoclopramide] Other (See Comments)  . Zostavax [Zoster Vaccine Live]     Pt declines    Family History  Problem Relation Age of Onset  . Coronary artery disease Father 1       CABG age 88  . Breast cancer Mother   . Uterine cancer Mother   . Coronary artery disease Mother   . Endometriosis Sister   . Colon polyps Sister 9       1/2 sister - same dad  . Irritable bowel syndrome Sister        1/2 sister, C prone  . Hyperlipidemia Brother        1/2 bro, same dad  . Hypertension Brother        1/2 bro, same dad    Prior to Admission medications   Medication Sig Start Date End Date Taking? Authorizing Provider  Ascorbic Acid (VITAMIN C) 1000 MG tablet Take 1,000 mg by mouth daily.    [provider]  aspirin-acetaminophen-caffeine (EXCEDRIN MIGRAINE) [phone] MG per tablet Take 1 tablet by mouth every 6 (six) hours as needed. For migraine    [provider]  Calcium Carbonate-Vit D-Min  600-200 MG-UNIT TABS Take 1 tablet by mouth 2 (two) times daily.    [provider]  Cyanocobalamin (VITAMIN B 12 PO) Take 1 tablet by mouth daily.    [provider]  cyclobenzaprine (FLEXERIL) 10 MG tablet Take 1  tablet (10 mg total) by mouth 3 (three) times daily. 06/22/18   Biagio Borg, MD  diphenhydrAMINE (BENADRYL) 25 MG tablet Take 25 mg by mouth every 8 (eight) hours as needed. For allergies    [provider]  EPINEPHrine (EPIPEN 2-PAK) 0.3 mg/0.3 mL IJ SOAJ injection Inject 0.3 mLs (0.3 mg total) into the muscle once for 1 dose. 09/13/17 09/13/17  Deneise Lever, MD  levalbuterol [facility] HFA) 45 MCG/ACT inhaler Inhale 1-2 puffs into the lungs every 6 (six) hours as needed. For shortness of breath 04/28/18   Baird Lyons D, MD  levalbuterol Penne Lash) 1.25 MG/3ML nebulizer solution Take 1.25 mg by nebulization every 4 (four) hours as needed for wheezing. 09/11/16   Baird Lyons D, MD  Melatonin 10 MG CAPS Take 10 mg by mouth at bedtime.    [provider]  mometasone-formoterol (DULERA) 200-5 MCG/ACT AERO Inhale 2 puffs into the lungs 2 (two) times daily. Rinse mouth 08/23/13   Rowe Clack, MD  Multiple Vitamin (MULTIVITAMIN) tablet Take 1 tablet by mouth daily.      [provider]  pantoprazole (PROTONIX) 40 MG tablet Take 1 tablet (40 mg total) by mouth daily. 08/24/18   Biagio Borg, MD  vitamin E 400 UNIT capsule Take 400 Units by mouth daily.      [provider]  zolpidem (AMBIEN) 10 MG tablet TAKE 1 TABLET BY MOUTH AT BEDTIME 01/24/18   Biagio Borg, MD    Physical Exam: Vitals:   08/24/18 1400 08/24/18 1401 08/24/18 1404  BP: 107/64    Pulse: 96    Resp: 15    Temp:   97.6 F (36.4 C)  TempSrc:   Oral  SpO2: 100%    Weight:  67.1 kg   Height:  5\' 3"  (1.6 m)      . General: Appears calm and comfortable and is in NAD . Eyes:  PERRL, EOMI, normal lids, iris. Pale conjunctiva . ENT:  grossly normal  hearing, lips & tongue, mmm . Neck:  supple, no lymphadenopathy . Cardiovascular:  nL S1, S2, normal rate, reg rhythm, no murmur. Marland Kitchen Respiratory:   CTA bilaterally with no wheezes/rales/rhonchi.  Normal respiratory effort. . Abdomen:  soft, NT, ND, NABS . Back:   grossly normal alignment . Skin:  no rash or lesions seen on limited exam . Musculoskeletal:  grossly normal tone BUE/BLE, good ROM, no bony abnormality or obvious joint deformity . Lower extremities:  No LE edema.  Limited foot exam with no ulcerations.  2+ distal pulses. Marland Kitchen Psychiatric:  grossly normal mood and affect, speech fluent and appropriate, AOx3 . Neurologic:  CN 2-12 grossly intact, moves all extremities in coordinated fashion, sensation intact, Patellar DTRs 2+ and symmetric   Radiological Exams on Admission: No results found.  EKG: none   Labs on Admission: I have personally reviewed the available labs and imaging studies at the time of the admission.  Pertinent labs:  Creat 1.19 (baseline 1-1.1) LFTs WNL Hgb 7.2, MCV 84.5 WBC 5.7 Plt 585   Assessment/Plan Principal Problem:   Symptomatic anemia Active Problems:   Non-allergic asthma   GERD   Iron deficiency anemia   GI bleed    Symptomatic anemia, Hgb 7.2 Baseline appears to be around 10-11. With recent h/o melena and remote h/o PUD and H. Pylori, most likely source is upper GIB, likely 2/2 PUD. Also on ddx: gastritis, esophageal varices (unlikely with no h/o liver dz), AVMs, malignancy (also unlikely). No  h/o vomiting so doubt MW tear. -GI on board, appreciate assistance -transfuse 1 unit prbc -post-transfusion CBC, repeat 1 unit prbc if <7u -clear liquids now, NPO after MN for EGD -protonix bolus and gtt -pt takes B12, will check folate -have instructed pt to avoid Excedrin and other ASA containing products  GERD -PPI as above  Non-allergic asthma; currently stable -cont home Dulera, Xopenex prn  Osteopenia -cont Ca-vitD    DVT  prophylaxis: SCDs, avoid heparin Code Status:  Full - confirmed with patient/family Family Communication: husband at bedside  Disposition Plan:  Home once clinically improved Consults called: GI  Admission status: Admit - It is my clinical opinion that admission to INPATIENT is reasonable and necessary because of the expectation that this patient will require hospital care that crosses at least 2 midnights to treat this condition based on the medical complexity of the problems presented.  Given the aforementioned information, the predictability of an adverse outcome is felt to be significant.     Janora Norlander MD [facility]  If note is complete, please contact covering daytime or nighttime physician. [email or web address] Password [email or web address]  08/24/2018, 2:16 PM

## 2018-08-24 NOTE — ED Notes (Signed)
[facility] - Azucena Freed paged to Dr. Regenia Skeeter to [phone] at 1408.

## 2018-08-24 NOTE — Consult Note (Signed)
NAME:  [name] [name], MRN:  [id number], DOB:  04/15/1951, LOS: 0 ADMISSION DATE:  08/24/2018, CONSULTATION DATE:  08/24/18 REFERRING MD:  Silverio Lay MD, CHIEF COMPLAINT:  Anaphylaxis  Brief History   67 year old with symptomatic anemia, black tarry stools.  Admitted for evaluation of GI bleed.  Developed allergic/anaphylactic reaction while getting a blood transfusion.  Transferred to ICU after CODE BLUE called on the floor.  Patient developed, throat itching, flushing, dyspnea with wheezing 10 to 15 minutes into blood transfusion reaction.  Given 125 mg Solu-Medrol and 50 mg of Benadryl.  Unable to give epinephrine or Pepcid due to previously recorded allergies.  Patient refused to get intubated. Transferred to ICU for closer monitoring.  Past Medical History  Asthma, breast cancer, GERD, irritable bowel syndrome, migraine, breast cancer with bilateral mastectomies  Significant Hospital Events   12/11- Admit. Developed anaphylatic reaction during blood transfusion  Consults:  GI 12/11 PCCM 12/11  Procedures:    Significant Diagnostic Tests:    Micro Data:    Antimicrobials:    Interim history/subjective:    Objective   Blood pressure 115/62, pulse 87, temperature 98.2 F (36.8 C), temperature source Oral, resp. rate 16, height 5\' 3"  (1.6 m), weight 67 kg, SpO2 100 %.    FiO2 (%):  [100 %] 100 %   Intake/Output Summary (Last 24 hours) at 08/24/2018 1814 Last data filed at 08/24/2018 1655 Gross per 24 hour  Intake 30 ml  Output -  Net 30 ml   Filed Weights   08/24/18 1401 08/24/18 1600  Weight: 67.1 kg 67 kg    Examination: Gen:      Moderate respiratory distress HEENT:  EOMI, sclera anicteric.  No tongue, lip swelling.  No stridor noted. Neck:     Flushed face, neck and arms. Lungs:    Very poor air entry, tight expiratory wheeze. CV:         Regular rate and rhythm; no murmurs Abd:      + bowel sounds; soft, non-tender; no palpable masses, no  distension Ext:    No edema; adequate peripheral perfusion Skin:      Warm and dry Neuro: Awake, responsive.  Resolved Hospital Problem list     Assessment & Plan:  67 year old with allergic/anaphylactic reaction while getting blood transfusion She is significantly better after getting Xopenex nebs, Solu-Medrol and Benadryl I feel this is more like allergic/bronchospastic reaction (h/o asthma)rather than anaphylaxis as her blood pressure is stable with no lip, throat swelling or stridor Transfer to the ICU for observation and close monitoring  Allergic/anaphylactic reaction Continue Solu-Medrol 80 mg every 8 Benadryl 25 mg every 6 Avoiding Pepcid due to documented allergy Continue Xopenex nebs. Observe in ICU  Anemia, possible GI bleed Hold further blood transfusion Will work with blood bank to work-up transfusion reaction Monitor CBC GI on board   Best practice:  Diet: N.p.o. Pain/Anxiety/Delirium protocol (if indicated): NA VAP protocol (if indicated): NA DVT prophylaxis: SCDs GI prophylaxis: NA Mobility: bedrest Code Status: Full code Family Communication: Pt and husband updated Disposition:   Labs   CBC: Recent Labs  Lab 08/22/18 1250 08/24/18 1042  WBC 7.7 5.7  NEUTROABS  --  2.9  HGB 7.5* 7.2 Repeated and verified X2.*  HCT 24.3* 21.4 Repeated and verified X2.*  MCV 89.3 84.5  PLT 490* 585.0*    Basic Metabolic Panel: Recent Labs  Lab 08/22/18 1250 08/24/18 1042  NA 139 141  K 3.3* 3.9  CL 105 106  CO2 24 26  GLUCOSE 105* 105*  BUN 23 18  CREATININE 1.04* 1.19  CALCIUM 9.3 9.5   GFR: Estimated Creatinine Clearance: 41 mL/min (by C-G formula based on SCr of 1.19 mg/dL). Recent Labs  Lab 08/22/18 1250 08/24/18 1042  WBC 7.7 5.7    Liver Function Tests: Recent Labs  Lab 08/24/18 1042  AST 16  ALT 9  ALKPHOS 49  BILITOT 0.3  PROT 6.6  ALBUMIN 4.0   No results for input(s): LIPASE, AMYLASE in the last 168 hours. No results for  input(s): AMMONIA in the last 168 hours.  ABG No results found for: PHART, PCO2ART, PO2ART, HCO3, TCO2, ACIDBASEDEF, O2SAT   Coagulation Profile: Recent Labs  Lab 08/24/18 1042  INR 1.0    Cardiac Enzymes: Recent Labs  Lab 08/22/18 1250  TROPONINI <0.03    HbA1C: No results found for: HGBA1C  CBG: No results for input(s): GLUCAP in the last 168 hours.  Review of Systems:   REVIEW OF SYSTEMS:   All negative; except for those that are bolded, which indicate positives.  Constitutional: weight loss, weight gain, night sweats, fevers, chills, fatigue, weakness.  HEENT: headaches, sore throat, sneezing, nasal congestion, post nasal drip, difficulty swallowing, tooth/dental problems, visual complaints, visual changes, ear aches. Neuro: difficulty with speech, weakness, numbness, ataxia. CV:  chest pain, orthopnea, PND, swelling in lower extremities, dizziness, palpitations, syncope.  Resp: cough, hemoptysis, dyspnea, wheezing. GI: heartburn, indigestion, abdominal pain, nausea, vomiting, diarrhea, constipation, change in bowel habits, loss of appetite, hematemesis, melena, hematochezia.  GU: dysuria, change in color of urine, urgency or frequency, flank pain, hematuria. MSK: joint pain or swelling, decreased range of motion. Psych: change in mood or affect, depression, anxiety, suicidal ideations, homicidal ideations. Skin: rash, itching, bruising.  Past Medical History  She,  has a past medical history of ASTHMA, BREAST CANCER, HX OF (11/1979), GERD, Irritable bowel syndrome, MIGRAINE HEADACHE, and Symptomatic anemia (08/24/2018).   Surgical History    Past Surgical History:  Procedure Laterality Date  . ABDOMINAL HYSTERECTOMY  11/1979  . BREAST SURGERY    . Left wrist  1963  . MASTECTOMY  11/1979   Bilaterally & implants  . TONSILLECTOMY  1955  . TUBAL LIGATION  1975     Social History   reports that she has never smoked. She has never used smokeless tobacco. She  reports that she drinks alcohol. She reports that she does not use drugs.   Family History   Her family history includes Breast cancer in her mother; Colon polyps (age of onset: 73) in her sister; Coronary artery disease in her mother; Coronary artery disease (age of onset: 45) in her father; Endometriosis in her sister; Hyperlipidemia in her brother; Hypertension in her brother; Irritable bowel syndrome in her sister; Uterine cancer in her mother.   Allergies Allergies  Allergen Reactions  . Albuterol Sulfate Anaphylaxis    Pt states she has tolerated levalbuterol  . Amoxicillin-Pot Clavulanate     REACTION: ANAPHYLACTIC REACTION  Lips swell   . Epipen [Epinephrine] Other (See Comments)    Allergic to preservatives in Epipen with caution!!!  . Fluzone     REACTION: ANAPHYLACTIC  . Influenza Vaccines Anaphylaxis  . Morphine     REACTION: ANAPHYLACTIC REACTION  . Povidone-Iodine     REACTION: HIVES/ANAPHYLACTIC WITH INJECTABLE DYE  . Sulfonamide Derivatives     REACTION: ANAPHYLACTIC REACTION  . Atrovent [Ipratropium] Other (See Comments)    Has sulfa in preservative  . Ciprofloxacin  Itchy palms  . Hyoscyamine Sulfate Hives  . Meperidine Hcl     REACTION: N\T\V  . Other     Mucomist-unable to breathe  . Pepcid [Famotidine] Other (See Comments)  . Pneumococcal Vaccines     Pt declines  . Ppd [Tuberculin Purified Protein Derivative]     Arm swelling with red streaks  . Reglan [Metoclopramide] Other (See Comments)  . Zostavax [Zoster Vaccine Live]     Pt declines     Home Medications  Prior to Admission medications   Medication Sig Start Date End Date Taking? Authorizing Provider  Ascorbic Acid (VITAMIN C) 1000 MG tablet Take 1,000 mg by mouth daily.   Yes [provider]  aspirin-acetaminophen-caffeine (EXCEDRIN MIGRAINE) [phone] MG per tablet Take 1 tablet by mouth every 6 (six) hours as needed. For migraine   Yes [provider]  Calcium  Carbonate-Vit D-Min 600-200 MG-UNIT TABS Take 1 tablet by mouth 2 (two) times daily.   Yes [provider]  Cyanocobalamin (VITAMIN B 12 PO) Take 1 tablet by mouth daily.   Yes [provider]  cyclobenzaprine (FLEXERIL) 10 MG tablet Take 1 tablet (10 mg total) by mouth 3 (three) times daily. 06/22/18  Yes Biagio Borg, MD  diphenhydrAMINE (BENADRYL) 25 MG tablet Take 25 mg by mouth every 8 (eight) hours as needed. For allergies   Yes [provider]  levalbuterol (XOPENEX HFA) 45 MCG/ACT inhaler Inhale 1-2 puffs into the lungs every 6 (six) hours as needed. For shortness of breath 04/28/18  Yes Young, Tarri Fuller D, MD  levalbuterol Penne Lash) 1.25 MG/3ML nebulizer solution Take 1.25 mg by nebulization every 4 (four) hours as needed for wheezing. 09/11/16  Yes Young, Tarri Fuller D, MD  Melatonin 10 MG CAPS Take 10 mg by mouth at bedtime.   Yes [provider]  mometasone-formoterol (DULERA) 200-5 MCG/ACT AERO Inhale 2 puffs into the lungs 2 (two) times daily. Rinse mouth 08/23/13  Yes Rowe Clack, MD  pantoprazole (PROTONIX) 40 MG tablet Take 1 tablet (40 mg total) by mouth daily. 08/24/18  Yes Biagio Borg, MD  vitamin E 400 UNIT capsule Take 400 Units by mouth daily.     Yes [provider]  zolpidem (AMBIEN) 10 MG tablet TAKE 1 TABLET BY MOUTH AT BEDTIME Patient taking differently: Take 10 mg by mouth at bedtime.  01/24/18  Yes Biagio Borg, MD    The patient is critically ill with multiple organ system failure and requires high complexity decision making for assessment and support, frequent evaluation and titration of therapies, advanced monitoring, review of radiographic studies and interpretation of complex data.   Critical Care Time devoted to patient care services, exclusive of separately billable procedures, described in this note is  35 minutes.   Marshell Garfinkel MD [facility] Pager [phone] If no answer call:  [phone] 08/24/2018, 7:17 PM

## 2018-08-24 NOTE — Progress Notes (Signed)
Chaplain was paged to a Code Blue when PT was on 40M.  Chaplain arrived and PT was being cared for.  PT's husband on the couch and Chaplain interacted and sat with the husband.  PT was transferred to 79M and Chaplain escorted husband to the waiting area provided support as the PT was being settled.  Husband provided life review stating this is a second marriage for both as both their spouses died of cancer and then they met.  Family appreciated the support and Chaplain escorted husband to the bedside.   Chaplain Katherene Ponto

## 2018-08-24 NOTE — Assessment & Plan Note (Signed)
Persistent, suspect mild worsening, refused ED visit

## 2018-08-24 NOTE — Progress Notes (Signed)
Patient stated itching at the site and started scratching her forearm, hand, chest, and also stated she was having itchy tongue and throat, stopped the blood at 1708, flushed with 250 ml of NS, took VS which are documented, couldn't take her oral temp as she was having bad spells of cough and was restless. Called Dr. Royal Piedra, rapid response, and charge nurse.  paged respiratory also.  Dr. Steffanie Dunn  came and checked the pt and put orders for Benadryl and Solu Medrol, given to the patient. Code initiated to get the anesthesiology bedside. Code team came, checked her, wanted to intubate her but pt refused, didn't want also epinephrine as pt is allergic.  Respiratory gave Xopenex breathing tx which calmed her a little.  Dr. Vaughan Browner wanted to transfer her to 74M, placed bed request and pt transferred to 74M02 for more closer observation.  Gave report to the nurse on 74M and answered all the questions.  Called Blood bank and filled the form and sent the blood back to them.

## 2018-08-24 NOTE — Telephone Encounter (Signed)
Shirron, please to let pt know that Hgb is 7.2, and I am directing her to go to the ED for further evaluation most likely as an inpatient, ( even though the pt have stated she did not want to do this).     Dr Loletha Carrow has replied and strongly encouraged this as well, since this is not an appropriate case for outpatient management.

## 2018-08-24 NOTE — Progress Notes (Signed)
CRITICAL VALUE ALERT  Critical Value:  Lactic acid= 3.7  Date & Time Notied:  08/24/18 1849  Provider Notified: Mannam  Orders Received/Actions taken: See new orders

## 2018-08-24 NOTE — Progress Notes (Signed)
Admission note:  Arrival Method: Patient arrived from ED in w/c.   Mental Orientation:  Alert and oriented x 4. Telemetry: N/A Assessment: See doc flow sheets. Skin: Intact. IV: Right Forearm. Pain: denies any pain. Tubes: N/A Safety Measures: Bed in low position, call bell and phone within reach. Fall Prevention Safety Plan: reviewed the plan with patient, verbalized understanding, but denied bed alarm. Admission Screening: In process. 6700 Orientation: Patient has been oriented to the unit, staff and to the room.

## 2018-08-24 NOTE — Progress Notes (Signed)
Subjective:    Patient ID: [name] [name], female    DOB: 1951/06/02, 67 y.o.   MRN: [id number]  HPI  Here to f/u recent syncope dec 9 after onset epigastric pain and black stools since Thanksgiving day.  Denies worsening reflux, dysphagia, n/v, bowel change , but has epigastric pain, dull, without radiation, but with some nausea (no vomiting), no fever.  Has hx of remote PUD with bleed 26 yrs ago.  Had one black stool yesterday, none today so far.  Remains very fatigued and with sob/doe, but no CP, diaphoresis, or palpitations. Eval Dec 9 was c/w Hgb 7.5, advised for inpatient management but declined.  Pt denies new neurological symptoms such as new headache, or facial or extremity weakness or numbness   Pt denies polydipsia, polyuriad Past Medical History:  Diagnosis Date  . ASTHMA   . BREAST CANCER, HX OF 11/1979  . GERD   . Irritable bowel syndrome   . MIGRAINE HEADACHE   . Symptomatic anemia 08/24/2018   Past Surgical History:  Procedure Laterality Date  . ABDOMINAL HYSTERECTOMY  11/1979  . BREAST SURGERY    . Left wrist  1963  . MASTECTOMY  11/1979   Bilaterally & implants  . TONSILLECTOMY  1955  . TUBAL LIGATION  1975    reports that she has never smoked. She has never used smokeless tobacco. She reports that she drinks alcohol. She reports that she does not use drugs. family history includes Breast cancer in her mother; Colon polyps (age of onset: 33) in her sister; Coronary artery disease in her mother; Coronary artery disease (age of onset: 45) in her father; Endometriosis in her sister; Hyperlipidemia in her brother; Hypertension in her brother; Irritable bowel syndrome in her sister; Uterine cancer in her mother. Allergies  Allergen Reactions  . Albuterol Sulfate Anaphylaxis    Pt states she has tolerated levalbuterol  . Amoxicillin-Pot Clavulanate     REACTION: ANAPHYLACTIC REACTION  Lips swell   . Epipen [Epinephrine] Other (See Comments)    Allergic to preservatives  in Epipen with caution!!!  . Fluzone     REACTION: ANAPHYLACTIC  . Influenza Vaccines Anaphylaxis  . Morphine     REACTION: ANAPHYLACTIC REACTION  . Povidone-Iodine     REACTION: HIVES/ANAPHYLACTIC WITH INJECTABLE DYE  . Sulfonamide Derivatives     REACTION: ANAPHYLACTIC REACTION  . Atrovent [Ipratropium] Other (See Comments)    Has sulfa in preservative  . Ciprofloxacin     Itchy palms  . Hyoscyamine Sulfate Hives  . Meperidine Hcl     REACTION: N\T\V  . Other     Mucomist-unable to breathe  . Pepcid [Famotidine] Other (See Comments)  . Pneumococcal Vaccines     Pt declines  . Ppd [Tuberculin Purified Protein Derivative]     Arm swelling with red streaks  . Reglan [Metoclopramide] Other (See Comments)  . Zostavax [Zoster Vaccine Live]     Pt declines   No current facility-administered medications on file prior to visit.    Current Outpatient Medications on File Prior to Visit  Medication Sig Dispense Refill  . Ascorbic Acid (VITAMIN C) 1000 MG tablet Take 1,000 mg by mouth daily.    Marland Kitchen aspirin-acetaminophen-caffeine (EXCEDRIN MIGRAINE) 250-250-65 MG per tablet Take 1 tablet by mouth every 6 (six) hours as needed. For migraine    . Calcium Carbonate-Vit D-Min 600-200 MG-UNIT TABS Take 1 tablet by mouth 2 (two) times daily.    . Cyanocobalamin (VITAMIN B 12 PO) Take 1  tablet by mouth daily.    . cyclobenzaprine (FLEXERIL) 10 MG tablet Take 1 tablet (10 mg total) by mouth 3 (three) times daily. 60 tablet 1  . diphenhydrAMINE (BENADRYL) 25 MG tablet Take 25 mg by mouth every 8 (eight) hours as needed. For allergies    . levalbuterol (XOPENEX HFA) 45 MCG/ACT inhaler Inhale 1-2 puffs into the lungs every 6 (six) hours as needed. For shortness of breath 1 Inhaler 2  . levalbuterol (XOPENEX) 1.25 MG/3ML nebulizer solution Take 1.25 mg by nebulization every 4 (four) hours as needed for wheezing. 72 mL 12  . Melatonin 10 MG CAPS Take 10 mg by mouth at bedtime.    .  mometasone-formoterol (DULERA) 200-5 MCG/ACT AERO Inhale 2 puffs into the lungs 2 (two) times daily. Rinse mouth 1 Inhaler 2  . vitamin E 400 UNIT capsule Take 400 Units by mouth daily.      Marland Kitchen zolpidem (AMBIEN) 10 MG tablet TAKE 1 TABLET BY MOUTH AT BEDTIME (Patient taking differently: Take 10 mg by mouth at bedtime. ) 90 tablet 1   Review of Systems  Constitutional: Negative for other unusual diaphoresis or sweats HENT: Negative for ear discharge or swelling Eyes: Negative for other worsening visual disturbances Respiratory: Negative for stridor or other swelling  Gastrointestinal: Negative for worsening distension or other blood Genitourinary: Negative for retention or other urinary change Musculoskeletal: Negative for other MSK pain or swelling Skin: Negative for color change or other new lesions Neurological: Negative for worsening tremors and other numbness  Psychiatric/Behavioral: Negative for worsening agitation or other fatigue All other system neg per pt    Objective:   Physical Exam BP 116/68   Pulse 96   Temp 98 F (36.7 C) (Oral)   Ht 5\' 3"  (1.6 m)   Wt 148 lb (67.1 kg)   SpO2 95%   BMI 26.22 kg/m  VS noted,  Constitutional: Pt appears in NAD HENT: Head: NCAT.  Right Ear: External ear normal.  Left Ear: External ear normal.  Eyes: . Pupils are equal, round, and reactive to light. Conjunctivae and EOM are normal Nose: without d/c or deformity Neck: Neck supple. Gross normal ROM Cardiovascular: Normal rate and regular rhythm.   Pulmonary/Chest: Effort normal and breath sounds without rales or wheezing.  Abd:  Soft, NT, ND, + BS, no organomegaly - benign exam Neurological: Pt is alert. At baseline orientation, motor grossly intact Skin: Skin is warm. No rashes, other new lesions, no LE edema Psychiatric: Pt behavior is normal without agitation  No other exam findings Lab Results  Component Value Date   WBC 5.7 08/24/2018   HGB 7.2 Repeated and verified X2. (LL)  08/24/2018   HCT 21.4 Repeated and verified X2. (LL) 08/24/2018   PLT 585.0 (H) 08/24/2018   GLUCOSE 136 (H) 08/24/2018   CHOL 199 02/08/2018   TRIG 220.0 (H) 02/08/2018   HDL 62.00 02/08/2018   LDLDIRECT 72.0 02/08/2018   LDLCALC 133 (H) 02/03/2017   ALT 14 08/24/2018   AST 21 08/24/2018   NA 143 08/24/2018   K 3.3 (L) 08/24/2018   CL 109 08/24/2018   CREATININE 1.21 (H) 08/24/2018   BUN 15 08/24/2018   CO2 19 (L) 08/24/2018   TSH 3.86 02/08/2018   INR 1.0 08/24/2018       Assessment & Plan:

## 2018-08-24 NOTE — ED Provider Notes (Signed)
[facility] EMERGENCY DEPARTMENT Provider Note   CSN: [id number] Arrival date & time: 08/24/18  1348     History   Chief Complaint Chief Complaint  Patient presents with  . Low Hemoglobin    HPI [name] [name] is a 67 y.o. female.  HPI  67 year old female presents from her doctor's office for low hemoglobin.  Hemoglobin was checked today and is 7.2.  She has been feeling weak, fatigued and short of breath progressively for a couple weeks.  She had an episode of abdominal pain Thanksgiving evening but none since.  Over the weekend she had an syncope episode with shortness of breath and chest tightness.  Has been having some GERD.  Was in the [facility] emergency department and was offered admission but declined for symptomatic anemia.  Went to her doctor's office today and PCP called Dr. Loletha Carrow, who advised the patient to be admitted to the hospital for GI work-up.  She states she has a history of gastric ulcer about 25 years ago.  She denies any NSAID or aspirin use.  She drinks alcohol but only occasionally.  No current abdominal pain but she did have a black and tarry stool yesterday.  Past Medical History:  Diagnosis Date  . ASTHMA   . BREAST CANCER, HX OF 11/1979  . GERD   . Irritable bowel syndrome   . MIGRAINE HEADACHE     Patient Active Problem List   Diagnosis Date Noted  . Acute epigastric pain 08/24/2018  . Symptomatic anemia 08/24/2018  . Syncope 08/24/2018  . Iron deficiency anemia 02/04/2018  . Preventative health care 02/03/2017  . Osteopenia 02/03/2017  . Hypertriglyceridemia 08/22/2015  . H/O TB skin testing 12/03/2012  . Anaphylactic reaction   . Thrombocytosis ([place]) 01/19/2011  . MIGRAINE HEADACHE 08/10/2008  . Non-allergic asthma 08/10/2008  . GERD 08/10/2008  . Irritable bowel syndrome 08/10/2008  . Headache(784.0) 08/10/2008  . BREAST CANCER, HX OF 08/10/2008    Past Surgical History:  Procedure Laterality Date  .  ABDOMINAL HYSTERECTOMY  11/1979  . BREAST SURGERY    . Left wrist  1963  . MASTECTOMY  11/1979   Bilaterally & implants  . TONSILLECTOMY  1955  . TUBAL LIGATION  1975     OB History   None      Home Medications    Prior to Admission medications   Medication Sig Start Date End Date Taking? Authorizing Provider  Ascorbic Acid (VITAMIN C) 1000 MG tablet Take 1,000 mg by mouth daily.    [provider]  aspirin-acetaminophen-caffeine (EXCEDRIN MIGRAINE) [phone] MG per tablet Take 1 tablet by mouth every 6 (six) hours as needed. For migraine    [provider]  Calcium Carbonate-Vit D-Min 600-200 MG-UNIT TABS Take 1 tablet by mouth 2 (two) times daily.    [provider]  Cyanocobalamin (VITAMIN B 12 PO) Take 1 tablet by mouth daily.    [provider]  cyclobenzaprine (FLEXERIL) 10 MG tablet Take 1 tablet (10 mg total) by mouth 3 (three) times daily. 06/22/18   Biagio Borg, MD  diphenhydrAMINE (BENADRYL) 25 MG tablet Take 25 mg by mouth every 8 (eight) hours as needed. For allergies    [provider]  EPINEPHrine (EPIPEN 2-PAK) 0.3 mg/0.3 mL IJ SOAJ injection Inject 0.3 mLs (0.3 mg total) into the muscle once for 1 dose. 09/13/17 09/13/17  Deneise Lever, MD  levalbuterol [facility] HFA) 45 MCG/ACT inhaler Inhale 1-2 puffs into  the lungs every 6 (six) hours as needed. For shortness of breath 04/28/18   Baird Lyons D, MD  levalbuterol Penne Lash) 1.25 MG/3ML nebulizer solution Take 1.25 mg by nebulization every 4 (four) hours as needed for wheezing. 09/11/16   Baird Lyons D, MD  Melatonin 10 MG CAPS Take 10 mg by mouth at bedtime.    [provider]  mometasone-formoterol (DULERA) 200-5 MCG/ACT AERO Inhale 2 puffs into the lungs 2 (two) times daily. Rinse mouth 08/23/13   Rowe Clack, MD  Multiple Vitamin (MULTIVITAMIN) tablet Take 1 tablet by mouth daily.      [provider]  pantoprazole (PROTONIX) 40 MG tablet  Take 1 tablet (40 mg total) by mouth daily. 08/24/18   Biagio Borg, MD  vitamin E 400 UNIT capsule Take 400 Units by mouth daily.      [provider]  zolpidem (AMBIEN) 10 MG tablet TAKE 1 TABLET BY MOUTH AT BEDTIME 01/24/18   Biagio Borg, MD    Family History Family History  Problem Relation Age of Onset  . Coronary artery disease Father 16       CABG age 45  . Breast cancer Mother   . Uterine cancer Mother   . Coronary artery disease Mother   . Endometriosis Sister   . Colon polyps Sister 109       1/2 sister - same dad  . Irritable bowel syndrome Sister        1/2 sister, C prone  . Hyperlipidemia Brother        1/2 bro, same dad  . Hypertension Brother        1/2 bro, same dad    Social History Social History   Tobacco Use  . Smoking status: Never Smoker  . Smokeless tobacco: Never Used  Substance Use Topics  . Alcohol use: Yes    Alcohol/week: 0.0 standard drinks    Comment: occ  . Drug use: No     Allergies   Albuterol sulfate; Amoxicillin-pot clavulanate; Epipen [epinephrine]; Fluzone; Influenza vaccines; Morphine; Povidone-iodine; Sulfonamide derivatives; Atrovent [ipratropium]; Ciprofloxacin; Hyoscyamine sulfate; Meperidine hcl; Other; Pepcid [famotidine]; Pneumococcal vaccines; Ppd [tuberculin purified protein derivative]; Reglan [metoclopramide]; and Zostavax [zoster vaccine live]   Review of Systems Review of Systems  Constitutional: Positive for fatigue.  Respiratory: Positive for shortness of breath.   Cardiovascular: Negative for chest pain.  Gastrointestinal: Negative for abdominal pain and vomiting.  All other systems reviewed and are negative.    Physical Exam Updated Vital Signs BP 107/64   Pulse 96   Temp 97.6 F (36.4 C) (Oral)   Resp 15   Ht 5\' 3"  (1.6 m)   Wt 67.1 kg   SpO2 100%   BMI 26.22 kg/m   Physical Exam  Constitutional: She appears well-developed and well-nourished. No distress.  HENT:  Head: Normocephalic  and atraumatic.  Right Ear: External ear normal.  Left Ear: External ear normal.  Nose: Nose normal.  Eyes: Right eye exhibits no discharge. Left eye exhibits no discharge.  Cardiovascular: Normal rate, regular rhythm and normal heart sounds.  Pulmonary/Chest: Effort normal and breath sounds normal.  Abdominal: Soft. She exhibits no distension. There is no tenderness.  Neurological: She is alert.  Skin: Skin is warm and dry. She is not diaphoretic.  Psychiatric: Her mood appears not anxious.  Nursing note and vitals reviewed.    ED Treatments / Results  Labs (all labs ordered are listed, but only abnormal results are displayed) Labs Reviewed  TYPE AND SCREEN    EKG None  Radiology No results found.  Procedures Procedures (including critical care time)  Medications Ordered in ED Medications  pantoprazole (PROTONIX) injection 40 mg (has no administration in time range)     Initial Impression / Assessment and Plan / ED Course  I have reviewed the triage vital signs and the nursing notes.  Pertinent labs & imaging results that were available during my care of the patient were reviewed by me and considered in my medical decision making (see chart for details).     Labs from PCP office reviewed and show hemoglobin 7.2.  Likely this is subacute GI bleed.  With her prior history and previous epigastric abdominal pain and GERD, I am concerned about a gastric ulcer.  She is not symptomatic currently except for the fatigue/dyspnea.  She will be given IV Protonix.  I discussed with gastroenterology, Azucena Freed, and they will consult.  Also consulted with hospitalist, Dr. Steffanie Dunn, who will admit.  Final Clinical Impressions(s) / ED Diagnoses   Final diagnoses:  Symptomatic anemia    ED Discharge Orders    None       Sherwood Gambler, MD 08/24/18 1438

## 2018-08-24 NOTE — Progress Notes (Signed)
Pt arrived to 29M-02 with Rapid Response. Pt on non-rebreather upon arrival with VSS. 10 mL NS infusing through IV pump. Wiped down with Chlorhexidine wipes and oriented to ICU room. Head-to-toe skin assessment performed with Silva Bandy, RN. Skin intact. Awaiting orders. Will continue to closely monitor pt.

## 2018-08-24 NOTE — Telephone Encounter (Signed)
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 Declined admit for further evaluation.. Not taking PPI (fearful due to numerous allergies), but willing to try protonix today if no "sulfa impurities". One black stool yesterday, none today, and will have f/u labs today, o/w stable. Admits she will need EGD and again wants to avoid inpatient evaluation or transfusion if possible. She asks to see you on Urgent Basis. I will place the referral.   Thanks Cathlean Cower MD - Primary Care - [facility]      Dr. Ronnald Ramp,    I am managing the hospital consult service this week and not in the office at all until next week. More than that, 7.5 is a dangerously low hemoglobin and your patient needs to be directed to the hospital emergency department immediately for evaluation and admission.  Please attend to that now.  I see that she refused admission at Urgent Care, but this is an inpatient matter and not appropriate for outpatient management.   [name] [name],   Dr. Ronnald Ramp of [facility] sent me this staff message today on a patient not previously seen by me or any other providers in [facility], and a referral has reportedly been sent to the office.  Please check with the front desk staff to make sure that it is not already in process for office visit.  See my note above - I have advised Dr. Ronnald Ramp to send this patient to the emergency department.  Wilfrid Lund, MD

## 2018-08-24 NOTE — Progress Notes (Signed)
I responded to a CODE BLUE to dysuria.  On arriving to the room, I saw that [name] [name] had been receiving a unit of blood when she began having an anaphylactic reaction.  She had been assessed by the staff before I arrived and been given a dose of Benadryl.  On my arrival, the code team had already arrived was assessing and caring for the patient.  She is breathing heavily and receiving supplemental oxygen.  The patient's needs were being cared for by the code team.  The code team prepared to move her to to ICU for potential intubation.

## 2018-08-24 NOTE — Telephone Encounter (Signed)
Pt has been informed and expressed understanding.  

## 2018-08-24 NOTE — Assessment & Plan Note (Addendum)
Likely PUD with bleed/UGI bleed,l for PPI, recheck CBC, refuses ED but will accept GI referral, asks for Dr Loletha Carrow since her husband sees him  Note:  Total time for pt hx, exam, review of record with pt in the room, determination of diagnoses and plan for further eval and tx is > 40 min, with over 50% spent in coordination and counseling of patient including the differential dx, tx, further evaluation and other management of acute epigastric pain, symptomatic anemia, and syncope

## 2018-08-25 ENCOUNTER — Encounter (HOSPITAL_COMMUNITY)
Admission: EM | Disposition: A | Discharge status: Home / Self Care | Payer: Self-pay | Source: Ambulatory Visit | Attending: Internal Medicine

## 2018-08-25 DIAGNOSIS — D649 Anemia, unspecified: Secondary | ICD-10-CM

## 2018-08-25 DIAGNOSIS — J45909 Unspecified asthma, uncomplicated: Secondary | ICD-10-CM

## 2018-08-25 DIAGNOSIS — D500 Iron deficiency anemia secondary to blood loss (chronic): Secondary | ICD-10-CM

## 2018-08-25 DIAGNOSIS — R062 Wheezing: Secondary | ICD-10-CM

## 2018-08-25 LAB — TRANSFERRIN: Transferrin: 358 mg/dL (ref 192–382)

## 2018-08-25 LAB — BASIC METABOLIC PANEL
Anion gap: 12 (ref 5–15)
BUN: 11 mg/dL (ref 8–23)
CO2: 21 mmol/L — ABNORMAL LOW (ref 22–32)
Calcium: 8.8 mg/dL — ABNORMAL LOW (ref 8.9–10.3)
Chloride: 110 mmol/L (ref 98–111)
Creatinine, Ser: 1.1 mg/dL — ABNORMAL HIGH (ref 0.44–1.00)
GFR calc Af Amer: 59 mL/min — ABNORMAL LOW (ref >60)
GFR calc non Af Amer: 51 mL/min — ABNORMAL LOW (ref >60)
Glucose, Bld: 160 mg/dL — ABNORMAL HIGH (ref 70–99)
Potassium: 3.6 mmol/L (ref 3.5–5.1)
Sodium: 143 mmol/L (ref 135–145)

## 2018-08-25 LAB — CBC
HCT: 22.1 % — ABNORMAL LOW (ref 36.0–46.0)
HCT: 31.5 % — ABNORMAL LOW (ref 36.0–46.0)
Hemoglobin: 6.7 g/dL — CL (ref 12.0–15.0)
Hemoglobin: 10 g/dL — ABNORMAL LOW (ref 12.0–15.0)
MCH: 26.8 pg (ref 26.0–34.0)
MCH: 27.8 pg (ref 26.0–34.0)
MCHC: 30.3 g/dL (ref 30.0–36.0)
MCHC: 31.7 g/dL (ref 30.0–36.0)
MCV: 88.4 fL (ref 80.0–100.0)
MCV: 87.5 fL (ref 80.0–100.0)
Platelets: 565 10*3/uL — ABNORMAL HIGH (ref 150–400)
Platelets: 559 10*3/uL — ABNORMAL HIGH (ref 150–400)
RBC: 2.5 MIL/uL — ABNORMAL LOW (ref 3.87–5.11)
RBC: 3.6 MIL/uL — ABNORMAL LOW (ref 3.87–5.11)
RDW: 15.9 % — ABNORMAL HIGH (ref 11.5–15.5)
RDW: 14.9 % (ref 11.5–15.5)
WBC: 7.1 10*3/uL (ref 4.0–10.5)
WBC: 11.7 10*3/uL — ABNORMAL HIGH (ref 4.0–10.5)
nRBC: 0 % (ref 0.0–0.2)
nRBC: 0.2 % (ref 0.0–0.2)

## 2018-08-25 LAB — TRANSFUSION REACTION
DAT C3: NEGATIVE
Post RXN DAT IgG: NEGATIVE

## 2018-08-25 LAB — FOLATE RBC
Folate, Hemolysate: 232.9 ng/mL
Folate, RBC: 1142 ng/mL (ref >498)
Hematocrit: 20.4 % — ABNORMAL LOW (ref 34.0–46.6)

## 2018-08-25 LAB — FERRITIN: Ferritin: 8 ng/mL — ABNORMAL LOW (ref 11–307)

## 2018-08-25 LAB — IRON AND TIBC
IRON: 15 ug/dL — AB (ref 28–170)
Saturation Ratios: 3 % — ABNORMAL LOW (ref 10.4–31.8)
TIBC: 501 ug/dL — ABNORMAL HIGH (ref 250–450)
UIBC: 486 ug/dL

## 2018-08-25 LAB — PREPARE RBC (CROSSMATCH)

## 2018-08-25 LAB — LACTIC ACID, PLASMA: Lactic Acid, Venous: 3.4 mmol/L (ref 0.5–1.9)

## 2018-08-25 LAB — HIV ANTIBODY (ROUTINE TESTING W REFLEX): HIV Screen 4th Generation wRfx: NONREACTIVE

## 2018-08-25 SURGERY — CANCELLED PROCEDURE

## 2018-08-25 MED ORDER — METHYLPREDNISOLONE SODIUM SUCC 125 MG IJ SOLR
80.0000 mg | Freq: Three times a day (TID) | INTRAMUSCULAR | Status: AC
  Administered 2018-08-26: 80 mg via INTRAVENOUS
  Filled 2018-08-25: qty 2

## 2018-08-25 MED ORDER — PANTOPRAZOLE SODIUM 40 MG PO TBEC
40.0000 mg | DELAYED_RELEASE_TABLET | Freq: Two times a day (BID) | ORAL | Status: DC
  Administered 2018-08-25 – 2018-08-26 (×3): 40 mg via ORAL
  Filled 2018-08-25 (×4): qty 1

## 2018-08-25 MED ORDER — LACTATED RINGERS IV SOLN
INTRAVENOUS | Status: DC
  Administered 2018-08-25: 20:00:00 via INTRAVENOUS

## 2018-08-25 MED FILL — Medication: Qty: 1 | Status: AC

## 2018-08-25 SURGICAL SUPPLY — 14 items

## 2018-08-25 NOTE — Progress Notes (Signed)
Pt transferred from 33M to [place] room 5. A&O x4. Family at bedside. All questions/concerns addressed. Report given to oncoming RN.

## 2018-08-25 NOTE — Progress Notes (Signed)
[facility] Progress Note Patient Name: [name] [name] DOB: 02-20-1951 MRN: [id number]   Date of Service  08/25/2018  HPI/Events of Note  Informed by bedside ICU RN regarding anemia of 6.7 <-- 7.2. Lactic acid is trending down.  eICU Interventions  Hold off on blood transfusion given anaphylaxis.  Need to coordinate with blood bank prior to transfusion.      Intervention Category Intermediate Interventions: Other:  Elsie Lincoln 08/25/2018, 5:02 AM

## 2018-08-25 NOTE — Progress Notes (Signed)
Events of night noted. EGD canceled.  Will see patient later this morning.

## 2018-08-25 NOTE — Progress Notes (Signed)
This was a referral  follow up from on call chaplain to continue support.  Patient is alert smiling and well pleased with all the care and support provided her.  Husband at bedside.  Patient and husband appreciated Franco Collet support upon their arrival to ED. Will follow as needed.    Jaclynn Major, [place], [facility], Pager [phone]

## 2018-08-25 NOTE — Progress Notes (Signed)
[facility] Progress Note  Chief Complaint: Anemia of acute on chronic GI blood loss  History:  Events of last night were noted, with the patient appeared to have a severe immune or bronchospastic reaction during blood transfusion.  She was transferred to the ICU, received critical care evaluation and was treated with Solu-Medrol and bronchodilators.  Her respiratory status is much improved today, she feels that she is breathing comfortably.  She has still not had overt GI bleeding in the way of hematochezia or black tarry stool during this admission.  See original consult note for details, but she had had some black stool perhaps a week prior to this admission. Her hemoglobin was down to 6.7 today, most likely the result of hemodilution from IV fluids, since she does not appear to have overt bleeding at this time.  He denies abdominal pain or chest pain.  Objective:  Med list reviewed  Vital signs in last 24 hrs: Vitals:   08/25/18 0800 08/25/18 0803  BP: 130/68   Pulse: (!) 102   Resp: 13   Temp:  98 F (36.7 C)  SpO2: 100%     Physical Exam She is sitting up in bed, reading on her computer, comfortable and conversant.   HEENT: sclera anicteric, oral mucosa moist without lesions  Neck: supple, no thyromegaly, JVD or lymphadenopathy  Cardiac: RRR without murmurs, S1S2 heard, no peripheral edema  Pulm: clear to auscultation bilaterally, wheezing or stridor, normal respiratory effort and rate.  Abdomen: soft, no tenderness, with active bowel sounds. No guarding or palpable hepatosplenomegaly  Skin; warm and dry, no jaundice or rash.  He is pale as before  Recent Labs:  Recent Labs  Lab 08/22/18 1250 08/24/18 1042 08/25/18 0306  WBC 7.7 5.7 7.1  HGB 7.5* 7.2 Repeated and verified X2.* 6.7*  HCT 24.3* 21.4 Repeated and verified X2.* 22.1*  PLT 490* 585.0* 565*   Recent Labs  Lab 08/24/18 1805 08/25/18 0306  NA 143 143  K 3.3* 3.6  CL 109 110  CO2 19* 21*  BUN  15 11  ALBUMIN 3.6  --   ALKPHOS 56  --   ALT 14  --   AST 21  --   GLUCOSE 136* 160*   Recent Labs  Lab 08/24/18 1042  INR 1.0    Radiologic studies: Portable chest x-ray last evening shows left basilar atelectasis or infiltrate  @ASSESSMENTPLANBEGIN @ Assessment: Anemia mostly chronic GI blood loss, perhaps a recent subacute component as well. Chronic aspirin use and history of peptic ulcer disease Severe reaction during blood transfusion, sounds again was more bronchospastic than anaphylactic.  I spoke with her at length along with Dr. Lamonte Sakai of the critical care service.  We are at an impasse regarding endoscopic work-up unless Jaszmine has an improved hemoglobin level.  She is currently stable on regular dose of Solu-Medrol 80 mg every 8 hours, and she is considering further blood transfusion.  Dr. Lamonte Sakai agrees that should be done in the ICU setting given the events of last evening.  If she tolerates 1 unit, I would prefer that she have a total of 2 unit PRBC transfusion so we can be sure her hemoglobin is above 8 before she undergoes any endoscopic procedure.  Upper endoscopy that had been scheduled today was canceled, and I will reevaluate the timing depending on her clinical progress.  She will remain on twice daily Protonix which can be given orally, I have written a solid food diet for today, and I  will make her n.p.o. after midnight in case tomorrow looks favorable for the upper endoscopy.  Total 35-minute time, over half spent in discussion with patient and other providers as noted above.  [name] Office: [phone]

## 2018-08-25 NOTE — Progress Notes (Signed)
NAME:  [name] [name], MRN:  [id number], DOB:  08-25-1951, LOS: 1 ADMISSION DATE:  08/24/2018, CONSULTATION DATE:  08/24/18 REFERRING MD:  Silverio Lay MD, CHIEF COMPLAINT:  Anaphylaxis  Brief History   67 year old with symptomatic anemia, black tarry stools.  Admitted for evaluation of GI bleed.  Developed allergic/anaphylactic reaction while getting a blood transfusion.  Transferred to ICU after CODE BLUE called on the floor.  Patient developed, throat itching, flushing, dyspnea with wheezing 10 to 15 minutes into blood transfusion reaction.  Given 125 mg Solu-Medrol and 50 mg of Benadryl.  Unable to give epinephrine or Pepcid due to previously recorded allergies.  Patient refused to get intubated. Transferred to ICU for closer monitoring.  Past Medical History  Asthma, breast cancer, GERD, irritable bowel syndrome, migraine, breast cancer with bilateral mastectomies  Significant Hospital Events   12/11- Admit. Developed anaphylatic reaction during blood transfusion  Consults:  GI 12/11 PCCM 12/11  Procedures:    Significant Diagnostic Tests:    Micro Data:    Antimicrobials:    Interim history/subjective:  Breathing is now back to normal. She is on room air without any itching, airway compromise No more blood is been given thus far   Objective   Blood pressure 130/68, pulse (!) 102, temperature 98 F (36.7 C), temperature source Oral, resp. rate 13, height 5\' 3"  (1.6 m), weight 67 kg, SpO2 100 %.    FiO2 (%):  [2 %-100 %] 2 %   Intake/Output Summary (Last 24 hours) at 08/25/2018 0914 Last data filed at 08/25/2018 0800 Gross per 24 hour  Intake 1080 ml  Output 1 ml  Net 1079 ml   Filed Weights   08/24/18 1401 08/24/18 1600  Weight: 67.1 kg 67 kg    Examination: Gen:      Moderate respiratory distress HEENT:  EOMI, sclera anicteric.  No tongue, lip swelling.  No stridor noted. Neck:     Flushed face, neck and arms. Lungs:    Very poor air entry, tight  expiratory wheeze. CV:         Regular rate and rhythm; no murmurs Abd:      + bowel sounds; soft, non-tender; no palpable masses, no distension Ext:    No edema; adequate peripheral perfusion Skin:      Warm and dry Neuro: Awake, responsive.  Resolved Hospital Problem list     Assessment & Plan:  67 year old with allergic/anaphylactic reaction while getting blood transfusion She is significantly better after getting Xopenex nebs, Solu-Medrol and Benadryl. Episode was more like allergic/bronchospastic reaction (h/o asthma) rather than anaphylaxis as her blood pressure is stable with no lip, throat swelling or stridor  Allergic/anaphylactic reaction with underlying history of asthma Continue Solu-Medrol scheduled, Benadryl scheduled for now.  I had like for her to be on this certainly in preparation for possible repeat transfusion attempt. Pepcid is on her allergy list so we will avoid. She uses Xopenex (allergic to albuterol) She uses Dulera sometimes at home, not reliably on a schedule. I will keep her ICU until she is able to safely complete her transfusion and then she can transition out.  Anemia, possible GI bleed Discussed her transfusion reaction, pathology evaluation with the blood bank today 12/12.  No evidence of hemolysis seen.  There was not a mismatch noted.  No additional antibody testing is necessary and there should not be any special precautions needed in order to reattempt transfusion. Follow CBC Appreciate gastroenterology input.  I discussed the case with Dr.  Danis this morning.  He believes that she needs an EGD to fully evaluate for her chronic blood loss but this does not need to be done urgently.  Postponed today and plan to perform when she is more stable, has hemoglobin greater than 8.0.    Best practice:  Diet: N.p.o. Pain/Anxiety/Delirium protocol (if indicated): NA VAP protocol (if indicated): NA DVT prophylaxis: SCDs GI prophylaxis: NA Mobility:  bedrest Code Status: Full code Family: Communication:disussed w pt 12/12 Disposition:   Labs   CBC: Recent Labs  Lab 08/22/18 1250 08/24/18 1042 08/25/18 0306  WBC 7.7 5.7 7.1  NEUTROABS  --  2.9  --   HGB 7.5* 7.2 Repeated and verified X2.* 6.7*  HCT 24.3* 21.4 Repeated and verified X2.* 22.1*  MCV 89.3 84.5 88.4  PLT 490* 585.0* 565*    Basic Metabolic Panel: Recent Labs  Lab 08/22/18 1250 08/24/18 1042 08/24/18 1805 08/25/18 0306  NA 139 141 143 143  K 3.3* 3.9 3.3* 3.6  CL 105 106 109 110  CO2 24 26 19* 21*  GLUCOSE 105* 105* 136* 160*  BUN 23 18 15 11   CREATININE 1.04* 1.19 1.21* 1.10*  CALCIUM 9.3 9.5 9.0 8.8*   GFR: Estimated Creatinine Clearance: 44.3 mL/min (A) (by C-G formula based on SCr of 1.1 mg/dL (H)). Recent Labs  Lab 08/22/18 1250 08/24/18 1042 08/24/18 1754 08/25/18 0306  WBC 7.7 5.7  --  7.1  LATICACIDVEN  --   --  3.7* 3.4*    Liver Function Tests: Recent Labs  Lab 08/24/18 1042 08/24/18 1805  AST 16 21  ALT 9 14  ALKPHOS 49 56  BILITOT 0.3 0.6  PROT 6.6 6.4*  ALBUMIN 4.0 3.6   No results for input(s): LIPASE, AMYLASE in the last 168 hours. No results for input(s): AMMONIA in the last 168 hours.  ABG No results found for: PHART, PCO2ART, PO2ART, HCO3, TCO2, ACIDBASEDEF, O2SAT   Coagulation Profile: Recent Labs  Lab 08/24/18 1042  INR 1.0    Cardiac Enzymes: Recent Labs  Lab 08/22/18 1250 08/24/18 1805  TROPONINI <0.03 <0.03    HbA1C: No results found for: HGBA1C  CBG: No results for input(s): GLUCAP in the last 168 hours.   The patient is critically ill with multiple organ system failure and requires high complexity decision making for assessment and support, frequent evaluation and titration of therapies, advanced monitoring, review of radiographic studies and interpretation of complex data.   Critical Care Time devoted to patient care services, exclusive of separately billable procedures, described in this  note is 32 minutes.   Baltazar Apo, MD, PhD 08/25/2018, 9:47 AM [facility] [phone] or if no answer [phone]

## 2018-08-25 NOTE — Anesthesia Preprocedure Evaluation (Deleted)
Anesthesia Evaluation    Reviewed: Allergy & Precautions, Patient's Chart, lab work & pertinent test results  Airway Mallampati: II  TM Distance: >3 FB Neck ROM: Full    Dental  (+) Teeth Intact, Dental Advisory Given   Pulmonary asthma ,    Pulmonary exam normal breath sounds clear to auscultation       Cardiovascular negative cardio ROS Normal cardiovascular exam Rhythm:Regular Rate:Normal     Neuro/Psych  Headaches,    GI/Hepatic Neg liver ROS, GERD  Medicated,  Endo/Other  negative endocrine ROS  Renal/GU negative Renal ROS     Musculoskeletal negative musculoskeletal ROS (+)   Abdominal   Peds  Hematology  (+) Blood dyscrasia, anemia ,   Anesthesia Other Findings Day of surgery medications reviewed with the patient.  H/o breast cancer  Reproductive/Obstetrics                             Anesthesia Physical Anesthesia Plan  ASA: III  Anesthesia Plan: MAC   Post-op Pain Management:    Induction: Intravenous  PONV Risk Score and Plan: 2 and Propofol infusion and Treatment may vary due to age or medical condition  Airway Management Planned: Nasal Cannula and Natural Airway  Additional Equipment:   Intra-op Plan:   Post-operative Plan:   Informed Consent:   Plan Discussed with: CRNA and Anesthesiologist  Anesthesia Plan Comments:         Anesthesia Quick Evaluation

## 2018-08-25 NOTE — Significant Event (Signed)
Rapid Response Event Note RN called regarding allergic reaction to blood Overview: **late entry    Pt had been receiving blood when she had an allergic reaction to the blood. RN Ulice Dash followed protocol; stopped blood, gave a 250cc bolus of NS with new tubing, paged MD.  Initial Focused Assessment: On arrival pt sitting upright in a tripod position, face and throat red, lips were pale white, unable to take in a full breath, fast rapid breathing, upper airway wheezing, raspy voice, mild secretions coming from mouth, on 2L [place]. Pt placed on NRB Unable to give epinephrine or Pepcid due to a previous allergies. Code blue iniated for possible intubation. Dr. Kimber Relic with CCM to bedside. Pt admitted to ICU for closer monitoring as pt refused to be intubated.  Interventions: 50 mg Benadryl IVP 125 mg Solumedrol IVP    Event Summary:   at      at          [facility], Sela Hua

## 2018-08-25 NOTE — Progress Notes (Signed)
[name] [name] was admitted to 5w05 from 31M via wheelchair.  The patient ambulated from wheelchair to bed without incident.  The patient is alert and oriented x4.  The patient's husband is at the bedside.  Bed is in the lowest position.  Call bell and telephone are within reach, explained to patient how to use call bell and telephone, patient indicated understanding.  Patient denies any further needs at this time.  Will continue to monitor.

## 2018-08-25 NOTE — H&P (View-Only) (Signed)
[facility] Progress Note  Chief Complaint: Anemia of acute on chronic GI blood loss  History:  Events of last night were noted, with the patient appeared to have a severe immune or bronchospastic reaction during blood transfusion.  She was transferred to the ICU, received critical care evaluation and was treated with Solu-Medrol and bronchodilators.  Her respiratory status is much improved today, she feels that she is breathing comfortably.  She has still not had overt GI bleeding in the way of hematochezia or black tarry stool during this admission.  See original consult note for details, but she had had some black stool perhaps a week prior to this admission. Her hemoglobin was down to 6.7 today, most likely the result of hemodilution from IV fluids, since she does not appear to have overt bleeding at this time.  He denies abdominal pain or chest pain.  Objective:  Med list reviewed  Vital signs in last 24 hrs: Vitals:   08/25/18 0800 08/25/18 0803  BP: 130/68   Pulse: (!) 102   Resp: 13   Temp:  98 F (36.7 C)  SpO2: 100%     Physical Exam She is sitting up in bed, reading on her computer, comfortable and conversant.   HEENT: sclera anicteric, oral mucosa moist without lesions  Neck: supple, no thyromegaly, JVD or lymphadenopathy  Cardiac: RRR without murmurs, S1S2 heard, no peripheral edema  Pulm: clear to auscultation bilaterally, wheezing or stridor, normal respiratory effort and rate.  Abdomen: soft, no tenderness, with active bowel sounds. No guarding or palpable hepatosplenomegaly  Skin; warm and dry, no jaundice or rash.  He is pale as before  Recent Labs:  Recent Labs  Lab 08/22/18 1250 08/24/18 1042 08/25/18 0306  WBC 7.7 5.7 7.1  HGB 7.5* 7.2 Repeated and verified X2.* 6.7*  HCT 24.3* 21.4 Repeated and verified X2.* 22.1*  PLT 490* 585.0* 565*   Recent Labs  Lab 08/24/18 1805 08/25/18 0306  NA 143 143  K 3.3* 3.6  CL 109 110  CO2 19* 21*  BUN  15 11  ALBUMIN 3.6  --   ALKPHOS 56  --   ALT 14  --   AST 21  --   GLUCOSE 136* 160*   Recent Labs  Lab 08/24/18 1042  INR 1.0    Radiologic studies: Portable chest x-ray last evening shows left basilar atelectasis or infiltrate  @ASSESSMENTPLANBEGIN @ Assessment: Anemia mostly chronic GI blood loss, perhaps a recent subacute component as well. Chronic aspirin use and history of peptic ulcer disease Severe reaction during blood transfusion, sounds again was more bronchospastic than anaphylactic.  I spoke with her at length along with Dr. Lamonte Sakai of the critical care service.  We are at an impasse regarding endoscopic work-up unless [name] [name] has an improved hemoglobin level.  She is currently stable on regular dose of Solu-Medrol 80 mg every 8 hours, and she is considering further blood transfusion.  Dr. Lamonte Sakai agrees that should be done in the ICU setting given the events of last evening.  If she tolerates 1 unit, I would prefer that she have a total of 2 unit PRBC transfusion so we can be sure her hemoglobin is above 8 before she undergoes any endoscopic procedure.  Upper endoscopy that had been scheduled today was canceled, and I will reevaluate the timing depending on her clinical progress.  She will remain on twice daily Protonix which can be given orally, I have written a solid food diet for today, and I  will make her n.p.o. after midnight in case tomorrow looks favorable for the upper endoscopy.  Total 35-minute time, over half spent in discussion with patient and other providers as noted above.  [name] [name] III Office: [phone]

## 2018-08-25 NOTE — Progress Notes (Addendum)
CRITICAL VALUE ALERT  Critical Value:  Hb 6.7   LA 3.4  Date & Time Notied:  08/25/2018  0500  Provider Notified: Dr. James Ivanoff  Orders Received/Actions taken: No new order at this time

## 2018-08-26 ENCOUNTER — Inpatient Hospital Stay (HOSPITAL_COMMUNITY): Payer: Medicare Other | Admitting: Registered Nurse

## 2018-08-26 ENCOUNTER — Encounter (HOSPITAL_COMMUNITY): Payer: Self-pay | Admitting: *Deleted

## 2018-08-26 ENCOUNTER — Encounter (HOSPITAL_COMMUNITY)
Admission: EM | Disposition: A | Discharge status: Home / Self Care | Payer: Self-pay | Source: Ambulatory Visit | Attending: Internal Medicine

## 2018-08-26 DIAGNOSIS — D509 Iron deficiency anemia, unspecified: Secondary | ICD-10-CM

## 2018-08-26 HISTORY — PX: ESOPHAGOGASTRODUODENOSCOPY: SHX5428

## 2018-08-26 LAB — BPAM RBC
BLOOD PRODUCT EXPIRATION DATE: 202001032359
Blood Product Expiration Date: 202001052359
Blood Product Expiration Date: 202001052359
ISSUE DATE / TIME: 201912111646
ISSUE DATE / TIME: 201912121049
ISSUE DATE / TIME: 201912121428
Unit Type and Rh: 5100
Unit Type and Rh: 5100
Unit Type and Rh: 5100

## 2018-08-26 LAB — CBC
HCT: 30.1 % — ABNORMAL LOW (ref 36.0–46.0)
Hemoglobin: 9.3 g/dL — ABNORMAL LOW (ref 12.0–15.0)
MCH: 27 pg (ref 26.0–34.0)
MCHC: 30.9 g/dL (ref 30.0–36.0)
MCV: 87.5 fL (ref 80.0–100.0)
NRBC: 0 % (ref 0.0–0.2)
Platelets: 541 10*3/uL — ABNORMAL HIGH (ref 150–400)
RBC: 3.44 MIL/uL — ABNORMAL LOW (ref 3.87–5.11)
RDW: 15.2 % (ref 11.5–15.5)
WBC: 11.3 10*3/uL — ABNORMAL HIGH (ref 4.0–10.5)

## 2018-08-26 LAB — TYPE AND SCREEN
ABO/RH(D): O POS
Antibody Screen: NEGATIVE
Unit division: 0
Unit division: 0
Unit division: 0

## 2018-08-26 SURGERY — EGD (ESOPHAGOGASTRODUODENOSCOPY)
Anesthesia: Monitor Anesthesia Care

## 2018-08-26 MED ORDER — SODIUM CHLORIDE 0.9 % IV SOLN: INTRAVENOUS | Status: DC

## 2018-08-26 MED ORDER — FERROUS SULFATE 325 (65 FE) MG PO TABS: 325.0000 mg | ORAL_TABLET | Freq: Two times a day (BID) | ORAL | 0 refills | Status: DC

## 2018-08-26 MED ORDER — PROPOFOL 500 MG/50ML IV EMUL
INTRAVENOUS | Status: DC | PRN
  Administered 2018-08-26: 120 ug/kg/min via INTRAVENOUS

## 2018-08-26 MED ORDER — LACTATED RINGERS IV SOLN
INTRAVENOUS | Status: DC
  Administered 2018-08-26: 12:00:00 via INTRAVENOUS

## 2018-08-26 MED ORDER — PROPOFOL 10 MG/ML IV BOLUS
INTRAVENOUS | Status: DC | PRN
  Administered 2018-08-26: 10 mg via INTRAVENOUS
  Administered 2018-08-26: 30 mg via INTRAVENOUS
  Administered 2018-08-26 (×2): 20 mg via INTRAVENOUS
  Administered 2018-08-26: 30 mg via INTRAVENOUS

## 2018-08-26 MED ORDER — LIDOCAINE 2% (20 MG/ML) 5 ML SYRINGE
INTRAMUSCULAR | Status: DC | PRN
  Administered 2018-08-26: 60 mg via INTRAVENOUS

## 2018-08-26 NOTE — Anesthesia Preprocedure Evaluation (Addendum)
Anesthesia Evaluation  Patient identified by MRN, date of birth, ID band Patient awake    Reviewed: Allergy & Precautions, NPO status , Patient's Chart, lab work & pertinent test results  History of Anesthesia Complications Negative for: history of anesthetic complications  Airway Mallampati: II  TM Distance: >3 FB Neck ROM: Full    Dental no notable dental hx.    Pulmonary asthma ,    Pulmonary exam normal        Cardiovascular negative cardio ROS Normal cardiovascular exam     Neuro/Psych  Headaches, negative psych ROS   GI/Hepatic Neg liver ROS, GERD  ,  Endo/Other  negative endocrine ROS  Renal/GU negative Renal ROS  negative genitourinary   Musculoskeletal negative musculoskeletal ROS (+)   Abdominal   Peds  Hematology  (+) anemia ,   Anesthesia Other Findings   Reproductive/Obstetrics                             Anesthesia Physical Anesthesia Plan  ASA: III  Anesthesia Plan: MAC   Post-op Pain Management:    Induction:   PONV Risk Score and Plan: 2 and Propofol infusion, TIVA and Treatment may vary due to age or medical condition  Airway Management Planned: Nasal Cannula and Simple Face Mask  Additional Equipment: None  Intra-op Plan:   Post-operative Plan:   Informed Consent: I have reviewed the patients History and Physical, chart, labs and discussed the procedure including the risks, benefits and alternatives for the proposed anesthesia with the patient or authorized representative who has indicated his/her understanding and acceptance.     Plan Discussed with:   Anesthesia Plan Comments:         Anesthesia Quick Evaluation

## 2018-08-26 NOTE — Discharge Summary (Signed)
PATIENT DETAILS Name: [name] [name] Age: 67 y.o. Sex: female Date of Birth: 1950-11-11 MRN: [id number]. Admitting Physician: Janora Norlander, MD [id number], Hunt Oris, MD  Admit Date: 08/24/2018 Discharge date: 08/26/2018  Recommendations for Outpatient Follow-up:  1. Follow up with PCP in 1-2 weeks 2. 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  She received PRBC transfusion and had a allergic reaction requiring activation of CODE BLUE and transferred to the ICU.  She improved after IV steroids and IV Pepcid.  She subsequently underwent an EGD on 12/13 that did not show any major abnormality.  Brief Hospital Course: Upper GI bleeding with acute blood loss anemia: Resolved-no further melanotic stools-hemoglobin stable after 3 units of PRBC.  EGD on 12/13 was unremarkable.  Per GI no further recommendations while inpatient-okay to discharge-GI will arrange for outpatient colonoscopy.  Will discharge on oral iron supplementation.    Allergic reaction following PRBC transfusion: Post admission-patient had a allergic response to PRBC transfusion-requiring IV Benadryl and Solu-Medrol-she was transferred to the ICU for closer monitoring.  She refused intubation-but then rapidly improved.  In the ICU setting-she was transfused 2 units of PRBC without any adverse effects.    Procedures/Studies: 12/13>>EGD: - Normal esophagus.  - Normal stomach.  - Normal examined duodenum.  Discharge Diagnoses:  Principal Problem:   Symptomatic anemia Active Problems:   Non-allergic asthma   GERD   Iron deficiency anemia   GI  bleed   Wheezing   Discharge Instructions:  Activity:  As tolerated    Discharge Instructions    Diet - low sodium heart healthy   Complete by:  As directed    Discharge instructions   Complete by:  As directed    Follow with Primary MD  Biagio Borg, MD in 1 week  Please get a complete blood count and chemistry panel checked by your Primary MD at your next visit, and again as instructed by your Primary MD.  Get Medicines reviewed and adjusted: Please take all your medications with you for your next visit with your Primary MD  Laboratory/radiological data: Please request your Primary MD to go over all hospital tests and procedure/radiological results at the follow up, please ask your Primary MD to get all Hospital records sent to his/her office.  In some cases, they will be blood work, cultures and biopsy results pending at the time of your discharge. Please request that your primary care M.D. follows up on these results.  Also Note the following: If you experience worsening of your admission symptoms, develop shortness of breath, life threatening emergency, suicidal or homicidal thoughts you must seek medical attention immediately by calling 911 or calling your MD immediately  if symptoms less severe.  You must read complete instructions/literature along with all the possible adverse reactions/side effects for all the Medicines you take and that have been prescribed to you. Take any new Medicines after you have completely understood and accpet all the possible adverse reactions/side effects.   Do not drive when taking Pain medications or sleeping medications (Benzodaizepines)  Do not take more than prescribed Pain, Sleep and Anxiety Medications. It is  not advisable to combine anxiety,sleep and pain medications without talking with your primary care practitioner  Special Instructions: If you have smoked or chewed Tobacco  in the last 2 yrs please stop smoking, stop any regular  Alcohol  and or any Recreational drug use.  Wear Seat belts while driving.  Please note: You were cared for by a hospitalist during your hospital stay. Once you are discharged, your primary care physician will handle any further medical issues. Please note that NO REFILLS for any discharge medications will be authorized once you are discharged, as it is imperative that you return to your primary care physician (or establish a relationship with a primary care physician if you do not have one) for your post hospital discharge needs so that they can reassess your need for medications and monitor your lab values.   Increase activity slowly   Complete by:  As directed      Allergies as of 08/26/2018      Reactions   Albuterol Sulfate Anaphylaxis   Pt states she has tolerated levalbuterol   Amoxicillin-pot Clavulanate    REACTION: ANAPHYLACTIC REACTION Lips swell    Epipen [epinephrine] Other (See Comments)   Allergic to preservatives in Epipen with caution!!!   Fluzone    REACTION: ANAPHYLACTIC   Influenza Vaccines Anaphylaxis   Morphine    REACTION: ANAPHYLACTIC REACTION   Povidone-iodine    REACTION: HIVES/ANAPHYLACTIC WITH INJECTABLE DYE   Sulfonamide Derivatives    REACTION: ANAPHYLACTIC REACTION   Atrovent [ipratropium] Other (See Comments)   Has sulfa in preservative   Ciprofloxacin    Itchy palms   Hyoscyamine Sulfate Hives   Meperidine Hcl    REACTION: N\T\V   Other    Mucomist-unable to breathe   Pepcid [famotidine] Other (See Comments)   Pneumococcal Vaccines    Pt declines   Ppd [tuberculin Purified Protein Derivative]    Arm swelling with red streaks   Reglan [metoclopramide] Other (See Comments)   Zostavax [zoster Vaccine Live]    Pt declines      Medication List    TAKE these medications   aspirin-acetaminophen-caffeine 250-250-65 MG tablet Commonly known as:  EXCEDRIN MIGRAINE Take 1 tablet by mouth every 6 (six) hours as needed. For migraine   Calcium  Carbonate-Vit D-Min 600-200 MG-UNIT Tabs Take 1 tablet by mouth 2 (two) times daily.   cyclobenzaprine 10 MG tablet Commonly known as:  FLEXERIL Take 1 tablet (10 mg total) by mouth 3 (three) times daily.   diphenhydrAMINE 25 MG tablet Commonly known as:  BENADRYL Take 25 mg by mouth every 8 (eight) hours as needed. For allergies   ferrous sulfate 325 (65 FE) MG tablet Take 1 tablet (325 mg total) by mouth 2 (two) times daily with a meal.   levalbuterol 1.25 MG/3ML nebulizer solution Commonly known as:  XOPENEX Take 1.25 mg by nebulization every 4 (four) hours as needed for wheezing.   levalbuterol 45 MCG/ACT inhaler Commonly known as:  XOPENEX HFA Inhale 1-2 puffs into the lungs every 6 (six) hours as needed. For shortness of breath   Melatonin 10 MG Caps Take 10 mg by mouth at bedtime.   mometasone-formoterol 200-5 MCG/ACT Aero Commonly known as:  DULERA Inhale 2 puffs into the lungs 2 (two) times daily. Rinse mouth   pantoprazole 40 MG tablet Commonly known as:  PROTONIX Take 1 tablet (40 mg total) by mouth daily.   VITAMIN B 12 PO Take 1 tablet by mouth daily.   vitamin C  1000 MG tablet Take 1,000 mg by mouth daily.   vitamin E 400 UNIT capsule Take 400 Units by mouth daily.   zolpidem 10 MG tablet Commonly known as:  AMBIEN TAKE 1 TABLET BY MOUTH AT BEDTIME      Follow-up Information    Biagio Borg, MD. Schedule an appointment as soon as possible for a visit in 1 week(s).   Specialties:  Internal Medicine, Radiology Contact information: [address] [place] [address] [phone]          Allergies  Allergen Reactions  . Albuterol Sulfate Anaphylaxis    Pt states she has tolerated levalbuterol  . Amoxicillin-Pot Clavulanate     REACTION: ANAPHYLACTIC REACTION  Lips swell   . Epipen [Epinephrine] Other (See Comments)    Allergic to preservatives in Epipen with caution!!!  . Fluzone     REACTION: ANAPHYLACTIC  . Influenza  Vaccines Anaphylaxis  . Morphine     REACTION: ANAPHYLACTIC REACTION  . Povidone-Iodine     REACTION: HIVES/ANAPHYLACTIC WITH INJECTABLE DYE  . Sulfonamide Derivatives     REACTION: ANAPHYLACTIC REACTION  . Atrovent [Ipratropium] Other (See Comments)    Has sulfa in preservative  . Ciprofloxacin     Itchy palms  . Hyoscyamine Sulfate Hives  . Meperidine Hcl     REACTION: N\T\V  . Other     Mucomist-unable to breathe  . Pepcid [Famotidine] Other (See Comments)  . Pneumococcal Vaccines     Pt declines  . Ppd [Tuberculin Purified Protein Derivative]     Arm swelling with red streaks  . Reglan [Metoclopramide] Other (See Comments)  . Zostavax [Zoster Vaccine Live]     Pt declines      Consultations:  GI, PCCM   Other Procedures/Studies: Dg Chest 2 View  Result Date: 08/22/2018 CLINICAL DATA:  Chest pain EXAM: CHEST - 2 VIEW COMPARISON:  11/29/2012 FINDINGS: The heart size and mediastinal contours are within normal limits. Both lungs are clear. The visualized skeletal structures are unremarkable. IMPRESSION: No active cardiopulmonary disease. Electronically Signed   By: Franchot Gallo M.D.   On: 08/22/2018 12:57   Dg Chest Port 1 View  Result Date: 08/24/2018 CLINICAL DATA:  Dyspnea EXAM: PORTABLE CHEST 1 VIEW COMPARISON:  08/22/2018 FINDINGS: Normal heart size. Atelectasis and/or airspace disease within the left lung base identified. Right lung appears clear. No pleural effusion or edema. IMPRESSION: 1. Left base opacity which may represent atelectasis and/or pneumonia. Electronically Signed   By: Kerby Moors M.D.   On: 08/24/2018 18:29     TODAY-DAY OF DISCHARGE:  Subjective:   Afnan Cadiente today has no headache,no chest abdominal pain,no new weakness tingling or numbness, feels much better wants to go home today.   Objective:   Blood pressure 116/78, pulse (!) 49, temperature 97.7 F (36.5 C), resp. rate 18, height 5\' 3"  (1.6 m), weight 67.5 kg, SpO2 100  %.  Intake/Output Summary (Last 24 hours) at 08/26/2018 1326 Last data filed at 08/26/2018 1201 Gross per 24 hour  Intake 1200.24 ml  Output 0 ml  Net 1200.24 ml   Filed Weights   08/24/18 1600 08/25/18 1924 08/26/18 1123  Weight: 67 kg 67.5 kg 67.5 kg    Exam: Awake Alert, Oriented *3, No new F.N deficits, Normal affect [place].AT,PERRAL Supple Neck,No JVD, No cervical lymphadenopathy appriciated.  Symmetrical Chest wall movement, Good air movement bilaterally, CTAB RRR,No Gallops,Rubs or new Murmurs, No Parasternal Heave +ve B.Sounds, Abd Soft, Non tender, No  organomegaly appriciated, No rebound -guarding or rigidity. No Cyanosis, Clubbing or edema, No new Rash or bruise   PERTINENT RADIOLOGIC STUDIES: Dg Chest 2 View  Result Date: 08/22/2018 CLINICAL DATA:  Chest pain EXAM: CHEST - 2 VIEW COMPARISON:  11/29/2012 FINDINGS: The heart size and mediastinal contours are within normal limits. Both lungs are clear. The visualized skeletal structures are unremarkable. IMPRESSION: No active cardiopulmonary disease. Electronically Signed   By: Franchot Gallo M.D.   On: 08/22/2018 12:57   Dg Chest Port 1 View  Result Date: 08/24/2018 CLINICAL DATA:  Dyspnea EXAM: PORTABLE CHEST 1 VIEW COMPARISON:  08/22/2018 FINDINGS: Normal heart size. Atelectasis and/or airspace disease within the left lung base identified. Right lung appears clear. No pleural effusion or edema. IMPRESSION: 1. Left base opacity which may represent atelectasis and/or pneumonia. Electronically Signed   By: Kerby Moors M.D.   On: 08/24/2018 18:29     PERTINENT LAB RESULTS: CBC: Recent Labs    08/25/18 2019 08/26/18 0351  WBC 11.7* 11.3*  HGB 10.0* 9.3*  HCT 31.5* 30.1*  PLT 559* 541*   CMET CMP     Component Value Date/Time   NA 143 08/25/2018 0306   K 3.6 08/25/2018 0306   CL 110 08/25/2018 0306   CO2 21 (L) 08/25/2018 0306   GLUCOSE 160 (H) 08/25/2018 0306   BUN 11 08/25/2018 0306   CREATININE 1.10 (H)  08/25/2018 0306   CALCIUM 8.8 (L) 08/25/2018 0306   PROT 6.4 (L) 08/24/2018 1805   ALBUMIN 3.6 08/24/2018 1805   AST 21 08/24/2018 1805   ALT 14 08/24/2018 1805   ALKPHOS 56 08/24/2018 1805   BILITOT 0.6 08/24/2018 1805   GFRNONAA 51 (L) 08/25/2018 0306   GFRAA 59 (L) 08/25/2018 0306    GFR Estimated Creatinine Clearance: 44.5 mL/min (A) (by C-G formula based on SCr of 1.1 mg/dL (H)). No results for input(s): LIPASE, AMYLASE in the last 72 hours. Recent Labs    08/24/18 1805  TROPONINI <0.03   Invalid input(s): POCBNP No results for input(s): DDIMER in the last 72 hours. No results for input(s): HGBA1C in the last 72 hours. No results for input(s): CHOL, HDL, LDLCALC, TRIG, CHOLHDL, LDLDIRECT in the last 72 hours. No results for input(s): TSH, T4TOTAL, T3FREE, THYROIDAB in the last 72 hours.  Invalid input(s): FREET3 Recent Labs    08/25/18 0953  FERRITIN 8*  TIBC 501*  IRON 15*   Coags: Recent Labs    08/24/18 1042  INR 1.0   Microbiology: No results found for this or any previous visit (from the past 240 hour(s)).  FURTHER DISCHARGE INSTRUCTIONS:  Get Medicines reviewed and adjusted: Please take all your medications with you for your next visit with your Primary MD  Laboratory/radiological data: Please request your Primary MD to go over all hospital tests and procedure/radiological results at the follow up, please ask your Primary MD to get all Hospital records sent to his/her office.  In some cases, they will be blood work, cultures and biopsy results pending at the time of your discharge. Please request that your primary care M.D. goes through all the records of your hospital data and follows up on these results.  Also Note the following: If you experience worsening of your admission symptoms, develop shortness of breath, life threatening emergency, suicidal or homicidal thoughts you must seek medical attention immediately by calling 911 or calling your MD  immediately  if symptoms less severe.  You must read complete instructions/literature along with  all the possible adverse reactions/side effects for all the Medicines you take and that have been prescribed to you. Take any new Medicines after you have completely understood and accpet all the possible adverse reactions/side effects.   Do not drive when taking Pain medications or sleeping medications (Benzodaizepines)  Do not take more than prescribed Pain, Sleep and Anxiety Medications. It is not advisable to combine anxiety,sleep and pain medications without talking with your primary care practitioner  Special Instructions: If you have smoked or chewed Tobacco  in the last 2 yrs please stop smoking, stop any regular Alcohol  and or any Recreational drug use.  Wear Seat belts while driving.  Please note: You were cared for by a hospitalist during your hospital stay. Once you are discharged, your primary care physician will handle any further medical issues. Please note that NO REFILLS for any discharge medications will be authorized once you are discharged, as it is imperative that you return to your primary care physician (or establish a relationship with a primary care physician if you do not have one) for your post hospital discharge needs so that they can reassess your need for medications and monitor your lab values.  Total Time spent coordinating discharge including counseling, education and face to face time equals 25 minutes.  Signed: Emmalyn Hinson 08/26/2018 1:26 PM

## 2018-08-26 NOTE — Anesthesia Postprocedure Evaluation (Signed)
Anesthesia Post Note  Patient: [name] [name]  Procedure(s) Performed: ESOPHAGOGASTRODUODENOSCOPY (EGD) (N/A )     Patient location during evaluation: PACU Anesthesia Type: MAC Level of consciousness: awake and alert Pain management: pain level controlled Vital Signs Assessment: post-procedure vital signs reviewed and stable Respiratory status: spontaneous breathing, nonlabored ventilation and respiratory function stable Cardiovascular status: blood pressure returned to baseline and stable Postop Assessment: no apparent nausea or vomiting Anesthetic complications: no    Last Vitals:  Vitals:   08/26/18 1225 08/26/18 1257  BP: (!) 125/58 116/78  Pulse: 62 (!) 49  Resp: 13 18  Temp:  36.5 C  SpO2: 95% 100%    Last Pain:  Vitals:   08/26/18 1225  TempSrc:   PainSc: 0-No pain                 Lidia Collum

## 2018-08-26 NOTE — Op Note (Signed)
[facility] Patient Name: [name] [name] Procedure Date : 08/26/2018 MRN: [id number] Attending MD: Estill Cotta. Loletha Carrow , MD Date of Birth: Jan 02, 1951 CSN: [id number] Age: 67 Admit Type: Inpatient Procedure:                Upper GI endoscopy Indications:              Iron deficiency anemia due to suspected upper                            gastrointestinal bleeding, Melena Providers:                Mallie Mussel L. Loletha Carrow, MD, Burtis Junes, RN, Cletis Athens,                            Technician, Lavona Mound, CRNA Referring MD:              Medicines:                Monitored Anesthesia Care Complications:            No immediate complications. Estimated Blood Loss:     Estimated blood loss: none. Procedure:                Pre-Anesthesia Assessment:                           - Prior to the procedure, a History and Physical                            was performed, and patient medications and                            allergies were reviewed. The patient's tolerance of                            previous anesthesia was also reviewed. The risks                            and benefits of the procedure and the sedation                            options and risks were discussed with the patient.                            All questions were answered, and informed consent                            was obtained. Prior Anticoagulants: The patient has                            taken no previous anticoagulant or antiplatelet                            agents. ASA Grade Assessment: III - A patient with  severe systemic disease. After reviewing the risks                            and benefits, the patient was deemed in                            satisfactory condition to undergo the procedure.                           After obtaining informed consent, the endoscope was                            passed under direct vision. Throughout the   procedure, the patient's blood pressure, pulse, and                            oxygen saturations were monitored continuously. The                            GIF-H190 ([id number]) Olympus Adult EGD was introduced                            through the mouth, and advanced to the second part                            of duodenum. The upper GI endoscopy was                            accomplished without difficulty. The patient                            tolerated the procedure well. Scope In: Scope Out: Findings:      The cardia and gastric fundus were normal on retroflexion.      The examined duodenum was normal.      One benign-appearing, intrinsic mild stenosis was found at the       gastroesophageal junction. This stenosis measured 1.6 cm (inner       diameter) x less than one cm (in length). The stenosis was traversed.      Localized hemorrhagic mucosa was found in the gastric fundus (a small       area with a few flecks of adherent blood - most likely from chronic       aspirin/exedrin use). Impression:               - Normal esophagus.                           - Normal stomach.                           - Normal examined duodenum.                           - No specimens collected. Recommendation:           - Return patient to hospital ward for possible  discharge same day.                           - Resume regular diet.                           - Oral iron sulfate to replete hemoglobin in next                            several weeks.                           Outpatient colonoscopy in near future. Procedure Code(s):        --- Professional ---                           [id number], Esophagogastroduodenoscopy, flexible,                            transoral; diagnostic, including collection of                            specimen(s) by brushing or washing, when performed                            (separate procedure) Diagnosis Code(s):        ---  Professional ---                           D50.9, Iron deficiency anemia, unspecified                           K92.1, Melena (includes Hematochezia) CPT copyright 2018 American Medical Association. All rights reserved. The codes documented in this report are preliminary and upon coder review may  be revised to meet current compliance requirements.  L. Loletha Carrow, MD 08/26/2018 12:12:32 PM This report has been signed electronically. Number of Addenda: 0

## 2018-08-26 NOTE — Progress Notes (Signed)
Pt given discharge instructions, prescriptions, and care notes. Pt verbalized understanding AEB no further questions or concerns at this time. IV was discontinued, no redness, pain, or swelling noted at this time. Pt left the floor in stable condition. 

## 2018-08-26 NOTE — Interval H&P Note (Signed)
History and Physical Interval Note:  08/26/2018 11:47 AM  [name] [name]  has presented today for surgery, with the diagnosis of anemia  The various methods of treatment have been discussed with the patient and family. After consideration of risks, benefits and other options for treatment, the patient has consented to  Procedure(s): ESOPHAGOGASTRODUODENOSCOPY (EGD) (N/A) as a surgical intervention .  The patient's history has been reviewed, patient examined, no change in status, stable for surgery.  I have reviewed the patient's chart and labs.  Questions were answered to the patient's satisfaction.    Saw patient in room earlier this AM.  Looking well, breathing comfortably.  Hgb > 9 after transfusion.  Still looks well in pre-op area. Anesthesia MD has seen - proceeding with EGD   [name]

## 2018-08-26 NOTE — Transfer of Care (Signed)
Immediate Anesthesia Transfer of Care Note  Patient: [name] [name]  Procedure(s) Performed: ESOPHAGOGASTRODUODENOSCOPY (EGD) (N/A )  Patient Location: Endoscopy Unit  Anesthesia Type:MAC  Level of Consciousness: drowsy and patient cooperative  Airway & Oxygen Therapy: Patient Spontanous Breathing and Patient connected to nasal cannula oxygen  Post-op Assessment: Report given to RN and Post -op Vital signs reviewed and stable  Post vital signs: Reviewed and stable  Last Vitals:  Vitals Value Taken Time  BP    Temp    Pulse    Resp    SpO2      Last Pain:  Vitals:   08/26/18 1123  TempSrc: Oral  PainSc: 0-No pain         Complications: No apparent anesthesia complications

## 2018-08-27 ENCOUNTER — Encounter (HOSPITAL_COMMUNITY): Payer: Self-pay | Admitting: Gastroenterology

## 2018-08-29 ENCOUNTER — Ambulatory Visit: Payer: Medicare Other | Admitting: Internal Medicine

## 2018-08-29 ENCOUNTER — Encounter: Payer: Self-pay | Admitting: Internal Medicine

## 2018-08-29 DIAGNOSIS — D649 Anemia, unspecified: Secondary | ICD-10-CM

## 2018-08-29 DIAGNOSIS — J45909 Unspecified asthma, uncomplicated: Secondary | ICD-10-CM | POA: Diagnosis not present

## 2018-08-29 MED ORDER — LEVALBUTEROL HCL 1.25 MG/3ML IN NEBU: 1.2500 mg | INHALATION_SOLUTION | RESPIRATORY_TRACT | 12 refills | Status: DC | PRN

## 2018-08-29 MED ORDER — EPINEPHRINE 0.3 MG/0.3ML IJ SOAJ: 0.3000 mg | Freq: Once | INTRAMUSCULAR | 12 refills | Status: AC

## 2018-08-29 NOTE — Patient Instructions (Signed)
After discussion, we have refilled neb solution which you find you can take.  We have refilled Epipen with understanding that you will have to decide at the time of use whether benefit exceeds risk.  Please call if we can help

## 2018-08-29 NOTE — Assessment & Plan Note (Signed)
She feels somewhat weak and easily dyspneic now, but she is comfortable that this is purely due to her known anemia does not feel any other issues are requiring attention.

## 2018-08-29 NOTE — Progress Notes (Signed)
Patient ID: [name] [name], female    DOB: 03-19-1951, 67 y.o.   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  She had ruptured breast implants removed this year and hopes that will mean less allergies/hypersensitivity like problems for her.  Has found that she can tolerate Levaquin. Very little asthma in the past year.  Using her Xopenex rescue inhaler less than once a week and rarely needing Dulera, no sleep disturbance.  Takes her nebulizer machine on trips but has not needed to use it much.  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 Pt states something else besides low hemoglobin level caused the asthma attack. Colonoscopy set for January 2020.  She apparently presented to ER for anemia with black tarry stools, received PRBC transfusion and had an allergic reaction requiring CODE BLUE and transferred to ICU.  Responded to IV steroids and IV benadryl.  EGD was negative.  There was no hemolysis and no antibody problems identified by blood bank, she had no angioedema or throat swelling.  Pulmonary consultant at the hospital felt this was best characterized as an allergic/asthma attack rather than a transfusion reaction.  She  suspects trace of a medication that she is sensitive to may have been in the unit of blood. Currently she feels somewhat weak and dyspneic with exertion which she associates with her anemia.  She denies cough, wheeze or chest discomfort. She asks refill of Xopenex nebulizer solution (tolerates Xopenex but not albuterol).  Has a Dulera inhaler that she feels safe using.  Asks for refill of EpiPen.  She is used EpiPen in the past.  Her chart lists allergy to epinephrine but again she thinks the issue is sulfite preservative and that the epinephrine is likely to overcome any allergic reaction from the sulfite.  She chooses to keep the EpiPen available and to use it at time of need based on her best judgment of risk benefit under the circumstances.  ROS-see HPI  + = positive Constitutional:   No-   weight loss, night sweats, fevers, chills, fatigue, lassitude. HEENT:   No-  headaches, difficulty swallowing, tooth/dental problems, sore throat,       No-  sneezing, itching, ear ache, nasal congestion, post nasal drip,  CV:  No-   chest pain, orthopnea, PND, swelling in lower extremities, anasarca,  dizziness, palpitations Resp: +  shortness of breath with exertion or at rest.              No-   productive cough,  non-productive cough,  No- coughing up of blood.              No-   change in color of mucus.  + wheezing.   Skin: No-   rash or lesions. GI:  No-   heartburn, indigestion, abdominal pain, nausea, vomiting, See HPI GU:  MS:  No-   joint pain or swelling. Neuro-     nothing unusual Psych:  No- change in mood or affect. No depression or anxiety.  No memory loss.   Objective:   Physical Exam General- Alert, Oriented, Affect-appropriate, Distress- none acute Skin- rash-none, lesions- none, excoriation- none Lymphadenopathy- none Head- atraumatic            Eyes- Gross vision intact, PERRLA, conjunctivae clear secretions            Ears- Hearing, canals            Nose-  No-Septal dev,  mucus- clear, no-polyps, erosion, perforation             Throat- Mallampati II , mucosa red , drainage- none, tonsils- atrophic , + active gag.  Neck- flexible , trachea midline, no stridor , thyroid nl, carotid no bruit Chest - symmetrical excursion , unlabored           Heart/CV- RRR , no murmur , no gallop  , no rub, nl s1 s2                           - JVD- none , edema- none, stasis changes- none, varices- none           Lung- clear to P&A, wheeze- none, cough- none , dullness-none, rub- none           Chest wall-  Abd-  Br/ Gen/ Rectal- Not done, not indicated Extrem- cyanosis- none, clubbing, none, atrophy- none, strength- nl Neuro- grossly intact to observation

## 2018-08-29 NOTE — Assessment & Plan Note (Signed)
Currently clear and stable.  She had what was interpreted as allergic asthma episode during a blood transfusion.  She has a Dulera sample she feels comfortable using, Xopenex nebulizer solution.  She chooses to keep an EpiPen available, feeling that her reactions are due to sensitivity to sulfite preservative and noting that she has been unable to locate a manufacturer of sulfite free epinephrine after a previous manufacturer went out of business.

## 2018-08-30 ENCOUNTER — Telehealth: Payer: Self-pay | Admitting: Gastroenterology

## 2018-08-30 NOTE — Telephone Encounter (Signed)
[name] [name],  This patient was hospitalized last week for severe iron deficiency anemia.  EGD normal.  Please arrange nurse visit to schedule colonoscopy in the [place] for mid January.  She will be following up with primary care to recheck her hemoglobin.  Dr. Jenny Reichmann,  [facility]

## 2018-08-30 NOTE — Telephone Encounter (Signed)
Pt scheduled to see Ellouise Newer PA 09/02/18@2 :15pm, pt aware of appt.

## 2018-08-31 ENCOUNTER — Ambulatory Visit (INDEPENDENT_AMBULATORY_CARE_PROVIDER_SITE_OTHER): Payer: Medicare Other | Admitting: Internal Medicine

## 2018-08-31 ENCOUNTER — Other Ambulatory Visit (INDEPENDENT_AMBULATORY_CARE_PROVIDER_SITE_OTHER): Payer: Medicare Other

## 2018-08-31 ENCOUNTER — Encounter: Payer: Self-pay | Admitting: Internal Medicine

## 2018-08-31 VITALS — BP 112/72 | HR 97 | Temp 98.4°F | Ht 63.0 in | Wt 144.0 lb

## 2018-08-31 DIAGNOSIS — K921 Melena: Secondary | ICD-10-CM

## 2018-08-31 DIAGNOSIS — R062 Wheezing: Secondary | ICD-10-CM

## 2018-08-31 DIAGNOSIS — D649 Anemia, unspecified: Secondary | ICD-10-CM

## 2018-08-31 DIAGNOSIS — H6123 Impacted cerumen, bilateral: Secondary | ICD-10-CM

## 2018-08-31 LAB — BASIC METABOLIC PANEL
BUN: 23 mg/dL (ref 6–23)
CO2: 31 mEq/L (ref 19–32)
Calcium: 10.3 mg/dL (ref 8.4–10.5)
Chloride: 100 mEq/L (ref 96–112)
Creatinine, Ser: 1.11 mg/dL (ref 0.40–1.20)
GFR: 51.68 mL/min — ABNORMAL LOW (ref >60.00)
Glucose, Bld: 94 mg/dL (ref 70–99)
Potassium: 4 mEq/L (ref 3.5–5.1)
SODIUM: 140 meq/L (ref 135–145)

## 2018-08-31 LAB — CBC WITH DIFFERENTIAL/PLATELET
Basophils Absolute: 0 10*3/uL (ref 0.0–0.1)
Basophils Relative: 0.5 % (ref 0.0–3.0)
Eosinophils Absolute: 0.3 10*3/uL (ref 0.0–0.7)
Eosinophils Relative: 3.4 % (ref 0.0–5.0)
HCT: 39.4 % (ref 36.0–46.0)
Hemoglobin: 13 g/dL (ref 12.0–15.0)
Lymphocytes Relative: 28.5 % (ref 12.0–46.0)
Lymphs Abs: 2.5 10*3/uL (ref 0.7–4.0)
MCHC: 33 g/dL (ref 30.0–36.0)
MCV: 85.2 fl (ref 78.0–100.0)
Monocytes Absolute: 0.6 10*3/uL (ref 0.1–1.0)
Monocytes Relative: 7.3 % (ref 3.0–12.0)
NEUTROS PCT: 60.3 % (ref 43.0–77.0)
Neutro Abs: 5.3 10*3/uL (ref 1.4–7.7)
Platelets: 753 10*3/uL — ABNORMAL HIGH (ref 150.0–400.0)
RBC: 4.62 Mil/uL (ref 3.87–5.11)
RDW: 16 % — ABNORMAL HIGH (ref 11.5–15.5)
WBC: 8.8 10*3/uL (ref 4.0–10.5)

## 2018-08-31 NOTE — Assessment & Plan Note (Signed)
Also to f/u with GI as planned, continue same tx

## 2018-08-31 NOTE — Assessment & Plan Note (Signed)
Resolved,  to f/u any worsening symptoms or concerns  

## 2018-08-31 NOTE — Progress Notes (Signed)
Subjective:    Patient ID: [name] [name], female    DOB: Feb 13, 1951, 67 y.o.   MRN: [id number]  HPI  Here to f/u recent hospn with UGI bleed and symptomatic anemia, S/p 2 u prbc and EGD per Dr Loletha Carrow, complicated by an episode of bronchospasm for unclear reasons requiring ICU stay  . No further overt bleeding.  No melena  .Denies worsening reflux, abd pain, dysphagia, n/v, bowel change or blood.   Pt denies chest pain, increased sob or doe, wheezing, orthopnea, PND, increased LE swelling, palpitations, dizziness or syncope.  Pt denies new neurological symptoms such as new headache, or facial or extremity weakness or numbness   Pt denies polydipsia, polyuria. Denies urinary symptoms such as dysuria, frequency, urgency, flank pain, hematuria or n/v, fever, chills.   Pt denies fever, wt loss, night sweats, loss of appetite, or other constitutional symptoms Wt Readings from Last 3 Encounters:  08/31/18 144 lb (65.3 kg)  08/29/18 149 lb (67.6 kg)  08/26/18 148 lb 14.4 oz (67.5 kg)   Past Medical History:  Diagnosis Date  . ASTHMA   . BREAST CANCER, HX OF 11/1979  . GERD   . Irritable bowel syndrome   . MIGRAINE HEADACHE   . Symptomatic anemia 08/24/2018   Past Surgical History:  Procedure Laterality Date  . ABDOMINAL HYSTERECTOMY  11/1979  . BREAST SURGERY    . ESOPHAGOGASTRODUODENOSCOPY N/A 08/26/2018   Procedure: ESOPHAGOGASTRODUODENOSCOPY (EGD);  Surgeon: Doran Stabler, MD;  Location: [facility];  Service: Gastroenterology;  Laterality: N/A;  . Left wrist  1963  . MASTECTOMY  11/1979   Bilaterally & implants  . TONSILLECTOMY  1955  . TUBAL LIGATION  1975    reports that she has never smoked. She has never used smokeless tobacco. She reports current alcohol use. She reports that she does not use drugs. family history includes Breast cancer in her mother; Colon polyps (age of onset: 84) in her sister; Coronary artery disease in her mother; Coronary artery disease (age of onset: 58)  in her father; Endometriosis in her sister; Hyperlipidemia in her brother; Hypertension in her brother; Irritable bowel syndrome in her sister; Uterine cancer in her mother. Allergies  Allergen Reactions  . Albuterol Sulfate Anaphylaxis    Pt states she has tolerated levalbuterol  . Amoxicillin-Pot Clavulanate     REACTION: ANAPHYLACTIC REACTION  Lips swell   . Epipen [Epinephrine] Other (See Comments)    Allergic to preservatives in Epipen with caution!!!  . Fluzone     REACTION: ANAPHYLACTIC  . Influenza Vaccines Anaphylaxis  . Morphine     REACTION: ANAPHYLACTIC REACTION  . Povidone-Iodine     REACTION: HIVES/ANAPHYLACTIC WITH INJECTABLE DYE  . Sulfonamide Derivatives     REACTION: ANAPHYLACTIC REACTION  . Atrovent [Ipratropium] Other (See Comments)    Has sulfa in preservative  . Ciprofloxacin     Itchy palms  . Hyoscyamine Sulfate Hives  . Meperidine Hcl     REACTION: N\T\V  . Other     Mucomist-unable to breathe  . Pepcid [Famotidine] Other (See Comments)  . Pneumococcal Vaccines     Pt declines  . Ppd [Tuberculin Purified Protein Derivative]     Arm swelling with red streaks  . Reglan [Metoclopramide] Other (See Comments)  . Zostavax [Zoster Vaccine Live]     Pt declines   Current Outpatient Medications on File Prior to Visit  Medication Sig Dispense Refill  . Ascorbic Acid (VITAMIN C) 1000 MG tablet Take 1,000  mg by mouth daily.    Marland Kitchen aspirin-acetaminophen-caffeine (EXCEDRIN MIGRAINE) 250-250-65 MG per tablet Take 1 tablet by mouth every 6 (six) hours as needed. For migraine    . Calcium Carbonate-Vit D-Min 600-200 MG-UNIT TABS Take 1 tablet by mouth 2 (two) times daily.    . Cyanocobalamin (VITAMIN B 12 PO) Take 1 tablet by mouth daily.    . cyclobenzaprine (FLEXERIL) 10 MG tablet Take 1 tablet (10 mg total) by mouth 3 (three) times daily. 60 tablet 1  . diphenhydrAMINE (BENADRYL) 25 MG tablet Take 25 mg by mouth every 8 (eight) hours as needed. For allergies      . ferrous sulfate 325 (65 FE) MG tablet Take 1 tablet (325 mg total) by mouth 2 (two) times daily with a meal. 60 tablet 0  . levalbuterol (XOPENEX HFA) 45 MCG/ACT inhaler Inhale 1-2 puffs into the lungs every 6 (six) hours as needed. For shortness of breath 1 Inhaler 2  . levalbuterol (XOPENEX) 1.25 MG/3ML nebulizer solution Take 1.25 mg by nebulization every 4 (four) hours as needed for wheezing. 72 mL 12  . Melatonin 10 MG CAPS Take 10 mg by mouth at bedtime.    . mometasone-formoterol (DULERA) 200-5 MCG/ACT AERO Inhale 2 puffs into the lungs 2 (two) times daily. Rinse mouth 1 Inhaler 2  . pantoprazole (PROTONIX) 40 MG tablet Take 1 tablet (40 mg total) by mouth daily. 90 tablet 3  . vitamin E 400 UNIT capsule Take 400 Units by mouth daily.      Marland Kitchen zolpidem (AMBIEN) 10 MG tablet TAKE 1 TABLET BY MOUTH AT BEDTIME (Patient taking differently: Take 10 mg by mouth at bedtime. ) 90 tablet 1   No current facility-administered medications on file prior to visit.    Review of Systems  Constitutional: Negative for other unusual diaphoresis or sweats HENT: Negative for ear discharge or swelling Eyes: Negative for other worsening visual disturbances Respiratory: Negative for stridor or other swelling  Gastrointestinal: Negative for worsening distension or other blood Genitourinary: Negative for retention or other urinary change Musculoskeletal: Negative for other MSK pain or swelling Skin: Negative for color change or other new lesions Neurological: Negative for worsening tremors and other numbness  Psychiatric/Behavioral: Negative for worsening agitation or other fatigue All other system neg per pt    Objective:   Physical Exam BP 112/72   Pulse 97   Temp 98.4 F (36.9 C) (Oral)   Ht 5\' 3"  (1.6 m)   Wt 144 lb (65.3 kg)   SpO2 94%   BMI 25.51 kg/m  VS noted, not ill appearing Constitutional: Pt appears in NAD HENT: Head: NCAT.  Right Ear: External ear normal.  Left Ear: External ear  normal.  Eyes: . Pupils are equal, round, and reactive to light. Conjunctivae and EOM are normal Nose: without d/c or deformity Neck: Neck supple. Gross normal ROM Cardiovascular: Normal rate and regular rhythm.   Pulmonary/Chest: Effort normal and breath sounds without rales or wheezing.  Abd:  Soft, NT, ND, + BS, no organomegaly Neurological: Pt is alert. At baseline orientation, motor grossly intact Skin: Skin is warm. No rashes, other new lesions, no LE edema Psychiatric: Pt behavior is normal without agitation  No other exam findings Lab Results  Component Value Date   WBC 11.3 (H) 08/26/2018   HGB 9.3 (L) 08/26/2018   HCT 30.1 (L) 08/26/2018   PLT 541 (H) 08/26/2018   GLUCOSE 160 (H) 08/25/2018   CHOL 199 02/08/2018   TRIG 220.0 (H)  02/08/2018   HDL 62.00 02/08/2018   LDLDIRECT 72.0 02/08/2018   LDLCALC 133 (H) 02/03/2017   ALT 14 08/24/2018   AST 21 08/24/2018   NA 143 08/25/2018   K 3.6 08/25/2018   CL 110 08/25/2018   CREATININE 1.10 (H) 08/25/2018   BUN 11 08/25/2018   CO2 21 (L) 08/25/2018   TSH 3.86 02/08/2018   INR 1.0 08/24/2018       Assessment & Plan:

## 2018-08-31 NOTE — Assessment & Plan Note (Signed)
symptomatically improved, for f/u labs

## 2018-08-31 NOTE — Patient Instructions (Signed)
Your ears were irrigated of wax impactions today  Please continue all other medications as before, and refills have been done if requested.  Please have the pharmacy call with any other refills you may need.  Please continue your efforts at being more active, low cholesterol diet, and weight control.  Please keep your appointments with your specialists as you may have planned - Dr Loletha Carrow or PA next week  Please go to the LAB in the Basement (turn left off the elevator) for the tests to be done today  You will be contacted by phone if any changes need to be made immediately.  Otherwise, you will receive a letter about your results with an explanation, but please check with MyChart first.  Please remember to sign up for MyChart if you have not done so, as this will be important to you in the future with finding out test results, communicating by private email, and scheduling acute appointments online when needed.

## 2018-09-02 ENCOUNTER — Ambulatory Visit: Payer: Medicare Other | Admitting: Physician Assistant

## 2018-09-02 ENCOUNTER — Telehealth: Payer: Self-pay | Admitting: Gastroenterology

## 2018-09-02 ENCOUNTER — Encounter: Payer: Self-pay | Admitting: Physician Assistant

## 2018-09-02 VITALS — BP 90/72 | HR 84 | Ht 63.0 in | Wt 147.0 lb

## 2018-09-02 DIAGNOSIS — D509 Iron deficiency anemia, unspecified: Secondary | ICD-10-CM | POA: Diagnosis not present

## 2018-09-02 MED ORDER — PEG-KCL-NACL-NASULF-NA ASC-C 140 G PO SOLR: 1.0000 | Freq: Once | ORAL | 0 refills | Status: AC

## 2018-09-02 NOTE — Progress Notes (Signed)
Thank you for sending this case to me. I have reviewed the entire note, and the outlined plan is what we discussed.   Wilfrid Lund, MD

## 2018-09-02 NOTE — Patient Instructions (Addendum)
You have been scheduled for a colonoscopy. Please follow written instructions given to you at your visit today.  Please pick up your prep supplies at the pharmacy within the next 1-3 days. [place], [place]. If you use inhalers (even only as needed), please bring them with you on the day of your procedure.  Normal BMI (Body Mass Index- based on height and weight) is between 23 and 30. Your BMI today is Body mass index is 26.04 kg/m. Marland Kitchen Please consider follow up  regarding your BMI with your Primary Care Provider.

## 2018-09-02 NOTE — Telephone Encounter (Signed)
Pt pharm called and advised that the prep was not covered by pt insurance

## 2018-09-02 NOTE — Telephone Encounter (Signed)
Called the patient to advise we can give her a prep sample since there insurance wont cover the prep. Tried to leave a message and the mailbox is full.

## 2018-09-02 NOTE — Progress Notes (Signed)
Chief Complaint: Hospital follow-up for iron deficiency anemia  HPI:    [name] [name] is a 67 year old female with a past medical history as listed below, who presents to clinic today for follow-up after recent being in the hospital for iron deficiency anemia.     08/24/2018 consult by our service for severe anemia and concerns of GI bleed.  Described melena at that time.  Patient was transfused 2 units PRBCs during hospitalization.  EGD 08/26/2018 with Dr. Loletha Carrow which was completely normal.  Hemoglobin at time of admission was 7.2 (7.5 two days prior).  Patient was placed on iron and told to follow-up in our outpatient clinic for colonoscopy.    08/31/2018 CBC with a hemoglobin normal at 13.0.    Today, the patient tells me that she is doing well.  In fact she stopped her iron after her hemoglobin returned to normal recently but she tells me she "does not trust the result because she has been a nurse for years and cannot believe her hemoglobin would jump from a 9 something at discharge to a 13 just a week later".  Patient denies any abdominal discomfort or continued black stools.    Patient does tell me that she "reacts weirdly to things" and is somewhat apprehensive about the anesthesia and prep but tells me that as long as we use the same anesthetic that she had in the hospital she should be fine.    Denies fever, chills, weight loss, anorexia, nausea, vomiting, change in bowel habits, heartburn, reflux or symptoms that awaken her from sleep.  Past Medical History:  Diagnosis Date  . ASTHMA   . BREAST CANCER, HX OF 11/1979  . GERD   . Irritable bowel syndrome   . MIGRAINE HEADACHE   . Symptomatic anemia 08/24/2018    Past Surgical History:  Procedure Laterality Date  . ABDOMINAL HYSTERECTOMY  11/1979  . BREAST SURGERY    . ESOPHAGOGASTRODUODENOSCOPY N/A 08/26/2018   Procedure: ESOPHAGOGASTRODUODENOSCOPY (EGD);  Surgeon: Doran Stabler, MD;  Location: [facility];  Service:  Gastroenterology;  Laterality: N/A;  . Left wrist  1963  . MASTECTOMY  11/1979   Bilaterally & implants  . TONSILLECTOMY  1955  . TUBAL LIGATION  1975    Current Outpatient Medications  Medication Sig Dispense Refill  . Ascorbic Acid (VITAMIN C) 1000 MG tablet Take 1,000 mg by mouth daily.    Marland Kitchen aspirin-acetaminophen-caffeine (EXCEDRIN MIGRAINE) 250-250-65 MG per tablet Take 1 tablet by mouth every 6 (six) hours as needed. For migraine    . Calcium Carbonate-Vit D-Min 600-200 MG-UNIT TABS Take 1 tablet by mouth 2 (two) times daily.    . Cyanocobalamin (VITAMIN B 12 PO) Take 1 tablet by mouth daily.    . cyclobenzaprine (FLEXERIL) 10 MG tablet Take 1 tablet (10 mg total) by mouth 3 (three) times daily. 60 tablet 1  . diphenhydrAMINE (BENADRYL) 25 MG tablet Take 25 mg by mouth every 8 (eight) hours as needed. For allergies    . ferrous sulfate 325 (65 FE) MG tablet Take 1 tablet (325 mg total) by mouth 2 (two) times daily with a meal. 60 tablet 0  . levalbuterol (XOPENEX HFA) 45 MCG/ACT inhaler Inhale 1-2 puffs into the lungs every 6 (six) hours as needed. For shortness of breath 1 Inhaler 2  . levalbuterol (XOPENEX) 1.25 MG/3ML nebulizer solution Take 1.25 mg by nebulization every 4 (four) hours as needed for wheezing. 72 mL 12  . Melatonin 10 MG CAPS Take 10  mg by mouth at bedtime.    . mometasone-formoterol (DULERA) 200-5 MCG/ACT AERO Inhale 2 puffs into the lungs 2 (two) times daily. Rinse mouth 1 Inhaler 2  . pantoprazole (PROTONIX) 40 MG tablet Take 1 tablet (40 mg total) by mouth daily. 90 tablet 3  . vitamin E 400 UNIT capsule Take 400 Units by mouth daily.      Marland Kitchen zolpidem (AMBIEN) 10 MG tablet TAKE 1 TABLET BY MOUTH AT BEDTIME (Patient taking differently: Take 10 mg by mouth at bedtime. ) 90 tablet 1   No current facility-administered medications for this visit.     Allergies as of 09/02/2018 - Review Complete 08/31/2018  Allergen Reaction Noted  . Albuterol sulfate Anaphylaxis  04/02/2011  . Amoxicillin-pot clavulanate    . Epipen [epinephrine] Other (See Comments) 01/09/2016  . Fluzone  06/15/2007  . Influenza vaccines Anaphylaxis 11/27/2011  . Morphine    . Povidone-iodine    . Sulfonamide derivatives    . Atrovent [ipratropium] Other (See Comments) 08/22/2015  . Ciprofloxacin  11/29/2012  . Hyoscyamine sulfate Hives 06/12/2011  . Meperidine hcl    . Other  11/29/2012  . Pepcid [famotidine] Other (See Comments) 08/22/2015  . Pneumococcal vaccines  08/23/2013  . Ppd [tuberculin purified protein derivative]  01/09/2015  . Reglan [metoclopramide] Other (See Comments) 08/22/2015  . Zostavax [zoster vaccine live]  08/23/2013    Family History  Problem Relation Age of Onset  . Coronary artery disease Father 45       CABG age 52  . Breast cancer Mother   . Uterine cancer Mother   . Coronary artery disease Mother   . Endometriosis Sister   . Colon polyps Sister 65       1/2 sister - same dad  . Irritable bowel syndrome Sister        1/2 sister, C prone  . Hyperlipidemia Brother        1/2 bro, same dad  . Hypertension Brother        1/2 bro, same dad    Social History   Socioeconomic History  . Marital status: Widowed    Spouse name: Not on file  . Number of children: Not on file  . Years of education: Not on file  . Highest education level: Not on file  Occupational History  . Not on file  Social Needs  . Financial resource strain: Not on file  . Food insecurity:    Worry: Not on file    Inability: Not on file  . Transportation needs:    Medical: Not on file    Non-medical: Not on file  Tobacco Use  . Smoking status: Never Smoker  . Smokeless tobacco: Never Used  Substance and Sexual Activity  . Alcohol use: Yes    Alcohol/week: 0.0 standard drinks    Comment: occ  . Drug use: No  . Sexual activity: Not on file  Lifestyle  . Physical activity:    Days per week: Not on file    Minutes per session: Not on file  . Stress: Not on  file  Relationships  . Social connections:    Talks on phone: Not on file    Gets together: Not on file    Attends religious service: Not on file    Active member of club or organization: Not on file    Attends meetings of clubs or organizations: Not on file    Relationship status: Not on file  . Intimate partner violence:  Fear of current or ex partner: Not on file    Emotionally abused: Not on file    Physically abused: Not on file    Forced sexual activity: Not on file  Other Topics Concern  . Not on file  Social History Narrative   Married, lives at home with spouse.    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 Lungs clear to auscultation bilaterally.   No wheezes, crackles, or rhonchi.  Cardiovascular: Normal S1, S2. No MRG. Regular rate and rhythm. No peripheral edema, cyanosis or pallor.  Gastrointestinal:  Soft, nondistended, nontender. No rebound or guarding. Normal bowel sounds. No appreciable masses or hepatomegaly. Psychiatric:  Demonstrates good judgement and reason without abnormal affect or behaviors.  MOST RECENT LABS AND IMAGING: CBC    Component Value Date/Time   WBC 8.8 08/31/2018 1056   RBC 4.62 08/31/2018 1056   HGB 13.0 08/31/2018 1056   HCT 39.4 08/31/2018 1056   HCT 20.4 (L) 08/24/2018 1625   PLT 753.0 (H) 08/31/2018 1056   MCV 85.2 08/31/2018 1056   MCH 27.0 08/26/2018 0351   MCHC 33.0 08/31/2018 1056   RDW 16.0 (H) 08/31/2018 1056   LYMPHSABS 2.5 08/31/2018  1056   MONOABS 0.6 08/31/2018 1056   EOSABS 0.3 08/31/2018 1056   BASOSABS 0.0 08/31/2018 1056    CMP     Component Value Date/Time   NA 140 08/31/2018 1056   K 4.0 08/31/2018 1056   CL 100 08/31/2018 1056   CO2 31 08/31/2018 1056   GLUCOSE 94 08/31/2018 1056   BUN 23 08/31/2018 1056   CREATININE 1.11 08/31/2018 1056   CALCIUM 10.3 08/31/2018 1056   PROT 6.4 (L) 08/24/2018 1805   ALBUMIN 3.6 08/24/2018 1805   AST 21 08/24/2018 1805   ALT 14 08/24/2018 1805   ALKPHOS 56 08/24/2018 1805   BILITOT 0.6 08/24/2018 1805   GFRNONAA 51 (L) 08/25/2018 0306   GFRAA 59 (L) 08/25/2018 0306    Assessment: 1.  Iron deficiency anemia: EGD recently normal in the hospital, hemoglobin now returned to normal, is recommended she have a colonoscopy  Plan: 1.  Scheduled patient for colonoscopy in the [place] with Dr. Loletha Carrow for iron deficiency anemia.  Did discuss risks, benefits, limitations and alternatives and patient agrees to proceed. 2.  Patient to follow in clinic per recommendations from Dr. Loletha Carrow after time of procedure.  Ellouise Newer, PA-C [facility] 09/02/2018, 1:55 PM  Cc: Biagio Borg, MD

## 2018-09-06 ENCOUNTER — Encounter: Payer: Self-pay | Admitting: Gastroenterology

## 2018-09-08 NOTE — Telephone Encounter (Signed)
Called the patient to advise since the insurance wont cover the prep, I can give her a sample prep this time for the colonoscopy. She said she can come tomorrow Friday 09-09-2018 to pick it up.  She thanked me for helping her.

## 2018-09-16 ENCOUNTER — Encounter: Payer: Self-pay | Admitting: Gastroenterology

## 2018-09-16 ENCOUNTER — Telehealth: Payer: Self-pay | Admitting: Gastroenterology

## 2018-09-16 ENCOUNTER — Ambulatory Visit (AMBULATORY_SURGERY_CENTER): Payer: Medicare Other | Admitting: Gastroenterology

## 2018-09-16 VITALS — BP 104/54 | HR 93 | Temp 97.1°F | Resp 18 | Ht 63.0 in | Wt 147.0 lb

## 2018-09-16 DIAGNOSIS — D509 Iron deficiency anemia, unspecified: Secondary | ICD-10-CM | POA: Diagnosis not present

## 2018-09-16 DIAGNOSIS — K5730 Diverticulosis of large intestine without perforation or abscess without bleeding: Secondary | ICD-10-CM | POA: Diagnosis not present

## 2018-09-16 MED ORDER — SODIUM CHLORIDE 0.9 % IV SOLN: 500.0000 mL | Freq: Once | INTRAVENOUS | Status: DC

## 2018-09-16 NOTE — Progress Notes (Signed)
Report given to PACU, vss 

## 2018-09-16 NOTE — Progress Notes (Signed)
Pt's states no medical or surgical changes since previsit or office visit. 

## 2018-09-16 NOTE — Progress Notes (Signed)
No problems noted in the recovery room. maw 

## 2018-09-16 NOTE — Op Note (Signed)
[facility] Patient Name: [name] [name] Procedure Date: 09/16/2018 8:04 AM MRN: [id number] Endoscopist: [place]. Loletha Carrow , MD Age: 68 Referring MD:  Date of Birth: 1951/06/10 Gender: Female Account #: [id number] Procedure:                Colonoscopy Indications:              Unexplained iron deficiency anemia (recently normal                            EGD. patient had been taking aspirin (Exedrin)) Medicines:                Monitored Anesthesia Care Procedure:                Pre-Anesthesia Assessment:                           - Prior to the procedure, a History and Physical                            was performed, and patient medications and                            allergies were reviewed. The patient's tolerance of                            previous anesthesia was also reviewed. The risks                            and benefits of the procedure and the sedation                            options and risks were discussed with the patient.                            All questions were answered, and informed consent                            was obtained. Prior Anticoagulants: The patient has                            taken no previous anticoagulant or antiplatelet                            agents. ASA Grade Assessment: II - A patient with                            mild systemic disease. After reviewing the risks                            and benefits, the patient was deemed in                            satisfactory condition to undergo the procedure.  After obtaining informed consent, the colonoscope                            was passed under direct vision. Throughout the                            procedure, the patient's blood pressure, pulse, and                            oxygen saturations were monitored continuously. The                            Model PCF-[id number]) scope was introduced                            through the  anus and advanced approximately 10cm                            into the terminal ileum, with identification of the                            appendiceal orifice and IC valve. The colonoscopy                            was somewhat difficult due to multiple diverticula                            in the colon and a tortuous colon. The patient                            tolerated the procedure well. The quality of the                            bowel preparation was excellent. The terminal                            ileum, ileocecal valve, appendiceal orifice, and                            rectum were photographed. Scope In: 8:28:52 AM Scope Out: 8:43:39 AM Scope Withdrawal Time: 0 hours 8 minutes 46 seconds  Total Procedure Duration: 0 hours 14 minutes 47 seconds  Findings:                 The perianal and digital rectal examinations were                            normal.                           Multiple diverticula were found in the left colon                            and right colon.  Retroflexion in the rectum was not performed due to                            anatomy. There was mild rectal mucosal erythema                            from prep artifact.                           The exam was otherwise without abnormality. Complications:            No immediate complications. Estimated Blood Loss:     Estimated blood loss: none. Impression:               - Diverticulosis in the left colon and in the right                            colon.                           - The examination was otherwise normal.                           - No specimens collected. Recommendation:           - Patient has a contact number available for                            emergencies. The signs and symptoms of potential                            delayed complications were discussed with the                            patient. Return to normal activities tomorrow.                             Written discharge instructions were provided to the                            patient.                           - Resume previous diet.                           - Continue present medications.                           - No repeat screening colonoscopy due to current                            age (61 years or older) and the absence of colonic                            polyps.                           -  To visualize the small bowel, perform video                            capsule endoscopy at appointment to be scheduled.                           - Return to primary care physician to follow                            hemoglobin and manage iron supplement as needed. Aslan Himes L. Loletha Carrow, MD 09/16/2018 8:52:42 AM This report has been signed electronically.

## 2018-09-16 NOTE — Telephone Encounter (Signed)
[name] [name],    Please arrange small bowel video capsule study for this patient. Indication:  Iron deficiency anemia.

## 2018-09-16 NOTE — Patient Instructions (Signed)
YOU HAD AN ENDOSCOPIC PROCEDURE TODAY AT [facility]:   Refer to the procedure report that was given to you for any specific questions about what was found during the examination.  If the procedure report does not answer your questions, please call your gastroenterologist to clarify.  If you requested that your care partner not be given the details of your procedure findings, then the procedure report has been included in a sealed envelope for you to review at your convenience later.  YOU SHOULD EXPECT: Some feelings of bloating in the abdomen. Passage of more gas than usual.  Walking can help get rid of the air that was put into your GI tract during the procedure and reduce the bloating. If you had a lower endoscopy (such as a colonoscopy or flexible sigmoidoscopy) you may notice spotting of blood in your stool or on the toilet paper. If you underwent a bowel prep for your procedure, you may not have a normal bowel movement for a few days.  Please Note:  You might notice some irritation and congestion in your nose or some drainage.  This is from the oxygen used during your procedure.  There is no need for concern and it should clear up in a day or so.  SYMPTOMS TO REPORT IMMEDIATELY:   Following lower endoscopy (colonoscopy or flexible sigmoidoscopy):  Excessive amounts of blood in the stool  Significant tenderness or worsening of abdominal pains  Swelling of the abdomen that is new, acute  Fever of 100F or higher  For urgent or emergent issues, a gastroenterologist can be reached at any hour by calling [phone].   DIET:  We do recommend a small meal at first, but then you may proceed to your regular diet.  Drink plenty of fluids but you should avoid alcoholic beverages for 24 hours.  ACTIVITY:  You should plan to take it easy for the rest of today and you should NOT DRIVE or use heavy machinery until tomorrow (because of the sedation medicines used during the test).     FOLLOW UP: Our staff will call the number listed on your records the next business day following your procedure to check on you and address any questions or concerns that you may have regarding the information given to you following your procedure. If we do not reach you, we will leave a message.  However, if you are feeling well and you are not experiencing any problems, there is no need to return our call.  We will assume that you have returned to your regular daily activities without incident.  If any biopsies were taken you will be contacted by phone or by letter within the next 1-3 weeks.  Please call us at [phone] if you have not heard about the biopsies in 3 weeks.    SIGNATURES/CONFIDENTIALITY: You and/or your care partner have signed paperwork which will be entered into your electronic medical record.  These signatures attest to the fact that that the information above on your After Visit Summary has been reviewed and is understood.  Full responsibility of the confidentiality of this discharge information lies with you and/or your care-partner.    Handout was given to your care partner on diverticulosis. You may resume your current medications today. To visualize the small bowel, to be set up for a video capsule endoscopy.  The office will call you with this appointment.. Please call if any questions or concerns.

## 2018-09-19 ENCOUNTER — Telehealth: Payer: Self-pay

## 2018-09-19 NOTE — Telephone Encounter (Signed)
Left message for follow up, will try later this afternoon.

## 2018-09-19 NOTE — Telephone Encounter (Signed)
  Follow up Call-  Call back number 09/16/2018  Post procedure Call Back phone  # [phone]  Permission to leave phone message Yes  Some recent data might be hidden     Patient questions:  Do you have a fever, pain , or abdominal swelling? No. Pain Score  0 *  Have you tolerated food without any problems? Yes.    Have you been able to return to your normal activities? Yes.    Do you have any questions about your discharge instructions: Diet   No. Medications  No. Follow up visit  No.  Do you have questions or concerns about your Care? No.  Actions:  * If pain score is 4 or above:0 Physician/ provider Notified :

## 2018-09-21 NOTE — Telephone Encounter (Signed)
Pt scheduled for capsule endo 10/03/18@8am . Pt aware of prep and additional instructions sent to pt via mychart, she is aware. Ambulatory referral in epic.

## 2018-09-21 NOTE — Addendum Note (Signed)
Addended by: Rosanne Sack R on: 09/21/2018 04:01 PM   Modules accepted: Orders

## 2018-10-03 ENCOUNTER — Ambulatory Visit: Payer: Medicare Other | Admitting: Gastroenterology

## 2018-10-03 DIAGNOSIS — D509 Iron deficiency anemia, unspecified: Secondary | ICD-10-CM

## 2018-10-03 NOTE — Progress Notes (Signed)
Pt arrived for capsule but states her prep did not make her have a BM at all yesterday. Discussed with pt that she would need to do another prep and reschedule the procedure. Pt states she will not do another prep, she states she will not prep again, she has done enough.Patient does not want to reschedule the procedure. Dr. Loletha Carrow notified.

## 2018-11-21 ENCOUNTER — Other Ambulatory Visit: Payer: Self-pay | Admitting: Internal Medicine

## 2018-11-21 NOTE — Telephone Encounter (Signed)
Done erx 

## 2019-01-19 ENCOUNTER — Other Ambulatory Visit: Payer: Self-pay | Admitting: Internal Medicine

## 2019-01-19 DIAGNOSIS — R5383 Other fatigue: Secondary | ICD-10-CM

## 2019-02-10 ENCOUNTER — Ambulatory Visit (INDEPENDENT_AMBULATORY_CARE_PROVIDER_SITE_OTHER): Payer: Medicare Other | Admitting: Internal Medicine

## 2019-02-10 ENCOUNTER — Other Ambulatory Visit: Payer: Self-pay

## 2019-02-10 ENCOUNTER — Other Ambulatory Visit (INDEPENDENT_AMBULATORY_CARE_PROVIDER_SITE_OTHER): Payer: Medicare Other

## 2019-02-10 ENCOUNTER — Encounter: Payer: Self-pay | Admitting: Internal Medicine

## 2019-02-10 VITALS — BP 122/76 | HR 95 | Temp 97.7°F | Ht 63.0 in | Wt 148.0 lb

## 2019-02-10 DIAGNOSIS — Z0000 Encounter for general adult medical examination without abnormal findings: Secondary | ICD-10-CM

## 2019-02-10 DIAGNOSIS — D509 Iron deficiency anemia, unspecified: Secondary | ICD-10-CM

## 2019-02-10 DIAGNOSIS — E538 Deficiency of other specified B group vitamins: Secondary | ICD-10-CM

## 2019-02-10 DIAGNOSIS — R739 Hyperglycemia, unspecified: Secondary | ICD-10-CM

## 2019-02-10 DIAGNOSIS — K219 Gastro-esophageal reflux disease without esophagitis: Secondary | ICD-10-CM

## 2019-02-10 DIAGNOSIS — E559 Vitamin D deficiency, unspecified: Secondary | ICD-10-CM | POA: Diagnosis not present

## 2019-02-10 LAB — BASIC METABOLIC PANEL
BUN: 15 mg/dL (ref 6–23)
CO2: 26 mEq/L (ref 19–32)
Calcium: 10 mg/dL (ref 8.4–10.5)
Chloride: 104 mEq/L (ref 96–112)
Creatinine, Ser: 1.23 mg/dL — ABNORMAL HIGH (ref 0.40–1.20)
GFR: 43.14 mL/min — ABNORMAL LOW (ref >60.00)
Glucose, Bld: 95 mg/dL (ref 70–99)
Potassium: 4 mEq/L (ref 3.5–5.1)
Sodium: 141 mEq/L (ref 135–145)

## 2019-02-10 LAB — IBC PANEL
Iron: 65 ug/dL (ref 42–145)
Saturation Ratios: 13.9 % — ABNORMAL LOW (ref 20.0–50.0)
Transferrin: 333 mg/dL (ref 212.0–360.0)

## 2019-02-10 LAB — CBC WITH DIFFERENTIAL/PLATELET
Basophils Absolute: 0 10*3/uL (ref 0.0–0.1)
Basophils Relative: 0.3 % (ref 0.0–3.0)
Eosinophils Absolute: 0.4 10*3/uL (ref 0.0–0.7)
Eosinophils Relative: 5.3 % — ABNORMAL HIGH (ref 0.0–5.0)
HCT: 42.5 % (ref 36.0–46.0)
Hemoglobin: 14.4 g/dL (ref 12.0–15.0)
Lymphocytes Relative: 37.7 % (ref 12.0–46.0)
Lymphs Abs: 2.8 10*3/uL (ref 0.7–4.0)
MCHC: 33.9 g/dL (ref 30.0–36.0)
MCV: 82.1 fl (ref 78.0–100.0)
Monocytes Absolute: 0.6 10*3/uL (ref 0.1–1.0)
Monocytes Relative: 8.7 % (ref 3.0–12.0)
Neutro Abs: 3.5 10*3/uL (ref 1.4–7.7)
Neutrophils Relative %: 48 % (ref 43.0–77.0)
Platelets: 548 10*3/uL — ABNORMAL HIGH (ref 150.0–400.0)
RBC: 5.17 Mil/uL — ABNORMAL HIGH (ref 3.87–5.11)
RDW: 14.9 % (ref 11.5–15.5)
WBC: 7.3 10*3/uL (ref 4.0–10.5)

## 2019-02-10 LAB — HEMOGLOBIN A1C: Hgb A1c MFr Bld: 6.3 % (ref 4.6–6.5)

## 2019-02-10 LAB — HEPATIC FUNCTION PANEL
ALT: 16 U/L (ref 0–35)
AST: 19 U/L (ref 0–37)
Albumin: 4.5 g/dL (ref 3.5–5.2)
Alkaline Phosphatase: 63 U/L (ref 39–117)
Bilirubin, Direct: 0 mg/dL (ref 0.0–0.3)
Total Bilirubin: 0.4 mg/dL (ref 0.2–1.2)
Total Protein: 7.4 g/dL (ref 6.0–8.3)

## 2019-02-10 LAB — FERRITIN: Ferritin: 22 ng/mL (ref 10.0–291.0)

## 2019-02-10 LAB — TSH: TSH: 6.18 u[IU]/mL — ABNORMAL HIGH (ref 0.35–4.50)

## 2019-02-10 LAB — LIPID PANEL
Cholesterol: 238 mg/dL — ABNORMAL HIGH (ref 0–200)
HDL: 67.8 mg/dL (ref >39.00)
NonHDL: 170.42
Total CHOL/HDL Ratio: 4
Triglycerides: 247 mg/dL — ABNORMAL HIGH (ref 0.0–149.0)
VLDL: 49.4 mg/dL — ABNORMAL HIGH (ref 0.0–40.0)

## 2019-02-10 LAB — LDL CHOLESTEROL, DIRECT: Direct LDL: 75 mg/dL

## 2019-02-10 LAB — VITAMIN B12: Vitamin B-12: >1500 pg/mL — ABNORMAL HIGH (ref 211–911)

## 2019-02-10 LAB — VITAMIN D 25 HYDROXY (VIT D DEFICIENCY, FRACTURES): VITD: 80.21 ng/mL (ref 30.00–100.00)

## 2019-02-10 MED ORDER — PANTOPRAZOLE SODIUM 40 MG PO TBEC: 40.0000 mg | DELAYED_RELEASE_TABLET | Freq: Every day | ORAL | 3 refills | Status: DC

## 2019-02-10 NOTE — Patient Instructions (Signed)

## 2019-02-10 NOTE — Progress Notes (Signed)
Subjective:    Patient ID: [name] [name], female    DOB: [birth year], 68 y.o.   MRN: [id number]  HPI  Here for wellness and f/u;  Overall doing ok;  Pt denies Chest pain, worsening SOB, DOE, wheezing, orthopnea, PND, worsening LE edema, palpitations, dizziness or syncope.  Pt denies neurological change such as new headache, facial or extremity weakness.  Pt denies polydipsia, polyuria, or low sugar symptoms. Pt states overall good compliance with treatment and medications, good tolerability, and has been trying to follow appropriate diet.  Pt denies worsening depressive symptoms, suicidal ideation or panic. No fever, night sweats, wt loss, loss of appetite, or other constitutional symptoms.  Pt states good ability with ADL's, has low fall risk, home safety reviewed and adequate, no other significant changes in hearing or vision, and only occasionally active with exercise. Denies worsening reflux, abd pain, dysphagia, n/v, bowel change or blood. On protonx, plans to try to stop about dec 2020.  Past Medical History:  Diagnosis Date   ASTHMA    BREAST CANCER, HX OF 11/1979   GERD    Irritable bowel syndrome    MIGRAINE HEADACHE    Symptomatic anemia 08/24/2018   Past Surgical History:  Procedure Laterality Date   ABDOMINAL HYSTERECTOMY  11/1979   BREAST SURGERY     ESOPHAGOGASTRODUODENOSCOPY N/A 08/26/2018   Procedure: ESOPHAGOGASTRODUODENOSCOPY (EGD);  Surgeon: Doran Stabler, MD;  Location: [facility];  Service: Gastroenterology;  Laterality: N/A;   Left wrist  1963   MASTECTOMY  11/1979   Bilaterally & implants   OTHER SURGICAL HISTORY Bilateral    removal of breast implants   [place]    reports that she has never smoked. She has never used smokeless tobacco. She reports current alcohol use. She reports that she does not use drugs. family history includes Breast cancer in her mother; Colon polyps (age of onset: 89) in her sister;  Coronary artery disease in her mother; Coronary artery disease (age of onset: 37) in her father; Endometriosis in her sister; Heart disease in her brother; Hyperlipidemia in her brother; Hypertension in her brother; Irritable bowel syndrome in her sister; Uterine cancer in her mother. Allergies  Allergen Reactions   Albuterol Sulfate Anaphylaxis    Pt states she has tolerated levalbuterol   Amoxicillin-Pot Clavulanate     REACTION: ANAPHYLACTIC REACTION  Lips swell    Epipen [Epinephrine] Other (See Comments)    Allergic to preservatives in Epipen with caution!!!   Fluzone     REACTION: ANAPHYLACTIC   Influenza Vaccines Anaphylaxis   Morphine     REACTION: ANAPHYLACTIC REACTION   Povidone-Iodine     REACTION: HIVES/ANAPHYLACTIC WITH INJECTABLE DYE   Sulfonamide Derivatives     REACTION: ANAPHYLACTIC REACTION   Atrovent [Ipratropium] Other (See Comments)    Has sulfa in preservative   Ciprofloxacin     Itchy palms   Hyoscyamine Sulfate Hives   Meperidine Hcl     REACTION: N\T\V   Other     Mucomist-unable to breathe   Pepcid [Famotidine] Other (See Comments)   Pneumococcal Vaccines     Pt declines   Ppd [Tuberculin Purified Protein Derivative]     Arm swelling with red streaks   Reglan [Metoclopramide] Other (See Comments)   Zostavax [Zoster Vaccine Live]     Pt declines   Current Outpatient Medications on File Prior to Visit  Medication Sig Dispense Refill   Ascorbic Acid (VITAMIN  C) 1000 MG tablet Take 1,000 mg by mouth daily.     aspirin-acetaminophen-caffeine (EXCEDRIN MIGRAINE) 250-250-65 MG per tablet Take 1 tablet by mouth every 6 (six) hours as needed. For migraine     Calcium Carbonate-Vit D-Min 600-200 MG-UNIT TABS Take 1 tablet by mouth 2 (two) times daily.     Cyanocobalamin (VITAMIN B 12 PO) Take 1 tablet by mouth daily.     cyclobenzaprine (FLEXERIL) 10 MG tablet TAKE 1 TABLET BY MOUTH 3 TIMES DAILY 60 tablet 1   diphenhydrAMINE  (BENADRYL) 25 MG tablet Take 25 mg by mouth every 8 (eight) hours as needed. For allergies     levalbuterol (XOPENEX HFA) 45 MCG/ACT inhaler Inhale 1-2 puffs into the lungs every 6 (six) hours as needed. For shortness of breath 1 Inhaler 2   levalbuterol (XOPENEX) 1.25 MG/3ML nebulizer solution Take 1.25 mg by nebulization every 4 (four) hours as needed for wheezing. 72 mL 12   Melatonin 10 MG CAPS Take 10 mg by mouth at bedtime.     mometasone-formoterol (DULERA) 200-5 MCG/ACT AERO Inhale 2 puffs into the lungs 2 (two) times daily. Rinse mouth 1 Inhaler 2   vitamin E 400 UNIT capsule Take 400 Units by mouth daily.       zolpidem (AMBIEN) 10 MG tablet TAKE 1 TABLET BY MOUTH AT BEDTIME 90 tablet 1   No current facility-administered medications on file prior to visit.    Review of Systems Constitutional: Negative for other unusual diaphoresis, sweats, appetite or weight changes HENT: Negative for other worsening hearing loss, ear pain, facial swelling, mouth sores or neck stiffness.   Eyes: Negative for other worsening pain, redness or other visual disturbance.  Respiratory: Negative for other stridor or swelling Cardiovascular: Negative for other palpitations or other chest pain  Gastrointestinal: Negative for worsening diarrhea or loose stools, blood in stool, distention or other pain Genitourinary: Negative for hematuria, flank pain or other change in urine volume.  Musculoskeletal: Negative for myalgias or other joint swelling.  Skin: Negative for other color change, or other wound or worsening drainage.  Neurological: Negative for other syncope or numbness. Hematological: Negative for other adenopathy or swelling Psychiatric/Behavioral: Negative for hallucinations, other worsening agitation, SI, self-injury, or new decreased concentration All other system neg per pt    Objective:   Physical Exam BP 122/76    Pulse 95    Temp 97.7 F (36.5 C) (Oral)    Ht 5\' 3"  (1.6 m)    Wt 148  lb (67.1 kg)    SpO2 92%    BMI 26.22 kg/m  VS noted,  Constitutional: Pt is oriented to person, place, and time. Appears well-developed and well-nourished, in no significant distress and comfortable Head: Normocephalic and atraumatic  Eyes: Conjunctivae and EOM are normal. Pupils are equal, round, and reactive to light Right Ear: External ear normal without discharge Left Ear: External ear normal without discharge Nose: Nose without discharge or deformity Mouth/Throat: Oropharynx is without other ulcerations and moist  Neck: Normal range of motion. Neck supple. No JVD present. No tracheal deviation present or significant neck LA or mass Cardiovascular: Normal rate, regular rhythm, normal heart sounds and intact distal pulses.   Pulmonary/Chest: WOB normal and breath sounds without rales or wheezing  Abdominal: Soft. Bowel sounds are normal. NT. No HSM  Musculoskeletal: Normal range of motion. Exhibits no edema Lymphadenopathy: Has no other cervical adenopathy.  Neurological: Pt is alert and oriented to person, place, and time. Pt has normal reflexes. No  cranial nerve deficit. Motor grossly intact, Gait intact Skin: Skin is warm and dry. No rash noted or new ulcerations Psychiatric:  Has normal mood and affect. Behavior is normal without agitation No other exam findings Lab Results  Component Value Date   WBC 7.3 02/10/2019   HGB 14.4 02/10/2019   HCT 42.5 02/10/2019   PLT 548.0 (H) 02/10/2019   GLUCOSE 95 02/10/2019   CHOL 238 (H) 02/10/2019   TRIG 247.0 (H) 02/10/2019   HDL 67.80 02/10/2019   LDLDIRECT 75.0 02/10/2019   LDLCALC 133 (H) 02/03/2017   ALT 16 02/10/2019   AST 19 02/10/2019   NA 141 02/10/2019   K 4.0 02/10/2019   CL 104 02/10/2019   CREATININE 1.23 (H) 02/10/2019   BUN 15 02/10/2019   CO2 26 02/10/2019   TSH 6.18 (H) 02/10/2019   INR 1.0 08/24/2018   HGBA1C 6.3 02/10/2019      Assessment & Plan:

## 2019-02-11 ENCOUNTER — Encounter: Payer: Self-pay | Admitting: Internal Medicine

## 2019-02-11 NOTE — Assessment & Plan Note (Signed)
Ok to take PPI to end of year, then follow

## 2019-02-11 NOTE — Assessment & Plan Note (Signed)
For a1c with labs

## 2019-02-11 NOTE — Assessment & Plan Note (Signed)
For f/u iron level 

## 2019-02-11 NOTE — Assessment & Plan Note (Signed)

## 2019-05-29 DIAGNOSIS — H2513 Age-related nuclear cataract, bilateral: Secondary | ICD-10-CM | POA: Diagnosis not present

## 2019-05-29 DIAGNOSIS — H02834 Dermatochalasis of left upper eyelid: Secondary | ICD-10-CM | POA: Diagnosis not present

## 2019-05-29 DIAGNOSIS — H40013 Open angle with borderline findings, low risk, bilateral: Secondary | ICD-10-CM | POA: Diagnosis not present

## 2019-05-29 DIAGNOSIS — H02831 Dermatochalasis of right upper eyelid: Secondary | ICD-10-CM | POA: Diagnosis not present

## 2019-06-27 ENCOUNTER — Other Ambulatory Visit: Payer: Self-pay | Admitting: Internal Medicine

## 2019-06-27 NOTE — Progress Notes (Addendum)
Subjective:   [name] [name] is a 68 y.o. female who presents for Medicare Annual (Subsequent) preventive examination. I connected with patient by a telephone and verified that I am speaking with the correct person using two identifiers. Patient stated full name and DOB. Patient gave permission to continue with telephonic visit. Patient's location was at home and Nurse's location was at [place] office.   Review of Systems:   Cardiac Risk Factors include: advanced age (>96men, >26 women) Sleep patterns: feels rested on waking, gets up 0-1 times nightly to void and sleeps 6-7 hours nightly.    Home Safety/Smoke Alarms: Feels safe in ho   me. Smoke alarms in place.  Living environment; residence and Firearm Safety: 1-story house/ trailer. Lives with husband, no needs for DME, good support system Seat Belt Safety/Bike Helmet: Wears seat belt.    Objective:     Vitals: There were no vitals taken for this visit.  There is no height or weight on file to calculate BMI.  Advanced Directives 06/28/2019 08/25/2018 08/24/2018 08/22/2018 12/07/2015  Does Patient Have a Medical Advance Directive? Yes Yes Yes Yes No  Type of Paramedic of [place];Living will [place];Living will [place];Living will - -  Does patient want to make changes to medical advance directive? - No - Patient declined - No - Patient declined -  Copy of [place] in Chart? No - copy requested Yes - validated most recent copy scanned in chart (See row information) - - -    Tobacco Social History   Tobacco Use  Smoking Status Never Smoker  Smokeless Tobacco Never Used     Counseling given: Not Answered  Past Medical History:  Diagnosis Date  . ASTHMA   . BREAST CANCER, HX OF 11/1979  . GERD   . GI bleed 11/92, 11/93, 11/94   GI Bleed  . Irritable bowel syndrome   . MIGRAINE HEADACHE   . Symptomatic anemia 08/24/2018   Past Surgical  History:  Procedure Laterality Date  . ABDOMINAL HYSTERECTOMY  11/1979  . BREAST SURGERY    . ESOPHAGOGASTRODUODENOSCOPY N/A 08/26/2018   Procedure: ESOPHAGOGASTRODUODENOSCOPY (EGD);  Surgeon: Doran Stabler, MD;  Location: [facility];  Service: Gastroenterology;  Laterality: N/A;  . Left wrist  1963  . MASTECTOMY  11/1979   Bilaterally & implants  . OTHER SURGICAL HISTORY Bilateral    removal of breast implants  . TONSILLECTOMY  1955  . TUBAL LIGATION  1975   Family History  Problem Relation Age of Onset  . Coronary artery disease Father 61       CABG age 21  . Breast cancer Mother   . Uterine cancer Mother   . Coronary artery disease Mother   . Endometriosis Sister   . Colon polyps Sister 3       1/2 sister - same dad  . Irritable bowel syndrome Sister   . Hyperlipidemia Brother        1/2 bro, same dad  . Hypertension Brother        1/2 bro, same dad  . Heart disease Brother   . Colon cancer Neg Hx   . Liver cancer Neg Hx    Social History   Socioeconomic History  . Marital status: Married    Spouse name: Not on file  . Number of children: 1  . Years of education: Not on file  . Highest education level: Not on file  Occupational History  . Occupation: retired  Scientific laboratory technician  . Financial resource strain: Not hard at all  . Food insecurity    Worry: Never true    Inability: Never true  . Transportation needs    Medical: No    Non-medical: No  Tobacco Use  . Smoking status: Never Smoker  . Smokeless tobacco: Never Used  Substance and Sexual Activity  . Alcohol use: Yes    Alcohol/week: 0.0 standard drinks    Comment: occ  . Drug use: No  . Sexual activity: Not on file  Lifestyle  . Physical activity    Days per week: 0 days    Minutes per session: 0 min  . Stress: Not at all  Relationships  . Social Herbalist on phone: Not on file    Gets together: Not on file    Attends religious service: Not on file    Active member of club or  organization: Not on file    Attends meetings of clubs or organizations: Not on file    Relationship status: Not on file  Other Topics Concern  . Not on file  Social History Narrative   Married, lives at home with spouse.    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 Ascorbic Acid (VITAMIN C) 1000 MG tablet Take 1,000 mg by mouth daily.  Marland Kitchen aspirin-acetaminophen-caffeine (EXCEDRIN MIGRAINE) 250-250-65 MG per tablet Take 1 tablet by mouth every 6 (six) hours as needed. For migraine  . Calcium Carbonate-Vit D-Min 600-200 MG-UNIT TABS Take 1 tablet by mouth 2 (two) times daily.  . Cyanocobalamin (VITAMIN B 12 PO) Take 1 tablet by mouth daily.  . cyclobenzaprine (FLEXERIL) 10 MG tablet TAKE 1 TABLET BY MOUTH 3 TIMES DAILY  . diphenhydrAMINE (BENADRYL) 25 MG tablet Take 25 mg by mouth every 8 (eight) hours as needed. For allergies  . levalbuterol (XOPENEX HFA) 45 MCG/ACT inhaler Inhale 1-2 puffs into the lungs every 6 (six) hours as needed. For shortness of breath  . levalbuterol (XOPENEX) 1.25 MG/3ML nebulizer solution Take 1.25 mg by nebulization every 4 (four) hours as needed for wheezing.  . Melatonin 10 MG CAPS Take 10 mg by mouth at bedtime.  . mometasone-formoterol (DULERA) 200-5 MCG/ACT AERO Inhale 2 puffs into the lungs 2 (two) times daily. Rinse mouth  . pantoprazole (PROTONIX) 40 MG tablet TAKE 1 TABLET BY MOUTH EVERY DAY  . vitamin E 400 UNIT capsule Take 400 Units by mouth daily.    Marland Kitchen zolpidem (AMBIEN) 10 MG tablet TAKE 1 TABLET BY MOUTH AT BEDTIME  . [DISCONTINUED] pantoprazole (PROTONIX) 40 MG tablet Take 1 tablet (40 mg total) by mouth daily.   No facility-administered encounter medications on file as of 06/28/2019.     Activities of Daily Living In your present state of health, do you have any difficulty performing the following activities: 06/28/2019 08/25/2018   Hearing? N N  Vision? N N  Difficulty concentrating or making decisions? N N  Walking or climbing stairs? N Y  Dressing or bathing? N N  Doing errands, shopping? N N  Preparing Food and eating ? N -  Using the Toilet? N -  In the past six months, have you accidently leaked urine? N -  Do you have problems with loss of bowel control? N -  Managing your Medications? N -  Managing your Finances?  N -  Housekeeping or managing your Housekeeping? N -  Some recent data might be hidden    Patient Care Team: Biagio Borg, MD as PCP - General (Internal Medicine) Deneise Lever, MD as Consulting Physician (Pulmonary Disease) Servando Salina, MD (Obstetrics and Gynecology) Loletha Carrow Kirke Corin, MD as Consulting Physician (Gastroenterology)    Assessment:   This is a routine wellness examination for Maraiya. Physical assessment deferred to PCP.   Exercise Activities and Dietary recommendations Current Exercise Habits: The patient does not participate in regular exercise at present, Exercise limited by: None identified  Diet (meal preparation, eat out, water intake, caffeinated beverages, dairy products, fruits and vegetables): in general, a "healthy" diet  , well balanced   Reviewed heart healthy and diabetic diet. Encouraged patient to increase daily water and healthy fluid intake.  Goals    . Patient Stated    . Patient Stated     Maintain current health status.       Fall Risk Fall Risk  06/28/2019 02/10/2019 02/04/2018 06/10/2017 02/03/2017  Falls in the past year? 1 1 No No No  Number falls in past yr: 0 0 - - -  Injury with Fall? 0 0 - - -   Is the patient's home free of loose throw rugs in walkways, pet beds, electrical cords, etc?   yes      Grab bars in the bathroom? yes      Handrails on the stairs?   yes      Adequate lighting?   yes  Depression Screen PHQ 2/9 Scores 06/28/2019 02/10/2019 02/04/2018 02/03/2017  PHQ - 2 Score 0 0 0 0  PHQ- 9 Score - - - 0      Cognitive Function       Ad8 score reviewed for issues:  Issues making decisions: no  Less interest in hobbies / activities: no  Repeats questions, stories (family complaining): no  Trouble using ordinary gadgets (microwave, computer, phone):no  Forgets the month or year: no  Mismanaging finances: no  Remembering appts: no  Daily problems with thinking and/or memory: no Ad8 score is= 0  Immunization History  Administered Date(s) Administered  . Influenza Whole 09/14/2006  . Td 09/15/2007   Screening Tests Health Maintenance  Topic Date Due  . TETANUS/TDAP  02/10/2020 (Originally 09/14/2017)  . DEXA SCAN  Completed  . Hepatitis C Screening  Completed  . MAMMOGRAM  Discontinued      Plan:     Reviewed health maintenance screenings with patient today and relevant education, vaccines, and/or referrals were provided.   I have personally reviewed and noted the following in the patient's chart:   . Medical and social history . Use of alcohol, tobacco or illicit drugs  . Current medications and supplements . Functional ability and status . Nutritional status . Physical activity . Advanced directives . List of other physicians . Screenings to include cognitive, depression, and falls . Referrals and appointments  In addition, I have reviewed and discussed with patient certain preventive protocols, quality metrics, and best practice recommendations. A written personalized care plan for preventive services as well as general preventive health recommendations were provided to patient.     Michiel Cowboy, RN  06/28/2019    Medical screening examination/treatment/procedure(s) were performed by non-physician practitioner and as supervising physician I was immediately available for consultation/collaboration. I agree with above. Cathlean Cower, MD

## 2019-06-28 ENCOUNTER — Telehealth: Payer: Self-pay | Admitting: *Deleted

## 2019-06-28 ENCOUNTER — Ambulatory Visit (INDEPENDENT_AMBULATORY_CARE_PROVIDER_SITE_OTHER): Payer: Medicare Other | Admitting: *Deleted

## 2019-06-28 DIAGNOSIS — Z0000 Encounter for general adult medical examination without abnormal findings: Secondary | ICD-10-CM

## 2019-06-28 DIAGNOSIS — R5383 Other fatigue: Secondary | ICD-10-CM

## 2019-06-28 MED ORDER — CYCLOBENZAPRINE HCL 10 MG PO TABS: 10.0000 mg | ORAL_TABLET | Freq: Three times a day (TID) | ORAL | 1 refills | Status: DC

## 2019-06-28 NOTE — Telephone Encounter (Signed)
Done erx 

## 2019-06-28 NOTE — Telephone Encounter (Signed)
During AWV, patient request a refill for cyclobenzaprine.

## 2019-08-07 ENCOUNTER — Encounter: Payer: Self-pay | Admitting: Internal Medicine

## 2019-08-07 DIAGNOSIS — R21 Rash and other nonspecific skin eruption: Secondary | ICD-10-CM | POA: Diagnosis not present

## 2019-08-07 DIAGNOSIS — L986 Other infiltrative disorders of the skin and subcutaneous tissue: Secondary | ICD-10-CM | POA: Diagnosis not present

## 2019-08-07 DIAGNOSIS — C8400 Mycosis fungoides, unspecified site: Secondary | ICD-10-CM | POA: Diagnosis not present

## 2019-08-08 ENCOUNTER — Telehealth: Payer: Medicare Other | Admitting: Physician Assistant

## 2019-08-08 DIAGNOSIS — J069 Acute upper respiratory infection, unspecified: Secondary | ICD-10-CM

## 2019-08-08 MED ORDER — BENZONATATE 100 MG PO CAPS: 100.0000 mg | ORAL_CAPSULE | Freq: Two times a day (BID) | ORAL | 0 refills | Status: DC | PRN

## 2019-08-08 NOTE — Progress Notes (Signed)
E-Visit for Corona Virus Screening   Your current symptoms could be consistent with the coronavirus. Given the lack of fever and other concerning symptoms this could also be another viral respiratory infection, it can be difficult to distinguish between the two early on during the illness. I have included information on COVID 19 below.     Many health care providers can now test patients at their office but not all are.  [facility] has multiple testing sites. For information on our COVID testing locations and hours go to [email or web address]  Please quarantine yourself while awaiting your test results.  We are enrolling you in our [facility] for [facility] . Daily you will receive a questionnaire within the [facility] website. Our COVID 19 response team willl be monitoriing your responses daily. Please continue good preventive care measures, including:  frequent hand-washing, avoid touching your face, cover coughs/sneezes, stay out of crowds and keep a 6 foot distance from others.    COVID-19 is a respiratory illness with symptoms that are similar to the flu. Symptoms are typically mild to moderate, but there have been cases of severe illness and death due to the virus. The following symptoms may appear 2-14 days after exposure: . Fever . Cough . Shortness of breath or difficulty breathing . Chills . Repeated shaking with chills . Muscle pain . Headache . Sore throat . New loss of taste or smell . Fatigue . Congestion or runny nose . Nausea or vomiting . Diarrhea  If you develop fever/cough/breathlessness, please stay home for 10 days with improving symptoms and until you have had 24 hours of no fever (without taking a fever reducer).  Go to the nearest hospital ED for assessment if fever/cough/breathlessness are severe or illness seems like a threat to life.  It is vitally important that if you feel that you have an infection such as this virus or any other  virus that you stay home and away from places where you may spread it to others.  You should avoid contact with people age 84 and older.   You should wear a mask or cloth face covering over your nose and mouth if you must be around other people or animals, including pets (even at home). Try to stay at least 6 feet away from other people. This will protect the people around you.  You can use medication such as A prescription cough medication called Tessalon Perles 100 mg. You may take 1-2 capsules every 8 hours as needed for cough  You may also take acetaminophen (Tylenol) as needed for fever.   Reduce your risk of any infection by using the same precautions used for avoiding the common cold or flu:  Marland Kitchen Wash your hands often with soap and warm water for at least 20 seconds.  If soap and water are not readily available, use an alcohol-based hand sanitizer with at least 60% alcohol.  . If coughing or sneezing, cover your mouth and nose by coughing or sneezing into the elbow areas of your shirt or coat, into a tissue or into your sleeve (not your hands). . Avoid shaking hands with others and consider head nods or verbal greetings only. . Avoid touching your eyes, nose, or mouth with unwashed hands.  . Avoid close contact with people who are sick. . Avoid places or events with large numbers of people in one location, like concerts or sporting events. . Carefully consider travel plans you have or are making. . If you are planning any  travel outside or inside the [place], visit the CDC's Travelers' Health webpage for the latest health notices. . If you have some symptoms but not all symptoms, continue to monitor at home and seek medical attention if your symptoms worsen. . If you are having a medical emergency, call 911.  HOME CARE . Only take medications as instructed by your medical team. . Drink plenty of fluids and get plenty of rest. . A steam or ultrasonic humidifier can help if you have congestion.    GET HELP RIGHT AWAY IF YOU HAVE EMERGENCY WARNING SIGNS** FOR COVID-19. If you or someone is showing any of these signs seek emergency medical care immediately. Call 911 or proceed to your closest emergency facility if: . You develop worsening high fever. . Trouble breathing . Bluish lips or face . Persistent pain or pressure in the chest . New confusion . Inability to wake or stay awake . You cough up blood. . Your symptoms become more severe  **This list is not all possible symptoms. Contact your medical provider for any symptoms that are sever or concerning to you.   MAKE SURE YOU   Understand these instructions.  Will watch your condition.  Will get help right away if you are not doing well or get worse.  Your e-visit answers were reviewed by a board certified advanced clinical practitioner to complete your personal care plan.  Depending on the condition, your plan could have included both over the counter or prescription medications.  If there is a problem please reply once you have received a response from your provider.  Your safety is important to [place].  If you have drug allergies check your prescription carefully.    You can use MyChart to ask questions about today's visit, request a non-urgent call back, or ask for a work or school excuse for 24 hours related to this e-Visit. If it has been greater than 24 hours you will need to follow up with your provider, or enter a new e-Visit to address those concerns. You will get an e-mail in the next two days asking about your experience.  I hope that your e-visit has been valuable and will speed your recovery. Thank you for using e-visits.  Greater than 5 minutes, yet less than 10 minutes of time have been spent researching, coordinating, and implementing care for this patient today

## 2019-08-11 ENCOUNTER — Other Ambulatory Visit: Payer: Self-pay

## 2019-08-11 ENCOUNTER — Ambulatory Visit (INDEPENDENT_AMBULATORY_CARE_PROVIDER_SITE_OTHER): Payer: Medicare Other | Admitting: Family Medicine

## 2019-08-11 DIAGNOSIS — R059 Cough, unspecified: Secondary | ICD-10-CM

## 2019-08-11 DIAGNOSIS — R05 Cough: Secondary | ICD-10-CM | POA: Diagnosis not present

## 2019-08-11 NOTE — Progress Notes (Signed)
This visit type was conducted due to national recommendations for restrictions regarding the COVID-19 pandemic in an effort to limit this patient's exposure and mitigate transmission in our community.   Virtual Visit via Video Note  I connected with [name] [name] on 08/11/19 at  9:00 AM EST by a video enabled telemedicine application and verified that I am speaking with the correct person using two identifiers.  Location patient: home Location provider:work or home office Persons participating in the virtual visit: patient, provider  I discussed the limitations of evaluation and management by telemedicine and the availability of in person appointments. The patient expressed understanding and agreed to proceed.   HPI: [name] [name] states that her on Monday she developed a "head cold ".  No fever.  She was concerned because of her history of asthma and tendency to have things settle down in her chest.  She did an ED visit few days ago and was prescribed Tessalon which has helped her cough somewhat.  She is actually feeling some better at this point.  She did not get Covid tested as she states she did not have any other symptoms.  No diarrhea.  No body aches.  No dyspnea.  No loss of taste or smell.  No known sick contacts.  She continues taking her usual inhalers.  She thinks she had some wheezing earlier in the week but not now.  She feels that she is on the definite road to recovery.   ROS: See pertinent positives and negatives per HPI.  Past Medical History:  Diagnosis Date  . ASTHMA   . BREAST CANCER, HX OF 11/1979  . GERD   . GI bleed 11/92, 11/93, 11/94   GI Bleed  . Irritable bowel syndrome   . MIGRAINE HEADACHE   . Symptomatic anemia 08/24/2018    Past Surgical History:  Procedure Laterality Date  . ABDOMINAL HYSTERECTOMY  11/1979  . BREAST SURGERY    . ESOPHAGOGASTRODUODENOSCOPY N/A 08/26/2018   Procedure: ESOPHAGOGASTRODUODENOSCOPY (EGD);  Surgeon: Doran Stabler, MD;   Location: [facility];  Service: Gastroenterology;  Laterality: N/A;  . Left wrist  1963  . MASTECTOMY  11/1979   Bilaterally & implants  . OTHER SURGICAL HISTORY Bilateral    removal of breast implants  . TONSILLECTOMY  1955  . TUBAL LIGATION  1975    Family History  Problem Relation Age of Onset  . Coronary artery disease Father 75       CABG age 70  . Breast cancer Mother   . Uterine cancer Mother   . Coronary artery disease Mother   . Endometriosis Sister   . Colon polyps Sister 64       1/2 sister - same dad  . Irritable bowel syndrome Sister   . Hyperlipidemia Brother        1/2 bro, same dad  . Hypertension Brother        1/2 bro, same dad  . Heart disease Brother   . Colon cancer Neg Hx   . Liver cancer Neg Hx     SOCIAL HX: Non-smoker   Current Outpatient Medications:  .  Ascorbic Acid (VITAMIN C) 1000 MG tablet, Take 1,000 mg by mouth daily., Disp: , Rfl:  .  aspirin-acetaminophen-caffeine (EXCEDRIN MIGRAINE) 250-250-65 MG per tablet, Take 1 tablet by mouth every 6 (six) hours as needed. For migraine, Disp: , Rfl:  .  benzonatate (TESSALON) 100 MG capsule, Take 1 capsule (100 mg total) by mouth 2 (two) times daily  as needed for cough., Disp: 20 capsule, Rfl: 0 .  Calcium Carbonate-Vit D-Min 600-200 MG-UNIT TABS, Take 1 tablet by mouth 2 (two) times daily., Disp: , Rfl:  .  Cyanocobalamin (VITAMIN B 12 PO), Take 1 tablet by mouth daily., Disp: , Rfl:  .  cyclobenzaprine (FLEXERIL) 10 MG tablet, Take 1 tablet (10 mg total) by mouth 3 (three) times daily., Disp: 60 tablet, Rfl: 1 .  diphenhydrAMINE (BENADRYL) 25 MG tablet, Take 25 mg by mouth every 8 (eight) hours as needed. For allergies, Disp: , Rfl:  .  levalbuterol (XOPENEX HFA) 45 MCG/ACT inhaler, Inhale 1-2 puffs into the lungs every 6 (six) hours as needed. For shortness of breath, Disp: 1 Inhaler, Rfl: 2 .  levalbuterol (XOPENEX) 1.25 MG/3ML nebulizer solution, Take 1.25 mg by nebulization every 4 (four) hours  as needed for wheezing., Disp: 72 mL, Rfl: 12 .  Melatonin 10 MG CAPS, Take 10 mg by mouth at bedtime., Disp: , Rfl:  .  mometasone-formoterol (DULERA) 200-5 MCG/ACT AERO, Inhale 2 puffs into the lungs 2 (two) times daily. Rinse mouth, Disp: 1 Inhaler, Rfl: 2 .  pantoprazole (PROTONIX) 40 MG tablet, TAKE 1 TABLET BY MOUTH EVERY DAY, Disp: 90 tablet, Rfl: 1 .  vitamin E 400 UNIT capsule, Take 400 Units by mouth daily.  , Disp: , Rfl:  .  zolpidem (AMBIEN) 10 MG tablet, TAKE 1 TABLET BY MOUTH AT BEDTIME, Disp: 90 tablet, Rfl: 1  EXAM:  VITALS per patient if applicable:  GENERAL: alert, oriented, appears well and in no acute distress  HEENT: atraumatic, conjunttiva clear, no obvious abnormalities on inspection of external nose and ears  NECK: normal movements of the head and neck  LUNGS: on inspection no signs of respiratory distress, breathing rate appears normal, no obvious gross SOB, gasping or wheezing  CV: no obvious cyanosis  MS: moves all visible extremities without noticeable abnormality  PSYCH/NEURO: pleasant and cooperative, no obvious depression or anxiety, speech and thought processing grossly intact  ASSESSMENT AND PLAN:  Discussed the following assessment and plan:  Cough.  Probable viral syndrome.  Patient clinically improving.  -We discussed Covid testing but she declines at this time.  Suspicion is fairly low for COVID-19 based on her recent symptoms. -We discussed that most bronchitis is viral. -Continue plenty of fluids and rest and usual inhalers and follow-up for any fever, dyspnea, or other concerns     I discussed the assessment and treatment plan with the patient. The patient was provided an opportunity to ask questions and all were answered. The patient agreed with the plan and demonstrated an understanding of the instructions.   The patient was advised to call back or seek an in-person evaluation if the symptoms worsen or if the condition fails to improve  as anticipated.     Carolann Littler, MD

## 2019-08-16 ENCOUNTER — Ambulatory Visit: Payer: Medicare Other | Admitting: Internal Medicine

## 2019-08-30 ENCOUNTER — Telehealth (INDEPENDENT_AMBULATORY_CARE_PROVIDER_SITE_OTHER): Payer: Medicare Other | Admitting: Internal Medicine

## 2019-08-30 ENCOUNTER — Other Ambulatory Visit: Payer: Self-pay

## 2019-08-30 ENCOUNTER — Encounter: Payer: Self-pay | Admitting: Internal Medicine

## 2019-08-30 ENCOUNTER — Ambulatory Visit (INDEPENDENT_AMBULATORY_CARE_PROVIDER_SITE_OTHER): Payer: Medicare Other | Admitting: Internal Medicine

## 2019-08-30 DIAGNOSIS — T782XXS Anaphylactic shock, unspecified, sequela: Secondary | ICD-10-CM

## 2019-08-30 DIAGNOSIS — J45909 Unspecified asthma, uncomplicated: Secondary | ICD-10-CM

## 2019-08-30 MED ORDER — BENZONATATE 200 MG PO CAPS: 200.0000 mg | ORAL_CAPSULE | Freq: Three times a day (TID) | ORAL | 3 refills | Status: DC | PRN

## 2019-08-30 MED ORDER — LEVALBUTEROL TARTRATE 45 MCG/ACT IN AERO: 1.0000 | INHALATION_SPRAY | Freq: Four times a day (QID) | RESPIRATORY_TRACT | 2 refills | Status: DC | PRN

## 2019-08-30 MED ORDER — AZITHROMYCIN 250 MG PO TABS: ORAL_TABLET | ORAL | 3 refills | Status: DC

## 2019-08-30 MED ORDER — EPINEPHRINE 0.3 MG/0.3ML IJ SOAJ: INTRAMUSCULAR | 12 refills | Status: DC

## 2019-08-30 NOTE — Progress Notes (Signed)
Patient ID: [name] [name], female    DOB: Aug 18, 1951, 68 y.o.   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  She had ruptured breast implants removed this year and hopes that will mean less allergies/hypersensitivity like problems for her.  Has found that she can tolerate Levaquin. Very little asthma in the past year.  Using her Xopenex rescue inhaler less than once a week and rarely needing Dulera, no sleep disturbance.  Takes her nebulizer machine on trips but has not needed to use it much.  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 Pt states something else besides low hemoglobin level caused the asthma attack. Colonoscopy set for January 2020.  She apparently presented to ER for anemia with black tarry stools, received PRBC transfusion and had an allergic reaction requiring CODE BLUE and transferred to ICU.  Responded to IV steroids and IV benadryl.  EGD was negative.  There was no hemolysis and no antibody problems identified by blood bank, she had no angioedema or throat swelling.  Pulmonary consultant at the hospital felt this was best characterized as an allergic/asthma attack rather than a transfusion reaction.  She  suspects trace of a medication that she is sensitive to may have been in the unit of blood. Currently she feels somewhat weak and dyspneic with exertion which she associates with her anemia.  She denies cough, wheeze or chest discomfort. She asks refill of Xopenex nebulizer solution (tolerates Xopenex but not albuterol).  Has a Dulera inhaler that she feels safe using.  Asks for refill of EpiPen.  She is used EpiPen in the past.  Her chart lists allergy to epinephrine but again she thinks the issue is sulfite preservative and that the epinephrine is likely to overcome any allergic reaction from the sulfite.  She chooses to keep the EpiPen available and to use it at time of need based on her best judgment of risk benefit under the circumstances.  08/30/2019 Virtual Visit via Telephone Note   I connected with [name] [name] on 08/30/19 at  9:00 AM EST by telephone and verified that I am speaking with the correct person using two identifiers.  Location: Patient: H Provider: O   I discussed the limitations, risks, security and privacy concerns of performing an evaluation and management service by telephone and the availability of in person appointments. I also discussed with the patient that there may be a patient responsible charge related to this service. The patient expressed understanding and agreed to proceed.   History of Present Illness: (This visit was to be a video visit. We attempted to call to change to televisit, but couldn't reach patient, who then called back. 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 Head cold last month. Declines flu  vax "allergic". Uses  Dulera. Only occ need rescue inhaler.  Asks refills of Zpak and tessalon to hold, xopenex hfa and Epipen.   Observations/Objective:   Assessment and Plan: Asthma- mild persistent uncomplicated   Refills sent Multiple medication intolerance- She asks for specific meds she finds tolerated and effective.  Follow Up Instructions: 1 year   I discussed the assessment and treatment plan with the patient. The patient was provided an opportunity to ask questions and all were answered. The patient agreed with the plan and demonstrated an understanding of the instructions.   The patient was advised to call back or seek an in-person evaluation if the symptoms worsen or if the condition fails to improve as anticipated.  I provided 17 minutes of non-face-to-face time during this encounter.   Baird Lyons, MD    ROS-see HPI  + = positive Constitutional:   No-   weight loss, night sweats, fevers, chills, fatigue, lassitude. HEENT:   No-  headaches, difficulty swallowing, tooth/dental problems, sore throat,       No-  sneezing, itching, ear ache, nasal congestion, post nasal drip,  CV:  No-   chest pain, orthopnea, PND, swelling in lower extremities, anasarca,  dizziness, palpitations Resp: +  shortness of breath with exertion or at rest.              No-   productive cough,  non-productive cough,  No- coughing up of blood.              No-   change in color of mucus.  + wheezing.   Skin: No-   rash or lesions. GI:  No-   heartburn, indigestion, abdominal pain, nausea, vomiting, See HPI GU:  MS:  No-   joint pain or swelling. Neuro-     nothing unusual Psych:  No- change in mood or affect. No depression or anxiety.  No memory loss.   Objective:   Physical Exam General- Alert, Oriented, Affect-appropriate, Distress- none acute Skin- rash-none, lesions- none, excoriation- none Lymphadenopathy- none Head- atraumatic            Eyes- Gross vision intact, PERRLA, conjunctivae clear  secretions            Ears- Hearing, canals            Nose-  No-Septal dev, mucus- clear, no-polyps, erosion, perforation             Throat- Mallampati II , mucosa red , drainage- none, tonsils- atrophic , + active gag.  Neck- flexible , trachea midline, no stridor , thyroid nl, carotid no bruit Chest - symmetrical excursion , unlabored           Heart/CV- RRR , no murmur , no gallop  , no rub, nl s1 s2                           - JVD- none , edema- none, stasis changes- none, varices- none           Lung- clear to P&A, wheeze- none, cough- none , dullness-none, rub- none           Chest wall-  Abd-  Br/ Gen/ Rectal- Not done, not indicated Extrem- cyanosis- none, clubbing, none, atrophy- none, strength- nl Neuro- grossly intact to observation

## 2019-08-30 NOTE — Patient Instructions (Signed)
Glad to hear you are doing well  Med refills you requested for xopenex inhaler, epipen, tessalon and zpak were e-sent to pharmacy  Please call if we can help    Instructions    Return in about 1 year (around 08/29/2020). Glad to hear you are doing well  Med refills you requested for xopenex inhaler, epipen, tessalon and zpak were e-sent to pharmacy  Please call if we can help

## 2019-08-30 NOTE — Assessment & Plan Note (Signed)
Mild intermittent uncomplicated Plan- refill xopenex hfa, call for [facility] refill when needed

## 2019-08-30 NOTE — Patient Instructions (Signed)
Glad to hear you are doing well  Med refills you requested for xopenex inhaler, epipen, tessalon and zpak were e-sent to pharmacy  Please call if we can help

## 2019-09-26 DIAGNOSIS — L986 Other infiltrative disorders of the skin and subcutaneous tissue: Secondary | ICD-10-CM | POA: Diagnosis not present

## 2019-10-05 DIAGNOSIS — C8400 Mycosis fungoides, unspecified site: Secondary | ICD-10-CM | POA: Diagnosis not present

## 2019-10-19 DIAGNOSIS — C8409 Mycosis fungoides, extranodal and solid organ sites: Secondary | ICD-10-CM | POA: Diagnosis not present

## 2019-10-19 DIAGNOSIS — L400 Psoriasis vulgaris: Secondary | ICD-10-CM | POA: Diagnosis not present

## 2019-10-23 ENCOUNTER — Other Ambulatory Visit: Payer: Self-pay | Admitting: Internal Medicine

## 2019-10-23 NOTE — Telephone Encounter (Signed)
Done erx 

## 2019-10-25 DIAGNOSIS — L986 Other infiltrative disorders of the skin and subcutaneous tissue: Secondary | ICD-10-CM | POA: Diagnosis not present

## 2019-10-30 DIAGNOSIS — L986 Other infiltrative disorders of the skin and subcutaneous tissue: Secondary | ICD-10-CM | POA: Insufficient documentation

## 2019-12-19 ENCOUNTER — Encounter: Payer: Self-pay | Admitting: Internal Medicine

## 2019-12-20 ENCOUNTER — Encounter: Payer: Self-pay | Admitting: Internal Medicine

## 2019-12-25 NOTE — Telephone Encounter (Signed)
Dr Annamaria Boots, please advise on pt email, thanks  [name] [name], [name] [name] sent to [facility]  Phone Number: [phone]  Good Afternoon,   I was wondering if there has been any progress with the following request?   When I saw Dr. Annamaria Boots in December 2020, we discussed the Covid 19 vaccine and the fact It was not in my best interest to take it due to heath reasons. He indicated to me that he might be willing to write me a letter or a document stating that fact. Presently there appears to be a growing movement requiring proof of vaccination, as such would Dr. Annamaria Boots please provide me with the prior discussed documentation?   Thank you,  [name]

## 2019-12-26 ENCOUNTER — Other Ambulatory Visit: Payer: Self-pay | Admitting: Internal Medicine

## 2019-12-26 DIAGNOSIS — R5383 Other fatigue: Secondary | ICD-10-CM

## 2020-02-13 ENCOUNTER — Encounter: Payer: Medicare Other | Admitting: Internal Medicine

## 2020-02-15 ENCOUNTER — Encounter: Payer: Medicare Other | Admitting: Internal Medicine

## 2020-03-05 ENCOUNTER — Other Ambulatory Visit: Payer: Self-pay

## 2020-03-05 ENCOUNTER — Encounter: Payer: Self-pay | Admitting: Internal Medicine

## 2020-03-05 ENCOUNTER — Ambulatory Visit (INDEPENDENT_AMBULATORY_CARE_PROVIDER_SITE_OTHER): Payer: Medicare Other | Admitting: Internal Medicine

## 2020-03-05 VITALS — BP 132/80 | HR 74 | Temp 98.0°F | Ht 63.0 in | Wt 163.0 lb

## 2020-03-05 DIAGNOSIS — N1831 Chronic kidney disease, stage 3a: Secondary | ICD-10-CM | POA: Diagnosis not present

## 2020-03-05 DIAGNOSIS — Z0000 Encounter for general adult medical examination without abnormal findings: Secondary | ICD-10-CM

## 2020-03-05 DIAGNOSIS — N183 Chronic kidney disease, stage 3 unspecified: Secondary | ICD-10-CM

## 2020-03-05 DIAGNOSIS — E039 Hypothyroidism, unspecified: Secondary | ICD-10-CM | POA: Diagnosis not present

## 2020-03-05 DIAGNOSIS — D509 Iron deficiency anemia, unspecified: Secondary | ICD-10-CM | POA: Diagnosis not present

## 2020-03-05 DIAGNOSIS — R739 Hyperglycemia, unspecified: Secondary | ICD-10-CM | POA: Diagnosis not present

## 2020-03-05 DIAGNOSIS — R5383 Other fatigue: Secondary | ICD-10-CM

## 2020-03-05 HISTORY — DX: Chronic kidney disease, stage 3 unspecified: N18.30

## 2020-03-05 MED ORDER — ZOLPIDEM TARTRATE 10 MG PO TABS: 10.0000 mg | ORAL_TABLET | Freq: Every day | ORAL | 1 refills | Status: DC

## 2020-03-05 MED ORDER — PANTOPRAZOLE SODIUM 40 MG PO TBEC: 40.0000 mg | DELAYED_RELEASE_TABLET | Freq: Every day | ORAL | 3 refills | Status: DC

## 2020-03-05 MED ORDER — CYCLOBENZAPRINE HCL 10 MG PO TABS: 10.0000 mg | ORAL_TABLET | Freq: Three times a day (TID) | ORAL | 2 refills | Status: DC

## 2020-03-05 NOTE — Assessment & Plan Note (Signed)
For f/u labs 

## 2020-03-05 NOTE — Assessment & Plan Note (Signed)
stable overall by history and exam, recent data reviewed with pt, and pt to continue medical treatment as before,  to f/u any worsening symptoms or concerns  

## 2020-03-05 NOTE — Patient Instructions (Signed)
Please continue all other medications as before, and refills have been done if requested.  Please have the pharmacy call with any other refills you may need.  Please continue your efforts at being more active, low cholesterol diet, and weight control.  You are otherwise up to date with prevention measures today.  Please keep your appointments with your specialists as you may have planned  Please go to the LAB at the blood drawing area for the tests to be done at the [facility] will be contacted by phone if any changes need to be made immediately.  Otherwise, you will receive a letter about your results with an explanation, but please check with MyChart first.  Please remember to sign up for MyChart if you have not done so, as this will be important to you in the future with finding out test results, communicating by private email, and scheduling acute appointments online when needed.  Please make an Appointment to return for your 1 year visit, or sooner if needed

## 2020-03-05 NOTE — Progress Notes (Addendum)
Subjective:    Patient ID: [name] [name], female    DOB: 16-Jan-1951, 69 y.o.   MRN: [id number]  HPI  Here for wellness and f/u;  Overall doing ok;  Pt denies Chest pain, worsening SOB, DOE, wheezing, orthopnea, PND, worsening LE edema, palpitations, dizziness or syncope.  Pt denies neurological change such as new headache, facial or extremity weakness.  Pt denies polydipsia, polyuria, or low sugar symptoms. Pt states overall good compliance with treatment and medications, good tolerability, and has been trying to follow appropriate diet.  Pt denies worsening depressive symptoms, suicidal ideation or panic. No fever, night sweats, wt loss, loss of appetite, or other constitutional symptoms.  Pt states good ability with ADL's, has low fall risk, home safety reviewed and adequate, no other significant changes in hearing or vision, and only occasionally active with exercise. No new complaints.  No overt bleeding, Denies hyper or hypo thyroid symptoms such as voice, skin or hair change. Past Medical History:  Diagnosis Date  . ASTHMA   . BREAST CANCER, HX OF 11/1979  . GERD   . GI bleed 11/92, 11/93, 11/94   GI Bleed  . Irritable bowel syndrome   . MIGRAINE HEADACHE   . Symptomatic anemia 08/24/2018   Past Surgical History:  Procedure Laterality Date  . ABDOMINAL HYSTERECTOMY  11/1979  . BREAST SURGERY    . ESOPHAGOGASTRODUODENOSCOPY N/A 08/26/2018   Procedure: ESOPHAGOGASTRODUODENOSCOPY (EGD);  Surgeon: Doran Stabler, MD;  Location: [place];  Service: Gastroenterology;  Laterality: N/A;  . Left wrist  1963  . MASTECTOMY  11/1979   Bilaterally & implants  . OTHER SURGICAL HISTORY Bilateral    removal of breast implants  . TONSILLECTOMY  1955  . TUBAL LIGATION  1975    reports that she has never smoked. She has never used smokeless tobacco. She reports current alcohol use. She reports that she does not use drugs. family history includes Breast cancer in her mother; Colon polyps  (age of onset: 56) in her sister; Coronary artery disease in her mother; Coronary artery disease (age of onset: 90) in her father; Endometriosis in her sister; Heart disease in her brother; Hyperlipidemia in her brother; Hypertension in her brother; Irritable bowel syndrome in her sister; Uterine cancer in her mother. Allergies  Allergen Reactions  . Albuterol Sulfate Anaphylaxis    Pt states she has tolerated levalbuterol  . Amoxicillin-Pot Clavulanate     REACTION: ANAPHYLACTIC REACTION  Lips swell   . Epipen [Epinephrine] Other (See Comments)    Allergic to preservatives in Epipen with caution!!!  . Fluzone     REACTION: ANAPHYLACTIC  . Influenza Vaccines Anaphylaxis  . Morphine     REACTION: ANAPHYLACTIC REACTION  . Povidone-Iodine     REACTION: HIVES/ANAPHYLACTIC WITH INJECTABLE DYE  . Sulfonamide Derivatives     REACTION: ANAPHYLACTIC REACTION  . Atrovent [Ipratropium] Other (See Comments)    Has sulfa in preservative  . Ciprofloxacin     Itchy palms  . Hyoscyamine Sulfate Hives  . Meperidine Hcl     REACTION: N\T\V  . Other     Mucomist-unable to breathe  . Pepcid [Famotidine] Other (See Comments)  . Pneumococcal Vaccines     Pt declines  . Ppd [Tuberculin Purified Protein Derivative]     Arm swelling with red streaks  . Reglan [Metoclopramide] Other (See Comments)  . Zostavax [Zoster Vaccine Live]     Pt declines   Current Outpatient Medications on File Prior to Visit  Medication Sig Dispense Refill  . Ascorbic Acid (VITAMIN C) 1000 MG tablet Take 1,000 mg by mouth daily.    Marland Kitchen aspirin-acetaminophen-caffeine (EXCEDRIN MIGRAINE) 250-250-65 MG per tablet Take 1 tablet by mouth every 6 (six) hours as needed. For migraine    . benzonatate (TESSALON) 200 MG capsule Take 1 capsule (200 mg total) by mouth 3 (three) times daily as needed for cough. 30 capsule 3  . Calcium Carbonate-Vit D-Min 600-200 MG-UNIT TABS Take 1 tablet by mouth 2 (two) times daily.    .  Cyanocobalamin (VITAMIN B 12 PO) Take 1 tablet by mouth daily.    . diphenhydrAMINE (BENADRYL) 25 MG tablet Take 25 mg by mouth every 8 (eight) hours as needed. For allergies    . EPINEPHrine 0.3 mg/0.3 mL IJ SOAJ injection As directed for severe allergic reaction 2 each 12  . levalbuterol (XOPENEX HFA) 45 MCG/ACT inhaler Inhale 1-2 puffs into the lungs every 6 (six) hours as needed. For shortness of breath 1 Inhaler 2  . levalbuterol (XOPENEX) 1.25 MG/3ML nebulizer solution Take 1.25 mg by nebulization every 4 (four) hours as needed for wheezing. 72 mL 12  . Melatonin 10 MG CAPS Take 10 mg by mouth at bedtime as needed.     . mometasone-formoterol (DULERA) 200-5 MCG/ACT AERO Inhale 2 puffs into the lungs 2 (two) times daily. Rinse mouth 1 Inhaler 2  . vitamin E 400 UNIT capsule Take 400 Units by mouth daily.       No current facility-administered medications on file prior to visit.   Review of Systems All otherwise neg per pt    Objective:   Physical Exam BP 132/80 (BP Location: Left Arm, Patient Position: Sitting, Cuff Size: Large)   Pulse 74   Temp 98 F (36.7 C) (Oral)   Ht 5\' 3"  (1.6 m)   Wt 163 lb (73.9 kg)   SpO2 98%   BMI 28.87 kg/m  VS noted,  Constitutional: Pt appears in NAD HENT: Head: NCAT.  Right Ear: External ear normal.  Left Ear: External ear normal.  Eyes: . Pupils are equal, round, and reactive to light. Conjunctivae and EOM are normal Nose: without d/c or deformity Neck: Neck supple. Gross normal ROM Cardiovascular: Normal rate and regular rhythm.   Pulmonary/Chest: Effort normal and breath sounds without rales or wheezing.  Abd:  Soft, NT, ND, + BS, no organomegaly Neurological: Pt is alert. At baseline orientation, motor grossly intact Skin: Skin is warm. No rashes, other new lesions, no LE edema Psychiatric: Pt behavior is normal without agitation  All otherwise neg per pt Lab Results  Component Value Date   WBC 7.3 02/10/2019   HGB 14.4 02/10/2019    HCT 42.5 02/10/2019   PLT 548.0 (H) 02/10/2019   GLUCOSE 95 02/10/2019   CHOL 238 (H) 02/10/2019   TRIG 247.0 (H) 02/10/2019   HDL 67.80 02/10/2019   LDLDIRECT 75.0 02/10/2019   LDLCALC 133 (H) 02/03/2017   ALT 16 02/10/2019   AST 19 02/10/2019   NA 141 02/10/2019   K 4.0 02/10/2019   CL 104 02/10/2019   CREATININE 1.23 (H) 02/10/2019   BUN 15 02/10/2019   CO2 26 02/10/2019   TSH 6.18 (H) 02/10/2019   INR 1.0 08/24/2018   HGBA1C 6.3 02/10/2019      Assessment & Plan:

## 2020-03-05 NOTE — Assessment & Plan Note (Signed)

## 2020-03-13 ENCOUNTER — Other Ambulatory Visit (INDEPENDENT_AMBULATORY_CARE_PROVIDER_SITE_OTHER): Payer: Medicare Other

## 2020-03-13 DIAGNOSIS — Z0000 Encounter for general adult medical examination without abnormal findings: Secondary | ICD-10-CM | POA: Diagnosis not present

## 2020-03-13 DIAGNOSIS — E039 Hypothyroidism, unspecified: Secondary | ICD-10-CM | POA: Diagnosis not present

## 2020-03-13 DIAGNOSIS — D509 Iron deficiency anemia, unspecified: Secondary | ICD-10-CM | POA: Diagnosis not present

## 2020-03-13 DIAGNOSIS — R739 Hyperglycemia, unspecified: Secondary | ICD-10-CM

## 2020-03-13 LAB — LIPID PANEL
Cholesterol: 242 mg/dL — ABNORMAL HIGH (ref 0–200)
HDL: 59.4 mg/dL (ref >39.00)
NonHDL: 182.92
Total CHOL/HDL Ratio: 4
Triglycerides: 336 mg/dL — ABNORMAL HIGH (ref 0.0–149.0)
VLDL: 67.2 mg/dL — ABNORMAL HIGH (ref 0.0–40.0)

## 2020-03-13 LAB — CBC WITH DIFFERENTIAL/PLATELET
Basophils Absolute: 0.1 10*3/uL (ref 0.0–0.1)
Basophils Relative: 1.4 % (ref 0.0–3.0)
Eosinophils Absolute: 0.3 10*3/uL (ref 0.0–0.7)
Eosinophils Relative: 3.6 % (ref 0.0–5.0)
HCT: 41.2 % (ref 36.0–46.0)
Hemoglobin: 13.8 g/dL (ref 12.0–15.0)
Lymphocytes Relative: 27.7 % (ref 12.0–46.0)
Lymphs Abs: 2.6 10*3/uL (ref 0.7–4.0)
MCHC: 33.4 g/dL (ref 30.0–36.0)
MCV: 85.6 fl (ref 78.0–100.0)
Monocytes Absolute: 0.7 10*3/uL (ref 0.1–1.0)
Monocytes Relative: 7.8 % (ref 3.0–12.0)
Neutro Abs: 5.5 10*3/uL (ref 1.4–7.7)
Neutrophils Relative %: 59.5 % (ref 43.0–77.0)
Platelets: 530 10*3/uL — ABNORMAL HIGH (ref 150.0–400.0)
RBC: 4.81 Mil/uL (ref 3.87–5.11)
RDW: 14.8 % (ref 11.5–15.5)
WBC: 9.3 10*3/uL (ref 4.0–10.5)

## 2020-03-13 LAB — IBC PANEL
Iron: 94 ug/dL (ref 42–145)
Saturation Ratios: 19.1 % — ABNORMAL LOW (ref 20.0–50.0)
Transferrin: 351 mg/dL (ref 212.0–360.0)

## 2020-03-13 LAB — HEPATIC FUNCTION PANEL
ALT: 23 U/L (ref 0–35)
AST: 22 U/L (ref 0–37)
Albumin: 4.5 g/dL (ref 3.5–5.2)
Alkaline Phosphatase: 57 U/L (ref 39–117)
Bilirubin, Direct: 0.1 mg/dL (ref 0.0–0.3)
Total Bilirubin: 0.4 mg/dL (ref 0.2–1.2)
Total Protein: 7.1 g/dL (ref 6.0–8.3)

## 2020-03-13 LAB — BASIC METABOLIC PANEL
BUN: 15 mg/dL (ref 6–23)
CO2: 26 mEq/L (ref 19–32)
Calcium: 9.9 mg/dL (ref 8.4–10.5)
Chloride: 103 mEq/L (ref 96–112)
Creatinine, Ser: 1.17 mg/dL (ref 0.40–1.20)
GFR: 45.56 mL/min — ABNORMAL LOW (ref >60.00)
Glucose, Bld: 106 mg/dL — ABNORMAL HIGH (ref 70–99)
Potassium: 4 mEq/L (ref 3.5–5.1)
Sodium: 139 mEq/L (ref 135–145)

## 2020-03-13 LAB — FERRITIN: Ferritin: 33.2 ng/mL (ref 10.0–291.0)

## 2020-03-13 LAB — T4, FREE: Free T4: 1.71 ng/dL — ABNORMAL HIGH (ref 0.60–1.60)

## 2020-03-13 LAB — LDL CHOLESTEROL, DIRECT: Direct LDL: 75 mg/dL

## 2020-03-13 LAB — HEMOGLOBIN A1C: Hgb A1c MFr Bld: 6.3 % (ref 4.6–6.5)

## 2020-03-13 LAB — TSH: TSH: 5.13 u[IU]/mL — ABNORMAL HIGH (ref 0.35–4.50)

## 2020-05-21 ENCOUNTER — Telehealth: Payer: Self-pay | Admitting: Internal Medicine

## 2020-05-21 NOTE — Telephone Encounter (Signed)
ATC patient X1 LMTCB

## 2020-05-22 NOTE — Telephone Encounter (Signed)
Attempted to call pt but line went straight to VM. Left message for her to return call. 

## 2020-05-22 NOTE — Telephone Encounter (Signed)
Pt is returning call - [phone]

## 2020-05-23 NOTE — Telephone Encounter (Signed)
lmtcb for pt.  

## 2020-05-23 NOTE — Telephone Encounter (Signed)
Spoke with patient she states that she got the flu shot in 2008 and had severe anaphylaxis to it that resulted in her going to the ED. It is also listed on her allergy list. Patient feels like she is going to forced into getting the Covid vaccine just so that she is able to leave her house and live life, she is now retired.She states she is very nervous about it and feels like the literacy about it is very contradictory. She would like to speak with you if possible about this and if not able then get your recommendations on what you think she should do.  Patient can be reached at [phone]  Dr. Annamaria Boots please advise

## 2020-05-30 NOTE — Telephone Encounter (Signed)
Attempted to call pt but line went straight to VM. Left message for pt to return call. Also in the message I left, I stated to her to notify our front staff that you are returning a call from Dr. Annamaria Boots himself.

## 2020-05-30 NOTE — Telephone Encounter (Signed)
I have tried to return her call and keep getting message that the number listed is "not available"

## 2020-05-30 NOTE — Telephone Encounter (Signed)
Spoke with the pt  She states that she can be reached only at [phone] (H) She will be unavailable after 3:15 pm today but you can call her anytime after 8 am tomorrow am  Thanks

## 2020-05-31 NOTE — Telephone Encounter (Signed)
I left message of attempted return of her call on VM.

## 2020-05-31 NOTE — Telephone Encounter (Signed)
We were finally able to get in touch today. She has appropriate concerns and questions about Covid vaccine safety for her. I made appropriate disclaimers about uncertainties, uniqueness of individual response etc. I did tell her I felt it was safer to get vaccinated with ability to use Epipen/ Steroids, etc, than to take chances with uninhibited viral infection. She will look into it Monday AM.

## 2020-06-03 DIAGNOSIS — H40013 Open angle with borderline findings, low risk, bilateral: Secondary | ICD-10-CM | POA: Diagnosis not present

## 2020-06-03 DIAGNOSIS — H02834 Dermatochalasis of left upper eyelid: Secondary | ICD-10-CM | POA: Diagnosis not present

## 2020-06-03 DIAGNOSIS — H2513 Age-related nuclear cataract, bilateral: Secondary | ICD-10-CM | POA: Diagnosis not present

## 2020-06-03 DIAGNOSIS — H02831 Dermatochalasis of right upper eyelid: Secondary | ICD-10-CM | POA: Diagnosis not present

## 2020-06-04 ENCOUNTER — Encounter: Payer: Self-pay | Admitting: Internal Medicine

## 2020-06-04 ENCOUNTER — Encounter (HOSPITAL_COMMUNITY): Payer: Self-pay | Admitting: Emergency Medicine

## 2020-06-04 ENCOUNTER — Emergency Department (HOSPITAL_COMMUNITY)
Admission: EM | Admit: 2020-06-04 | Discharge: 2020-06-04 | Disposition: A | Discharge status: Home / Self Care | Payer: Medicare Other | Attending: Emergency Medicine | Admitting: Emergency Medicine

## 2020-06-04 ENCOUNTER — Ambulatory Visit: Payer: Medicare Other | Attending: Critical Care Medicine

## 2020-06-04 ENCOUNTER — Other Ambulatory Visit: Payer: Self-pay

## 2020-06-04 DIAGNOSIS — R061 Stridor: Secondary | ICD-10-CM | POA: Diagnosis not present

## 2020-06-04 DIAGNOSIS — T7840XA Allergy, unspecified, initial encounter: Secondary | ICD-10-CM | POA: Diagnosis not present

## 2020-06-04 DIAGNOSIS — R0789 Other chest pain: Secondary | ICD-10-CM | POA: Diagnosis not present

## 2020-06-04 DIAGNOSIS — Z7982 Long term (current) use of aspirin: Secondary | ICD-10-CM | POA: Insufficient documentation

## 2020-06-04 DIAGNOSIS — I499 Cardiac arrhythmia, unspecified: Secondary | ICD-10-CM | POA: Diagnosis not present

## 2020-06-04 DIAGNOSIS — Z853 Personal history of malignant neoplasm of breast: Secondary | ICD-10-CM | POA: Insufficient documentation

## 2020-06-04 DIAGNOSIS — Z743 Need for continuous supervision: Secondary | ICD-10-CM | POA: Diagnosis not present

## 2020-06-04 DIAGNOSIS — R000 Tachycardia, unspecified: Secondary | ICD-10-CM | POA: Diagnosis not present

## 2020-06-04 DIAGNOSIS — R0602 Shortness of breath: Secondary | ICD-10-CM | POA: Diagnosis present

## 2020-06-04 DIAGNOSIS — J45909 Unspecified asthma, uncomplicated: Secondary | ICD-10-CM | POA: Diagnosis not present

## 2020-06-04 DIAGNOSIS — E039 Hypothyroidism, unspecified: Secondary | ICD-10-CM | POA: Diagnosis not present

## 2020-06-04 DIAGNOSIS — R0689 Other abnormalities of breathing: Secondary | ICD-10-CM | POA: Diagnosis not present

## 2020-06-04 DIAGNOSIS — Z23 Encounter for immunization: Secondary | ICD-10-CM

## 2020-06-04 DIAGNOSIS — R6889 Other general symptoms and signs: Secondary | ICD-10-CM | POA: Diagnosis not present

## 2020-06-04 MED ORDER — DIPHENHYDRAMINE HCL 25 MG PO TABS: 25.0000 mg | ORAL_TABLET | Freq: Four times a day (QID) | ORAL | 0 refills | Status: AC | PRN

## 2020-06-04 MED ORDER — DIPHENHYDRAMINE HCL 25 MG PO TABS: 25.0000 mg | ORAL_TABLET | Freq: Four times a day (QID) | ORAL | 0 refills | Status: DC | PRN

## 2020-06-04 MED ORDER — PREDNISONE 10 MG PO TABS: 40.0000 mg | ORAL_TABLET | Freq: Every day | ORAL | 0 refills | Status: DC

## 2020-06-04 MED ORDER — SODIUM CHLORIDE 0.9 % IV SOLN
INTRAVENOUS | Status: DC
Start: 2020-06-04 — End: 2020-06-04

## 2020-06-04 MED ORDER — PREDNISONE 10 MG PO TABS: ORAL_TABLET | ORAL | 0 refills | Status: AC

## 2020-06-04 MED ORDER — LEVALBUTEROL HCL 1.25 MG/0.5ML IN NEBU
1.2500 mg | INHALATION_SOLUTION | Freq: Once | RESPIRATORY_TRACT | Status: AC
  Administered 2020-06-04: 1.25 mg via RESPIRATORY_TRACT
  Filled 2020-06-04: qty 0.5

## 2020-06-04 NOTE — Discharge Instructions (Addendum)
At this time there does not appear to be the presence of an emergent medical condition, however there is always the potential for conditions to change. Please read and follow the below instructions.  Please return to the Emergency Department immediately for any new or worsening symptoms. Please be sure to follow up with your Primary Care Provider within one week regarding your visit today; please call their office to schedule an appointment even if you are feeling better for a follow-up visit. Please take the medication prednisone as prescribed for the next 5 days.  You may take the medication Benadryl as prescribed as well to help with symptoms of allergic reaction.  Please avoid further allergen exposures.  Please continue to carry your EpiPen with you and use as needed for recurrence of reaction.  If you use your EpiPen you must call 911 immediately and return to the nearest ER for evaluation.  Get help right away if: You develop symptoms of an allergic reaction. You may notice them soon after you are exposed to a substance. Symptoms may include: Flushed skin. Hives. Swelling of the eyes, lips, face, mouth, tongue, or throat. Difficulty breathing, speaking, or swallowing. Wheezing. Dizziness or light-headedness. Fainting. Pain or cramping in the abdomen. Vomiting. Diarrhea. You used epinephrine. You need more medical care even if the medicine seems to be working. This is important because anaphylaxis may happen again within 72 hours (rebound anaphylaxis). You may need more doses of epinephrine. You have any new/concerning or worsening of symptoms These symptoms may represent a serious problem that is an emergency. Do not wait to see if the symptoms will go away. Do the following right away: Use the auto-injector pen as you have been instructed. Get medical help. Call your local emergency services (911 in the [place] Do not drive yourself to the hospital.  Please read the additional  information packets attached to your discharge summary.  Do not take your medicine if  develop an itchy rash, swelling in your mouth or lips, or difficulty breathing; call 911 and seek immediate emergency medical attention if this occurs.  You may review your lab tests and imaging results in their entirety on your MyChart account.  Please discuss all results of fully with your primary care provider and other specialist at your follow-up visit.  Note: Portions of this text may have been transcribed using voice recognition software. Every effort was made to ensure accuracy; however, inadvertent computerized transcription errors may still be present.

## 2020-06-04 NOTE — ED Triage Notes (Signed)
Patient arrives to ED via [facility] after receiving the [place] vaccination. Per EMS 15-30 minutes after vaccine was administer pt suddenly began to become SOB and had tightness in her throat and chest. Pt received .3mg  IM Epi, 50mg  benadryl, and 125mg  solu medrol.

## 2020-06-04 NOTE — Progress Notes (Signed)
   Covid-19 Vaccination Clinic  Name:  [name] [name]    MRN: [id number] DOB: 03-09-1951  06/04/2020  [name] was observed post Covid-19 immunization for 30 minutes based on pre-vaccination screening .  During the observation period, she experienced an adverse reaction with the following symptoms:  difficulty breathing, rapid heart rate and coughing, c/o difficulty swallowing.  Assessment : Time of assessment 10:12. Alert and oriented, Anxious, Dyspnea and Tachycardia.  Actions taken:  Vitals sign taken  EMS called at  10:15. VAERS form obtained and completed by S. Clayborne Artist, MSN RN. Hand off to EMS upon arrival at 10:23.   Vital signs:  10:13  B/P 145/105, HR 132, O2 Sat 98%  10:20  B/P  156/121, HR 137, O2 Sat 98%  Medications administered: Diphenhydramine (Benadryl) 12.5 ml (25 mg) by mouth. Time: 10:20 . Administered by S. Clayborne Artist, MSN RN. Epinephrine 0.3 mg IM , Location : Left Thigh  ,  Time: 10:20. Administered by S. Clayborne Artist, MSN RN.  Disposition:Patient evaluated and transferred to the hospital for further evaluation and care. Time of transport: 10:25.   Immunizations Administered    Name Date Dose VIS Date Route   Pfizer COVID-19 Vaccine 06/04/2020  9:58 AM 0.3 mL 11/08/2018 Intramuscular   Manufacturer: [place]   Lot: [id number]   [place]: [id number]

## 2020-06-04 NOTE — ED Provider Notes (Addendum)
[facility] EMERGENCY DEPARTMENT Provider Note   CSN: [id number] Arrival date & time: 06/04/20  1100     History Chief Complaint  Patient presents with  . Allergic Reaction    [name] [name] is a 69 y.o. female   Arrives today via EMS following allergic reaction.  Patient received first dose Pfizer Covid vaccine today around 9:45 AM, shortly afterwards developed shortness of breath and tightness to her chest and throat.  She reports this feels exactly similar to previous allergic reactions.  She was given 0.3 mg epinephrine, 50 mg Benadryl and 125 mg Solu-Medrol prior to arrival.  She reports some improvement in her symptoms.  Denies headache, cough, abdominal pain, nausea/vomiting, diarrhea, rash or any additional concerns. HPI     Past Medical History:  Diagnosis Date  . ASTHMA   . BREAST CANCER, HX OF 11/1979  . CKD (chronic kidney disease) stage 3, GFR 30-59 ml/min 03/05/2020  . GERD   . GI bleed 11/92, 11/93, 11/94   GI Bleed  . Irritable bowel syndrome   . MIGRAINE HEADACHE   . Symptomatic anemia 08/24/2018    Patient Active Problem List   Diagnosis Date Noted  . Hypothyroidism 03/05/2020  . CKD (chronic kidney disease) stage 3, GFR 30-59 ml/min 03/05/2020  . Hyperglycemia 02/10/2019  . Wheezing   . Symptomatic anemia 08/24/2018  . Syncope 08/24/2018  . GI bleed 08/24/2018  . Iron deficiency anemia 02/04/2018  . Preventative health care 02/03/2017  . Osteopenia 02/03/2017  . Hypertriglyceridemia 08/22/2015  . H/O TB skin testing 12/03/2012  . Anaphylactic reaction   . Thrombocytosis ([place]) 01/19/2011  . MIGRAINE HEADACHE 08/10/2008  . Non-allergic asthma 08/10/2008  . GERD 08/10/2008  . Irritable bowel syndrome 08/10/2008  . Headache(784.0) 08/10/2008  . BREAST CANCER, HX OF 08/10/2008    Past Surgical History:  Procedure Laterality Date  . ABDOMINAL HYSTERECTOMY  11/1979  . BREAST SURGERY    . ESOPHAGOGASTRODUODENOSCOPY N/A  08/26/2018   Procedure: ESOPHAGOGASTRODUODENOSCOPY (EGD);  Surgeon: Doran Stabler, MD;  Location: [facility];  Service: Gastroenterology;  Laterality: N/A;  . Left wrist  1963  . MASTECTOMY  11/1979   Bilaterally & implants  . OTHER SURGICAL HISTORY Bilateral    removal of breast implants  . TONSILLECTOMY  1955  . TUBAL LIGATION  1975     OB History   No obstetric history on file.     Family History  Problem Relation Age of Onset  . Coronary artery disease Father 70       CABG age 76  . Breast cancer Mother   . Uterine cancer Mother   . Coronary artery disease Mother   . Endometriosis Sister   . Colon polyps Sister 67       1/2 sister - same dad  . Irritable bowel syndrome Sister   . Hyperlipidemia Brother        1/2 bro, same dad  . Hypertension Brother        1/2 bro, same dad  . Heart disease Brother   . Colon cancer Neg Hx   . Liver cancer Neg Hx     Social History   Tobacco Use  . Smoking status: Never Smoker  . Smokeless tobacco: Never Used  Vaping Use  . Vaping Use: Never used  Substance Use Topics  . Alcohol use: Yes    Alcohol/week: 0.0 standard drinks    Comment: occ  . Drug use: No    Home  Medications Prior to Admission medications   Medication Sig Start Date End Date Taking? Authorizing Provider  Ascorbic Acid (VITAMIN C) 1000 MG tablet Take 1,000 mg by mouth daily.   Yes [provider]  aspirin-acetaminophen-caffeine (EXCEDRIN MIGRAINE) [phone] MG per tablet Take 1 tablet by mouth every 6 (six) hours as needed. For migraine   Yes [provider]  Cyanocobalamin (VITAMIN B 12 PO) Take 1 tablet by mouth daily.   Yes [provider]  cyclobenzaprine (FLEXERIL) 10 MG tablet Take 1 tablet (10 mg total) by mouth 3 (three) times daily. Annual appt due in May must see provider for future refills Patient taking differently: Take 5-10 mg by mouth 3 (three) times daily as needed for muscle spasms.  03/05/20  Yes Biagio Borg, MD  EPINEPHrine 0.3 mg/0.3 mL IJ SOAJ injection As directed for severe allergic reaction Patient taking differently: Inject 0.3 mg into the muscle once as needed for anaphylaxis.  08/30/19  Yes Young, Tarri Fuller D, MD  levalbuterol [facility] HFA) 45 MCG/ACT inhaler Inhale 1-2 puffs into the lungs every 6 (six) hours as needed. For shortness of breath 08/30/19  Yes Young, Tarri Fuller D, MD  levalbuterol Penne Lash) 1.25 MG/3ML nebulizer solution Take 1.25 mg by nebulization every 4 (four) hours as needed for wheezing. 08/29/18  Yes Young, Tarri Fuller D, MD  Melatonin 10 MG CAPS Take 10 mg by mouth at bedtime as needed (sleep).    Yes [provider]  mometasone-formoterol (DULERA) 200-5 MCG/ACT AERO Inhale 2 puffs into the lungs 2 (two) times daily. Rinse mouth 08/23/13  Yes Rowe Clack, MD  pantoprazole (PROTONIX) 40 MG tablet Take 1 tablet (40 mg total) by mouth daily. Annual appt due in May must see provider for future refills 03/05/20  Yes Biagio Borg, MD  vitamin E 400 UNIT capsule Take 400 Units by mouth daily.     Yes [provider]  zolpidem (AMBIEN) 10 MG tablet Take 1 tablet (10 mg total) by mouth at bedtime. Patient taking differently: Take 10 mg by mouth at bedtime as needed for sleep.  03/05/20  Yes Biagio Borg, MD  diphenhydrAMINE (BENADRYL) 25 MG tablet Take 1 tablet (25 mg total) by mouth every 6 (six) hours as needed for up to 5 days. 06/04/20 06/09/20  Nuala Alpha A, PA-C  predniSONE (DELTASONE) 10 MG tablet Take 4 tablets (40 mg total) by mouth daily for 5 days. 06/04/20 06/09/20  Nuala Alpha A, PA-C    Allergies    Albuterol sulfate, Amoxicillin-pot clavulanate, Broccoli [brassica oleracea], Covid-19 (mrna) vaccine (pfizer) [covid-19 (mrna) vaccine], Drug ingredient [acetylcysteine], Epipen [epinephrine], Fluzone, Influenza vaccines, Morphine, Penicillin g, Povidone-iodine, Sulfonamide derivatives, Atrovent [ipratropium], Ciprofloxacin, Hyoscyamine  sulfate, Latex, Meperidine hcl, Pneumococcal vaccines, Ppd [tuberculin purified protein derivative], Reglan [metoclopramide], Zostavax [zoster vaccine live], and Pepcid [famotidine]  Review of Systems   Review of Systems Ten systems are reviewed and are negative for acute change except as noted in the HPI  Physical Exam Updated Vital Signs BP (!) 151/84 (BP Location: Left Arm)   Pulse (!) 136   Temp 98 F (36.7 C) (Oral)   Resp 20   SpO2 99%   Physical Exam Constitutional:      General: She is in acute distress.     Appearance: Normal appearance. She is well-developed. She is not ill-appearing or diaphoretic.  HENT:     Head: Normocephalic and atraumatic.     Comments: Airway clear Eyes:     General: Vision grossly  intact. Gaze aligned appropriately.     Pupils: Pupils are equal, round, and reactive to light.  Neck:     Trachea: Trachea and phonation normal.  Pulmonary:     Effort: Pulmonary effort is normal. No respiratory distress.     Breath sounds: Normal air entry. Decreased breath sounds and wheezing present.     Comments: Mild diminished breath sounds, mild expiratory wheezing. Abdominal:     General: There is no distension.     Palpations: Abdomen is soft.     Tenderness: There is no abdominal tenderness. There is no guarding or rebound.  Musculoskeletal:        General: Normal range of motion.     Cervical back: Normal range of motion.  Skin:    General: Skin is warm and dry.     Findings: No rash.  Neurological:     Mental Status: She is alert.     GCS: GCS eye subscore is 4. GCS verbal subscore is 5. GCS motor subscore is 6.     Comments: Speech is clear and goal oriented, follows commands Major Cranial nerves without deficit, no facial droop Moves extremities without ataxia, coordination intact  Psychiatric:        Behavior: Behavior normal.    ED Results / Procedures / Treatments   Labs (all labs ordered are listed, but only abnormal results are  displayed) Labs Reviewed - No data to display  EKG EKG Interpretation  Date/Time:  Tuesday June 04 2020 11:02:58 EDT Ventricular Rate:  129 PR Interval:    QRS Duration: 89 QT Interval:  327 QTC Calculation: 479 R Axis:   -60 Text Interpretation: Sinus tachycardia Left anterior fascicular block Low voltage, precordial leads RSR' in V1 or V2, right VCD or RVH Consider anterior infarct tachycardia new from previous Confirmed by Theotis Burrow [phone]) on 06/04/2020 11:42:05 AM   Radiology No results found.  Procedures Procedures (including critical care time)  Medications Ordered in ED Medications  0.9 %  sodium chloride infusion ( Intravenous New Bag/Given 06/04/20 1125)  levalbuterol (XOPENEX) nebulizer solution 1.25 mg (1.25 mg Nebulization Given 06/04/20 1122)    ED Course  I have reviewed the triage vital signs and the nursing notes.  Pertinent labs & imaging results that were available during my care of the patient were reviewed by me and considered in my medical decision making (see chart for details).    MDM Rules/Calculators/A&P                         Additional history obtained from: 1. Nursing notes from this visit. ------------------  69 year old female arrives with allergic reaction after receiving first dose of Pfizer COVID-19 vaccine.  She has received epi, Solu-Medrol and Benadryl prior to arrival.  She has multiple allergies including allergies to Pepcid and albuterol.  Chart review shows that patient tolerates levalbuterol well.  This is been ordered, patient on cardiac monitor and pulse ox. Discussed case with Dr. Rex Kras who agrees with plan. - 12:13 PM: Patient reassessed, she is resting comfortably in bed no acute distress vital signs stable.  She reports resolution of symptoms following levalbuterol, she is requesting discharge.  I discussed with patient that we would like to continue monitoring her to ensure no recurrence of allergic reaction she is  agreeable at this time. - Patient was reassessed multiple times during this visit.  Reassessed patient at 2:23 PM, she is resting comfortably no acute distress vital signs stable  on room air.  She reports complete resolution of her symptoms and she is feeling well, she has no complaints that she would like to leave.  She has been observed around 3 and half hours at this point, advised patient that she is to return immediately if she has any recurrence of symptoms and she and her husband states understanding.  She has an EpiPen and denies refill.  Will start patient on prednisone/Benadryl and have her follow-up with PCP and specialist.  Of note patient without allergy to prednisone or history of diabetes.  At this time there does not appear to be any evidence of an acute emergency medical condition and the patient appears stable for discharge with appropriate outpatient follow up. Diagnosis was discussed with patient who verbalizes understanding of care plan and is agreeable to discharge. I have discussed return precautions with patientwho verbalizes understanding. Patient encouraged to follow-up with their PCP. All questions answered.  Patient's case discussed with Dr. Rex Kras who agrees with plan to discharge with prednisone/benadryl and outpatient follow-up.  - Addendum: Patient did arrive tachycardic suspect this was secondary to the epinephrine.  On reassessment tachycardia had resolved.  Note: Portions of this report may have been transcribed using voice recognition software. Every effort was made to ensure accuracy; however, inadvertent computerized transcription errors may still be present. Final Clinical Impression(s) / ED Diagnoses Final diagnoses:  Allergic reaction, initial encounter    Rx / DC Orders ED Discharge Orders         Ordered    predniSONE (DELTASONE) 10 MG tablet  Daily        06/04/20 1421    diphenhydrAMINE (BENADRYL) 25 MG tablet  Every 6 hours PRN        06/04/20 1421             Deliah Boston, PA-C 06/04/20 1429    Deliah Boston, PA-C 06/04/20 1439    Little, Wenda Overland, MD 06/04/20 [phone]

## 2020-06-04 NOTE — ED Notes (Signed)
Patient verbalizes understanding of discharge instructions. Opportunity for questioning and answers were provided. Armband removed by staff, pt discharged from ED.  

## 2020-06-04 NOTE — Telephone Encounter (Signed)
Dr Annamaria Boots, please see pt email- she had her covid vaccine today  Please let Dr. Annamaria Boots know that despite premedicating with 50 mg Benadryl and using my inhaler, I did react requiring epi pen , 25 mg Benadryl, 125 mg Iv Solumedrol & a trip to [facility] ED.

## 2020-06-05 ENCOUNTER — Encounter: Payer: Self-pay | Admitting: *Deleted

## 2020-06-13 ENCOUNTER — Encounter: Payer: Self-pay | Admitting: Internal Medicine

## 2020-06-25 ENCOUNTER — Ambulatory Visit: Payer: Medicare Other

## 2020-07-02 ENCOUNTER — Encounter: Payer: Self-pay | Admitting: Internal Medicine

## 2020-07-02 DIAGNOSIS — H905 Unspecified sensorineural hearing loss: Secondary | ICD-10-CM

## 2020-07-11 DIAGNOSIS — H6123 Impacted cerumen, bilateral: Secondary | ICD-10-CM | POA: Insufficient documentation

## 2020-07-11 DIAGNOSIS — Z872 Personal history of diseases of the skin and subcutaneous tissue: Secondary | ICD-10-CM | POA: Diagnosis not present

## 2020-07-11 DIAGNOSIS — H73892 Other specified disorders of tympanic membrane, left ear: Secondary | ICD-10-CM | POA: Diagnosis not present

## 2020-07-28 NOTE — Progress Notes (Signed)
Patient ID: [name] [name], female    DOB: 06/08/1951, 69 y.o.   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 We attempted to call to change to televisit, but couldn't reach patient, who then called back. 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 Head cold last month. Declines flu vax "allergic". Uses Dulera. Only occ need rescue inhaler.  Asks refills of Zpak and tessalon to hold, xopenex hfa and Epipen.   Observations/Objective:   Assessment and Plan: Asthma- mild persistent uncomplicated   Refills sent Multiple medication intolerance- She asks for specific meds she finds tolerated and effective.  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 Flu vax- declines "allergic" She describes reaction within minutes after initial Phizer inj,  while under observation- neck got red, throat tight, began coughing. No blood work drawn at [facility]. We discussed options- no more Covid vax, booster with J&J vaccine, Phizer booster with heavy premedication. We agreed not to seek booster now.  ROS-see HPI  + = positive Constitutional:   No-   weight loss, night sweats, fevers, chills, fatigue, lassitude. HEENT:   No-  headaches, difficulty swallowing, tooth/dental problems, sore throat,       No-  sneezing, itching, ear ache, nasal congestion, post nasal drip,  CV:  No-   chest pain, orthopnea, PND, swelling in lower extremities, anasarca,  dizziness, palpitations Resp: +  shortness of breath with exertion or at rest.              No-   productive cough,  non-productive cough,  No- coughing up of blood.              No-   change in color of mucus.  + wheezing.   Skin: No-   rash or lesions. GI:  No-   heartburn, indigestion, abdominal pain, nausea, vomiting, See HPI GU:  MS:  No-   joint pain or swelling. Neuro-     nothing unusual Psych:  No- change in mood or affect. No depression or anxiety.  No memory loss.   Objective:   Physical Exam General- Alert, Oriented, Affect-appropriate, Distress- none acute Skin- rash-none, lesions- none, excoriation- none Lymphadenopathy- none Head- atraumatic            Eyes- Gross vision intact, PERRLA, conjunctivae clear secretions            Ears- Hearing, canals            Nose-  No-Septal dev, mucus- clear, no-polyps, erosion, perforation             Throat- Mallampati II , mucosa red , drainage- none, tonsils- atrophic , + active  gag.  Neck- flexible , trachea midline, no stridor , thyroid nl, carotid no bruit Chest - symmetrical excursion , unlabored           Heart/CV- RRR , no murmur , no gallop  , no rub, nl s1 s2                           - JVD- none , edema- none, stasis changes- none, varices- none           Lung- clear to P&A, wheeze- none, cough- none , dullness-none, rub- none            Chest wall-  Abd-  Br/ Gen/ Rectal- Not done, not indicated Extrem- cyanosis- none, clubbing, none, atrophy- none, strength- nl Neuro- grossly intact to observation

## 2020-07-29 ENCOUNTER — Encounter: Payer: Self-pay | Admitting: Internal Medicine

## 2020-07-29 ENCOUNTER — Other Ambulatory Visit: Payer: Self-pay

## 2020-07-29 ENCOUNTER — Ambulatory Visit: Payer: Medicare Other | Admitting: Internal Medicine

## 2020-07-29 DIAGNOSIS — J45909 Unspecified asthma, uncomplicated: Secondary | ICD-10-CM

## 2020-07-29 DIAGNOSIS — T782XXS Anaphylactic shock, unspecified, sequela: Secondary | ICD-10-CM

## 2020-07-29 MED ORDER — LEVALBUTEROL TARTRATE 45 MCG/ACT IN AERO: 1.0000 | INHALATION_SPRAY | Freq: Four times a day (QID) | RESPIRATORY_TRACT | 12 refills | Status: DC | PRN

## 2020-07-29 NOTE — Assessment & Plan Note (Signed)
She continues to hold Dulera, Xopenex hfa for interval use as needed.  Plan- refills and med talk.

## 2020-07-29 NOTE — Assessment & Plan Note (Signed)
No objective data- labs or etc. Taking this at face value, we have agreed she should not seek repeat Covid vaccine at this time.

## 2020-08-12 ENCOUNTER — Encounter: Payer: Self-pay | Admitting: Internal Medicine

## 2020-08-12 ENCOUNTER — Ambulatory Visit (INDEPENDENT_AMBULATORY_CARE_PROVIDER_SITE_OTHER): Payer: Medicare Other | Admitting: Internal Medicine

## 2020-08-12 ENCOUNTER — Other Ambulatory Visit: Payer: Self-pay

## 2020-08-12 VITALS — BP 130/82 | HR 108 | Temp 97.9°F | Ht 63.0 in

## 2020-08-12 DIAGNOSIS — R739 Hyperglycemia, unspecified: Secondary | ICD-10-CM

## 2020-08-12 DIAGNOSIS — Z7189 Other specified counseling: Secondary | ICD-10-CM | POA: Diagnosis not present

## 2020-08-12 DIAGNOSIS — E039 Hypothyroidism, unspecified: Secondary | ICD-10-CM | POA: Diagnosis not present

## 2020-08-12 DIAGNOSIS — N1831 Chronic kidney disease, stage 3a: Secondary | ICD-10-CM | POA: Diagnosis not present

## 2020-08-12 NOTE — Patient Instructions (Signed)
Your DNR form was signed today  You may wish to also fill out the MOST form for [place] that I believe is likely online for you to print and fill out  Please continue all other medications as before, and refills have been done if requested.  Please have the pharmacy call with any other refills you may need.  Please continue your efforts at being more active, low cholesterol diet, and weight control.  Please keep your appointments with your specialists as you may have planned  Please go to the LAB at the blood drawing area for the tests to be done  You will be contacted by phone if any changes need to be made immediately.  Otherwise, you will receive a letter about your results with an explanation, but please check with MyChart first.  Please remember to sign up for MyChart if you have not done so, as this will be important to you in the future with finding out test results, communicating by private email, and scheduling acute appointments online when needed.  Please make an Appointment to return in 6 months, or sooner if needed

## 2020-08-12 NOTE — Progress Notes (Signed)
Subjective:    Patient ID: [name] [name], female    DOB: 1951-08-13, 69 y.o.   MRN: [id number]  HPI  Here to f/u post ED visit, Pt denies chest pain, increased sob or doe, wheezing, orthopnea, PND, increased LE swelling, palpitations, dizziness or syncope.  Pt denies new neurological symptoms such as new headache, or facial or extremity weakness or numbness   Pt denies polydipsia, polyuria, Denies hyper or hypo thyroid symptoms such as voice, skin or hair change.   Pt denies fever, wt loss, night sweats, loss of appetite, or other constitutional symptoms  Per pt request, wants to be DNR status for out of hospital form to be signed today.  Plans to have the MOST form filled out as well, just doesn't have this today Past Medical History:  Diagnosis Date  . ASTHMA   . BREAST CANCER, HX OF 11/1979  . CKD (chronic kidney disease) stage 3, GFR 30-59 ml/min (HCC) 03/05/2020  . GERD   . GI bleed 11/92, 11/93, 11/94   GI Bleed  . Irritable bowel syndrome   . MIGRAINE HEADACHE   . Symptomatic anemia 08/24/2018   Past Surgical History:  Procedure Laterality Date  . ABDOMINAL HYSTERECTOMY  11/1979  . BREAST SURGERY    . ESOPHAGOGASTRODUODENOSCOPY N/A 08/26/2018   Procedure: ESOPHAGOGASTRODUODENOSCOPY (EGD);  Surgeon: Doran Stabler, MD;  Location: [facility];  Service: Gastroenterology;  Laterality: N/A;  . Left wrist  1963  . MASTECTOMY  11/1979   Bilaterally & implants  . OTHER SURGICAL HISTORY Bilateral    removal of breast implants  . TONSILLECTOMY  1955  . TUBAL LIGATION  1975    reports that she has never smoked. She has never used smokeless tobacco. She reports current alcohol use. She reports that she does not use drugs. family history includes Breast cancer in her mother; Colon polyps (age of onset: 57) in her sister; Coronary artery disease in her mother; Coronary artery disease (age of onset: 63) in her father; Endometriosis in her sister; Heart disease in her brother;  Hyperlipidemia in her brother; Hypertension in her brother; Irritable bowel syndrome in her sister; Uterine cancer in her mother. Allergies  Allergen Reactions  . Albuterol Sulfate Anaphylaxis    Pt states she has tolerated levalbuterol  . Amoxicillin-Pot Clavulanate Anaphylaxis    Lips swell   . Broccoli [Brassica Oleracea] Anaphylaxis  . Covid-19 (Mrna) Vaccine Therapist, music) [Covid-19 (Mrna) Vaccine] Anaphylaxis  . Drug Ingredient [Acetylcysteine] Shortness Of Breath  . Epipen [Epinephrine] Other (See Comments)    Allergic to preservatives in Epipen with caution!!!  . Fluzone Anaphylaxis  . Influenza Vaccines Anaphylaxis  . Morphine Anaphylaxis  . Penicillin G Anaphylaxis    All cillins  . Povidone-Iodine Anaphylaxis and Hives  . Sulfonamide Derivatives Anaphylaxis  . Atrovent [Ipratropium] Other (See Comments)    Has sulfa in preservative  . Ciprofloxacin Itching    Itchy palms  . Hyoscyamine Sulfate Hives  . Latex Itching  . Meperidine Hcl Nausea And Vomiting    REACTION: N\T\V  . Pneumococcal Vaccines Other (See Comments)    Pt declines  . Ppd [Tuberculin Purified Protein Derivative] Swelling and Other (See Comments)    Arm swelling with red streaks  . Reglan [Metoclopramide] Other (See Comments)  . Zostavax [Zoster Vaccine Live] Other (See Comments)    Pt declines  . Pepcid [Famotidine] Nausea And Vomiting, Palpitations and Other (See Comments)    Due to sulfide preservative   Current Outpatient Medications on  File Prior to Visit  Medication Sig Dispense Refill  . Ascorbic Acid (VITAMIN C) 1000 MG tablet Take 1,000 mg by mouth daily.    Marland Kitchen aspirin-acetaminophen-caffeine (EXCEDRIN MIGRAINE) 250-250-65 MG per tablet Take 1 tablet by mouth every 6 (six) hours as needed. For migraine    . Cyanocobalamin (VITAMIN B 12 PO) Take 1 tablet by mouth daily.    . cyclobenzaprine (FLEXERIL) 10 MG tablet Take 1 tablet (10 mg total) by mouth 3 (three) times daily. Annual appt due in May  must see provider for future refills (Patient taking differently: Take 5-10 mg by mouth 3 (three) times daily as needed for muscle spasms. ) 60 tablet 2  . EPINEPHrine 0.3 mg/0.3 mL IJ SOAJ injection As directed for severe allergic reaction (Patient taking differently: Inject 0.3 mg into the muscle once as needed for anaphylaxis. ) 2 each 12  . levalbuterol (XOPENEX HFA) 45 MCG/ACT inhaler Inhale 1-2 puffs into the lungs every 6 (six) hours as needed. For shortness of breath 15 g 12  . levalbuterol (XOPENEX) 1.25 MG/3ML nebulizer solution Take 1.25 mg by nebulization every 4 (four) hours as needed for wheezing. 72 mL 12  . Melatonin 10 MG CAPS Take 10 mg by mouth at bedtime as needed (sleep).     . mometasone-formoterol (DULERA) 200-5 MCG/ACT AERO Inhale 2 puffs into the lungs 2 (two) times daily. Rinse mouth 1 Inhaler 2  . pantoprazole (PROTONIX) 40 MG tablet Take 1 tablet (40 mg total) by mouth daily. Annual appt due in May must see provider for future refills 90 tablet 3  . vitamin E 400 UNIT capsule Take 400 Units by mouth daily.      Marland Kitchen zolpidem (AMBIEN) 10 MG tablet Take 1 tablet (10 mg total) by mouth at bedtime. (Patient taking differently: Take 10 mg by mouth at bedtime as needed for sleep. ) 90 tablet 1  . diphenhydrAMINE (BENADRYL) 25 MG tablet Take 1 tablet (25 mg total) by mouth every 6 (six) hours as needed for up to 5 days. 20 tablet 0   No current facility-administered medications on file prior to visit.   Review of Systems All otherwise neg per pt    Objective:   Physical Exam BP 130/82 (BP Location: Left Arm, Patient Position: Sitting, Cuff Size: Large)   Pulse (!) 108   Temp 97.9 F (36.6 C) (Oral)   Ht 5\' 3"  (1.6 m)   SpO2 93%   BMI 28.63 kg/m  VS noted,  Constitutional: Pt appears in NAD HENT: Head: NCAT.  Right Ear: External ear normal.  Left Ear: External ear normal.  Eyes: . Pupils are equal, round, and reactive to light. Conjunctivae and EOM are normal Nose:  without d/c or deformity Neck: Neck supple. Gross normal ROM Cardiovascular: Normal rate and regular rhythm.   Pulmonary/Chest: Effort normal and breath sounds without rales or wheezing.  Abd:  Soft, NT, ND, + BS, no organomegaly Neurological: Pt is alert. At baseline orientation, motor grossly intact Skin: Skin is warm. No rashes, other new lesions, no LE edema Psychiatric: Pt behavior is normal without agitation  All otherwise neg per pt Lab Results  Component Value Date   WBC 9.3 03/13/2020   HGB 13.8 03/13/2020   HCT 41.2 03/13/2020   PLT 530.0 (H) 03/13/2020   GLUCOSE 102 (H) 08/14/2020   CHOL 215 (H) 08/14/2020   TRIG 344.0 (H) 08/14/2020   HDL 53.10 08/14/2020   LDLDIRECT 62.0 08/14/2020   LDLCALC 133 (H) 02/03/2017  ALT 17 08/14/2020   AST 19 08/14/2020   NA 140 08/14/2020   K 3.7 08/14/2020   CL 104 08/14/2020   CREATININE 1.10 08/14/2020   BUN 13 08/14/2020   CO2 26 08/14/2020   TSH 5.13 (H) 03/13/2020   INR 1.0 08/24/2018   HGBA1C 6.2 08/14/2020      Assessment & Plan:

## 2020-08-14 ENCOUNTER — Encounter: Payer: Self-pay | Admitting: Internal Medicine

## 2020-08-14 ENCOUNTER — Other Ambulatory Visit (INDEPENDENT_AMBULATORY_CARE_PROVIDER_SITE_OTHER): Payer: Medicare Other

## 2020-08-14 DIAGNOSIS — R739 Hyperglycemia, unspecified: Secondary | ICD-10-CM

## 2020-08-14 DIAGNOSIS — N1831 Chronic kidney disease, stage 3a: Secondary | ICD-10-CM | POA: Diagnosis not present

## 2020-08-14 DIAGNOSIS — E039 Hypothyroidism, unspecified: Secondary | ICD-10-CM

## 2020-08-14 LAB — LIPID PANEL
Cholesterol: 215 mg/dL — ABNORMAL HIGH (ref 0–200)
HDL: 53.1 mg/dL (ref >39.00)
NonHDL: 162.36
Total CHOL/HDL Ratio: 4
Triglycerides: 344 mg/dL — ABNORMAL HIGH (ref 0.0–149.0)
VLDL: 68.8 mg/dL — ABNORMAL HIGH (ref 0.0–40.0)

## 2020-08-14 LAB — BASIC METABOLIC PANEL
BUN: 13 mg/dL (ref 6–23)
CO2: 26 mEq/L (ref 19–32)
Calcium: 9.4 mg/dL (ref 8.4–10.5)
Chloride: 104 mEq/L (ref 96–112)
Creatinine, Ser: 1.1 mg/dL (ref 0.40–1.20)
GFR: 50.47 mL/min — ABNORMAL LOW (ref >60.00)
Glucose, Bld: 102 mg/dL — ABNORMAL HIGH (ref 70–99)
Potassium: 3.7 mEq/L (ref 3.5–5.1)
Sodium: 140 mEq/L (ref 135–145)

## 2020-08-14 LAB — HEPATIC FUNCTION PANEL
ALT: 17 U/L (ref 0–35)
AST: 19 U/L (ref 0–37)
Albumin: 4.4 g/dL (ref 3.5–5.2)
Alkaline Phosphatase: 57 U/L (ref 39–117)
Bilirubin, Direct: 0.1 mg/dL (ref 0.0–0.3)
Total Bilirubin: 0.4 mg/dL (ref 0.2–1.2)
Total Protein: 7.2 g/dL (ref 6.0–8.3)

## 2020-08-14 LAB — LDL CHOLESTEROL, DIRECT: Direct LDL: 62 mg/dL

## 2020-08-14 LAB — VITAMIN D 25 HYDROXY (VIT D DEFICIENCY, FRACTURES): VITD: 74.79 ng/mL (ref 30.00–100.00)

## 2020-08-14 LAB — PHOSPHORUS: Phosphorus: 2.8 mg/dL (ref 2.3–4.6)

## 2020-08-14 LAB — HEMOGLOBIN A1C: Hgb A1c MFr Bld: 6.2 % (ref 4.6–6.5)

## 2020-08-16 LAB — PTH, INTACT AND CALCIUM
Calcium: 9.5 mg/dL (ref 8.6–10.4)
PTH: 18 pg/mL (ref 14–64)

## 2020-08-18 ENCOUNTER — Encounter: Payer: Self-pay | Admitting: Internal Medicine

## 2020-08-18 DIAGNOSIS — Z7189 Other specified counseling: Secondary | ICD-10-CM | POA: Insufficient documentation

## 2020-08-18 NOTE — Assessment & Plan Note (Signed)
Pt is adamant no aggressive measures such as cpr compressions, defibrillation, epi and intubation - wants to be comfort measures only

## 2020-08-18 NOTE — Assessment & Plan Note (Signed)
stable overall by history and exam, recent data reviewed with pt, and pt to continue medical treatment as before,  to f/u any worsening symptoms or concerns  

## 2020-08-18 NOTE — Assessment & Plan Note (Signed)
Subclinical, delicnes low dose thyroid replacement today

## 2020-08-18 NOTE — Assessment & Plan Note (Addendum)

## 2020-08-29 ENCOUNTER — Ambulatory Visit: Payer: Medicare Other | Admitting: Internal Medicine

## 2020-10-15 ENCOUNTER — Other Ambulatory Visit: Payer: Self-pay | Admitting: Internal Medicine

## 2020-11-26 ENCOUNTER — Ambulatory Visit (INDEPENDENT_AMBULATORY_CARE_PROVIDER_SITE_OTHER): Payer: Medicare Other

## 2020-11-26 ENCOUNTER — Other Ambulatory Visit: Payer: Self-pay

## 2020-11-26 VITALS — BP 122/88 | HR 93 | Temp 98.2°F | Ht 63.0 in | Wt 163.2 lb

## 2020-11-26 DIAGNOSIS — Z0000 Encounter for general adult medical examination without abnormal findings: Secondary | ICD-10-CM

## 2020-11-26 NOTE — Progress Notes (Signed)
Subjective:   [name] [name] is a 70 y.o. female who presents for Medicare Annual (Subsequent) preventive examination.  Review of Systems    No ROS. Medicare Wellness Visit. Additional risk factors are reflected in social history. Cardiac Risk Factors include: advanced age (>39men, >48 women);dyslipidemia;family history of premature cardiovascular disease     Objective:    Today's Vitals   11/26/20 0815  BP: 122/88  Pulse: 93  Temp: 98.2 F (36.8 C)  SpO2: 93%  Weight: 163 lb 3.2 oz (74 kg)  Height: 5\' 3"  (1.6 m)  PainSc: 0-No pain   Body mass index is 28.91 kg/m.  Advanced Directives 11/26/2020 06/28/2019 08/25/2018 08/24/2018 08/22/2018 12/07/2015  Does Patient Have a Medical Advance Directive? Yes Yes Yes Yes Yes No  Type of Advance Directive Living will;Healthcare Power of Attorney;Out of facility DNR (pink MOST or yellow form) [place];Living will [place];Living will [place];Living will - -  Does patient want to make changes to medical advance directive? No - Patient declined - No - Patient declined - No - Patient declined -  Copy of [place] in Chart? No - copy requested No - copy requested Yes - validated most recent copy scanned in chart (See row information) - - -    Current Medications (verified) Outpatient Encounter Medications as of 11/26/2020  Medication Sig  . Ascorbic Acid (VITAMIN C) 1000 MG tablet Take 1,000 mg by mouth daily.  Marland Kitchen aspirin-acetaminophen-caffeine (EXCEDRIN MIGRAINE) 250-250-65 MG per tablet Take 1 tablet by mouth every 6 (six) hours as needed. For migraine  . Cyanocobalamin (VITAMIN B 12 PO) Take 1 tablet by mouth daily.  . cyclobenzaprine (FLEXERIL) 10 MG tablet Take 1 tablet (10 mg total) by mouth 3 (three) times daily. Annual appt due in May must see provider for future refills (Patient taking differently: Take 5-10 mg by mouth 3 (three) times daily as needed  for muscle spasms. )  . diphenhydrAMINE (BENADRYL) 25 MG tablet Take 1 tablet (25 mg total) by mouth every 6 (six) hours as needed for up to 5 days.  Marland Kitchen EPINEPHrine 0.3 mg/0.3 mL IJ SOAJ injection As directed for severe allergic reaction (Patient taking differently: Inject 0.3 mg into the muscle once as needed for anaphylaxis. )  . levalbuterol (XOPENEX HFA) 45 MCG/ACT inhaler Inhale 1-2 puffs into the lungs every 6 (six) hours as needed. For shortness of breath  . levalbuterol (XOPENEX) 1.25 MG/3ML nebulizer solution Take 1.25 mg by nebulization every 4 (four) hours as needed for wheezing.  . Melatonin 10 MG CAPS Take 10 mg by mouth at bedtime as needed (sleep).   . mometasone-formoterol (DULERA) 200-5 MCG/ACT AERO Inhale 2 puffs into the lungs 2 (two) times daily. Rinse mouth  . pantoprazole (PROTONIX) 40 MG tablet Take 1 tablet (40 mg total) by mouth daily. Annual appt due in May must see provider for future refills  . vitamin E 400 UNIT capsule Take 400 Units by mouth daily.    Marland Kitchen zolpidem (AMBIEN) 10 MG tablet TAKE 1 TABLET BY MOUTH AT BEDTIME   No facility-administered encounter medications on file as of 11/26/2020.    Allergies (verified) Albuterol sulfate, Amoxicillin-pot clavulanate, Broccoli [brassica oleracea], Covid-19 (mrna) vaccine (pfizer) [covid-19 (mrna) vaccine], Drug ingredient [acetylcysteine], Epipen [epinephrine], Fluzone, Influenza vaccines, Morphine, Penicillin g, Povidone-iodine, Sulfonamide derivatives, Atrovent [ipratropium], Ciprofloxacin, Hyoscyamine sulfate, Latex, Meperidine hcl, Pneumococcal vaccines, Ppd [tuberculin purified protein derivative], Reglan [metoclopramide], Zostavax [zoster vaccine live], and Pepcid [  famotidine]   History: Past Medical History:  Diagnosis Date  . ASTHMA   . BREAST CANCER, HX OF 11/1979  . CKD (chronic kidney disease) stage 3, GFR 30-59 ml/min (HCC) 03/05/2020  . GERD   . GI bleed 11/92, 11/93, 11/94   GI Bleed  . Irritable bowel  syndrome   . MIGRAINE HEADACHE   . Symptomatic anemia 08/24/2018   Past Surgical History:  Procedure Laterality Date  . ABDOMINAL HYSTERECTOMY  11/1979  . BREAST SURGERY    . ESOPHAGOGASTRODUODENOSCOPY N/A 08/26/2018   Procedure: ESOPHAGOGASTRODUODENOSCOPY (EGD);  Surgeon: Doran Stabler, MD;  Location: [facility];  Service: Gastroenterology;  Laterality: N/A;  . Left wrist  1963  . MASTECTOMY  11/1979   Bilaterally & implants  . OTHER SURGICAL HISTORY Bilateral    removal of breast implants  . TONSILLECTOMY  1955  . TUBAL LIGATION  1975   Family History  Problem Relation Age of Onset  . Coronary artery disease Father 38       CABG age 78  . Breast cancer Mother   . Uterine cancer Mother   . Coronary artery disease Mother   . Endometriosis Sister   . Colon polyps Sister 66       1/2 sister - same dad  . Irritable bowel syndrome Sister   . Hyperlipidemia Brother        1/2 bro, same dad  . Hypertension Brother        1/2 bro, same dad  . Heart disease Brother   . Colon cancer Neg Hx   . Liver cancer Neg Hx    Social History   Socioeconomic History  . Marital status: Married    Spouse name: Not on file  . Number of children: 1  . Years of education: Not on file  . Highest education level: Not on file  Occupational History  . Occupation: retired  Tobacco Use  . Smoking status: Never Smoker  . Smokeless tobacco: Never Used  Vaping Use  . Vaping Use: Never used  Substance and Sexual Activity  . Alcohol use: Yes    Alcohol/week: 0.0 standard drinks    Comment: occ  . Drug use: No  . Sexual activity: Not on file  Other Topics Concern  . Not on file  Social History Narrative   Married, lives at home with spouse.    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 Difficulty of Paying Living Expenses: Not hard at all  Food Insecurity: No Food  Insecurity  . Worried About Charity fundraiser in the Last Year: Never true  . Ran Out of Food in the Last Year: Never true  Transportation Needs: No Transportation Needs  . Lack of Transportation (Medical): No  . Lack of Transportation (Non-Medical): No  Physical Activity: Sufficiently Active  . Days of Exercise per Week: 5 days  . Minutes of Exercise per Session: 30 min  Stress: No Stress Concern Present  . Feeling of Stress : Not at all  Social Connections: Socially Integrated  . Frequency of Communication with Friends and Family: More than three times a week  . Frequency of Social Gatherings with Friends and Family: Once a week  . Attends Religious Services: More than 4 times per year  . Active Member of Clubs or Organizations: Yes  . Attends Archivist Meetings:  More than 4 times per year  . Marital Status: Married    Tobacco Counseling Counseling given: Not Answered   Clinical Intake:  Pre-visit preparation completed: Yes  Pain : No/denies pain Pain Score: 0-No pain     BMI - recorded: 28.91 Nutritional Status: BMI 25 -29 Overweight Nutritional Risks: None Diabetes: No  What is the last grade level you completed in school?: Associates Degree in Nursing  Diabetic? no  Interpreter Needed?: No  Information entered by :: Lisette Abu, LPN   Activities of Daily Living In your present state of health, do you have any difficulty performing the following activities: 11/26/2020  Hearing? N  Vision? N  Difficulty concentrating or making decisions? N  Walking or climbing stairs? N  Dressing or bathing? N  Doing errands, shopping? N  Preparing Food and eating ? N  Using the Toilet? N  In the past six months, have you accidently leaked urine? N  Do you have problems with loss of bowel control? N  Managing your Medications? N  Managing your Finances? N  Housekeeping or managing your Housekeeping? N  Some recent data might be hidden    Patient  Care Team: Biagio Borg, MD as PCP - General (Internal Medicine) Deneise Lever, MD as Consulting Physician (Pulmonary Disease) Servando Salina, MD (Obstetrics and Gynecology) Loletha Carrow Kirke Corin, MD as Consulting Physician (Gastroenterology)  Indicate any recent Medical Services you may have received from other than [facility] providers in the past year (date may be approximate).     Assessment:   This is a routine wellness examination for [name] [name].  Hearing/Vision screen No exam data present  Dietary issues and exercise activities discussed: Current Exercise Habits: Home exercise routine, Time (Minutes): 30, Frequency (Times/Week): 5, Weekly Exercise (Minutes/Week): 150, Intensity: Mild, Exercise limited by: respiratory conditions(s)  Goals    . Patient Stated    . Patient Stated     Maintain current health status.      Depression Screen PHQ 2/9 Scores 11/26/2020 03/05/2020 06/28/2019 02/10/2019 02/04/2018 02/03/2017 11/20/2015  PHQ - 2 Score 0 0 0 0 0 0 0  PHQ- 9 Score - - - - - 0 -    Fall Risk Fall Risk  11/26/2020 03/05/2020 06/28/2019 02/10/2019 02/04/2018  Falls in the past year? 0 0 1 1 No  Number falls in past yr: 0 - 0 0 -  Injury with Fall? 0 - 0 0 -  Risk for fall due to : No Fall Risks - - - -  Follow up Falls evaluation completed - - - -    FALL RISK PREVENTION PERTAINING TO THE HOME:  Any stairs in or around the home? No  If so, are there any without handrails? No  Home free of loose throw rugs in walkways, pet beds, electrical cords, etc? Yes  Adequate lighting in your home to reduce risk of falls? Yes   ASSISTIVE DEVICES UTILIZED TO PREVENT FALLS:  Life alert? No  Use of a cane, walker or w/c? No  Grab bars in the bathroom? No  Shower chair or bench in shower? Yes  Elevated toilet seat or a handicapped toilet? Yes   TIMED UP AND GO:  Was the test performed? No .  Length of time to ambulate 10 feet: 0 sec.   Gait steady and fast without use of assistive  device  Cognitive Function: Normal cognitive status assessed by direct observation by this Nurse Health Advisor. No abnormalities found.  Immunizations Immunization History  Administered Date(s) Administered  . Influenza Whole 09/14/2006  . PFIZER(Purple Top)SARS-COV-2 Vaccination 06/04/2020  . Td 09/15/2007    TDAP status: Due, Education has been provided regarding the importance of this vaccine. Advised may receive this vaccine at local pharmacy or Health Dept. Aware to provide a copy of the vaccination record if obtained from local pharmacy or Health Dept. Verbalized acceptance and understanding.  (patient declined all vaccinations) Flu Vaccine status: Declined, Education has been provided regarding the importance of this vaccine but patient still declined. Advised may receive this vaccine at local pharmacy or Health Dept. Aware to provide a copy of the vaccination record if obtained from local pharmacy or Health Dept. Verbalized acceptance and understanding.  Pneumococcal vaccine status: Declined,  Education has been provided regarding the importance of this vaccine but patient still declined. Advised may receive this vaccine at local pharmacy or Health Dept. Aware to provide a copy of the vaccination record if obtained from local pharmacy or Health Dept. Verbalized acceptance and understanding.   Covid-19 vaccine status: Declined, Education has been provided regarding the importance of this vaccine but patient still declined. Advised may receive this vaccine at local pharmacy or Health Dept.or vaccine clinic. Aware to provide a copy of the vaccination record if obtained from local pharmacy or Health Dept. Verbalized acceptance and understanding.  Qualifies for Shingles Vaccine? Yes   Zostavax completed No   Shingrix Completed?: No.    Education has been provided regarding the importance of this vaccine. Patient has been advised to call insurance company to determine out of  pocket expense if they have not yet received this vaccine. Advised may also receive vaccine at local pharmacy or Health Dept. Verbalized acceptance and understanding.  Screening Tests Health Maintenance  Topic Date Due  . COVID-19 Vaccine (2 - Pfizer risk 4-dose series) 06/25/2020  . TETANUS/TDAP  03/05/2021 (Originally 09/14/2017)  . DEXA SCAN  Completed  . Hepatitis C Screening  Completed  . HPV VACCINES  Aged Out  . MAMMOGRAM  Discontinued    Health Maintenance  Health Maintenance Due  Topic Date Due  . COVID-19 Vaccine (2 - Pfizer risk 4-dose series) 06/25/2020    Colorectal cancer screening: No longer required.   Mammogram status: No longer required due to history of breast cancer.  Bone Density status: Completed 02/03/2017. Results reflect: Bone density results: OSTEOPENIA. Repeat every 2-3 years.  Lung Cancer Screening: (Low Dose CT Chest recommended if Age 43-80 years, 30 pack-year currently smoking OR have quit w/in 15years.) does not qualify.   Lung Cancer Screening Referral: no  Additional Screening:  Hepatitis C Screening: does qualify; Completed yes  Vision Screening: Recommended annual ophthalmology exams for early detection of glaucoma and other disorders of the eye. Is the patient up to date with their annual eye exam?  Yes  Who is the provider or what is the name of the office in which the patient attends annual eye exams? Gala Romney, MD. If pt is not established with a provider, would they like to be referred to a provider to establish care? No .   Dental Screening: Recommended annual dental exams for proper oral hygiene  Community Resource Referral / Chronic Care Management: CRR required this visit?  No   CCM required this visit?  No      Plan:     I have personally reviewed and noted the following in the patient's chart:   . Medical and social history . Use of alcohol, tobacco  or illicit drugs  . Current medications and  supplements . Functional ability and status . Nutritional status . Physical activity . Advanced directives . List of other physicians . Hospitalizations, surgeries, and ER visits in previous 12 months . Vitals . Screenings to include cognitive, depression, and falls . Referrals and appointments  In addition, I have reviewed and discussed with patient certain preventive protocols, quality metrics, and best practice recommendations. A written personalized care plan for preventive services as well as general preventive health recommendations were provided to patient.     Sheral Flow, LPN   [id number]   Nurse Notes:  Medications reviewed with patient; no opioid use noted.

## 2020-11-26 NOTE — Patient Instructions (Signed)
[name] [name] , Thank you for taking time to come for your Medicare Wellness Visit. I appreciate your ongoing commitment to your health goals. Please review the following plan we discussed and let me know if I can assist you in the future.   Screening recommendations/referrals: Colonoscopy: 09/16/2018; no repeat  Mammogram: discontinued Bone Density: 02/03/2017; due every 2 years Recommended yearly ophthalmology/optometry visit for glaucoma screening and checkup Recommended yearly dental visit for hygiene and checkup  Vaccinations: Influenza vaccine: declined Pneumococcal vaccine: declined Tdap vaccine: declined Shingles vaccine: declined   Covid-19: declined  Advanced directives: Please bring a copy of your health care power of attorney and living will to the office at your convenience.  Conditions/risks identified: Yes; Reviewed health maintenance screenings with patient today and relevant education, vaccines, and/or referrals were provided. Please continue to do your personal lifestyle choices by: daily care of teeth and gums, regular physical activity (goal should be 5 days a week for 30 minutes), eat a healthy diet, avoid tobacco and drug use, limiting any alcohol intake, taking a low-dose aspirin (if not allergic or have been advised by your provider otherwise) and taking vitamins and minerals as recommended by your provider. Continue doing brain stimulating activities (puzzles, reading, adult coloring books, staying active) to keep memory sharp. Continue to eat heart healthy diet (full of fruits, vegetables, whole grains, lean protein, water--limit salt, fat, and sugar intake) and increase physical activity as tolerated.  Next appointment: Please schedule your next Medicare Wellness Visit with your Nurse Health Advisor in 1 year by calling [phone].   Preventive Care 72 Years and Older, Female Preventive care refers to lifestyle choices and visits with your health care provider that can  promote health and wellness. What does preventive care include?  A yearly physical exam. This is also called an annual well check.  Dental exams once or twice a year.  Routine eye exams. Ask your health care provider how often you should have your eyes checked.  Personal lifestyle choices, including:  Daily care of your teeth and gums.  Regular physical activity.  Eating a healthy diet.  Avoiding tobacco and drug use.  Limiting alcohol use.  Practicing safe sex.  Taking low-dose aspirin every day.  Taking vitamin and mineral supplements as recommended by your health care provider. What happens during an annual well check? The services and screenings done by your health care provider during your annual well check will depend on your age, overall health, lifestyle risk factors, and family history of disease. Counseling  Your health care provider may ask you questions about your:  Alcohol use.  Tobacco use.  Drug use.  Emotional well-being.  Home and relationship well-being.  Sexual activity.  Eating habits.  History of falls.  Memory and ability to understand (cognition).  Work and work Statistician.  Reproductive health. Screening  You may have the following tests or measurements:  Height, weight, and BMI.  Blood pressure.  Lipid and cholesterol levels. These may be checked every 5 years, or more frequently if you are over 72 years old.  Skin check.  Lung cancer screening. You may have this screening every year starting at age 58 if you have a 30-pack-year history of smoking and currently smoke or have quit within the past 15 years.  Fecal occult blood test (FOBT) of the stool. You may have this test every year starting at age 72.  Flexible sigmoidoscopy or colonoscopy. You may have a sigmoidoscopy every 5 years or a colonoscopy every 10  years starting at age 72.  Hepatitis C blood test.  Hepatitis B blood test.  Sexually transmitted disease  (STD) testing.  Diabetes screening. This is done by checking your blood sugar (glucose) after you have not eaten for a while (fasting). You may have this done every 1-3 years.  Bone density scan. This is done to screen for osteoporosis. You may have this done starting at age 72.  Mammogram. This may be done every 1-2 years. Talk to your health care provider about how often you should have regular mammograms. Talk with your health care provider about your test results, treatment options, and if necessary, the need for more tests. Vaccines  Your health care provider may recommend certain vaccines, such as:  Influenza vaccine. This is recommended every year.  Tetanus, diphtheria, and acellular pertussis (Tdap, Td) vaccine. You may need a Td booster every 10 years.  Zoster vaccine. You may need this after age 72.  Pneumococcal 13-valent conjugate (PCV13) vaccine. One dose is recommended after age 72.  Pneumococcal polysaccharide (PPSV23) vaccine. One dose is recommended after age 72. Talk to your health care provider about which screenings and vaccines you need and how often you need them. This information is not intended to replace advice given to you by your health care provider. Make sure you discuss any questions you have with your health care provider. Document Released: 09/27/2015 Document Revised: 05/20/2016 Document Reviewed: 07/02/2015 Elsevier Interactive Patient Education  2017 [place] Prevention in the Home Falls can cause injuries. They can happen to people of all ages. There are many things you can do to make your home safe and to help prevent falls. What can I do on the outside of my home?  Regularly fix the edges of walkways and driveways and fix any cracks.  Remove anything that might make you trip as you walk through a door, such as a raised step or threshold.  Trim any bushes or trees on the path to your home.  Use bright outdoor lighting.  Clear any  walking paths of anything that might make someone trip, such as rocks or tools.  Regularly check to see if handrails are loose or broken. Make sure that both sides of any steps have handrails.  Any raised decks and porches should have guardrails on the edges.  Have any leaves, snow, or ice cleared regularly.  Use sand or salt on walking paths during winter.  Clean up any spills in your garage right away. This includes oil or grease spills. What can I do in the bathroom?  Use night lights.  Install grab bars by the toilet and in the tub and shower. Do not use towel bars as grab bars.  Use non-skid mats or decals in the tub or shower.  If you need to sit down in the shower, use a plastic, non-slip stool.  Keep the floor dry. Clean up any water that spills on the floor as soon as it happens.  Remove soap buildup in the tub or shower regularly.  Attach bath mats securely with double-sided non-slip rug tape.  Do not have throw rugs and other things on the floor that can make you trip. What can I do in the bedroom?  Use night lights.  Make sure that you have a light by your bed that is easy to reach.  Do not use any sheets or blankets that are too big for your bed. They should not hang down onto the floor.  Have  a firm chair that has side arms. You can use this for support while you get dressed.  Do not have throw rugs and other things on the floor that can make you trip. What can I do in the kitchen?  Clean up any spills right away.  Avoid walking on wet floors.  Keep items that you use a lot in easy-to-reach places.  If you need to reach something above you, use a strong step stool that has a grab bar.  Keep electrical cords out of the way.  Do not use floor polish or wax that makes floors slippery. If you must use wax, use non-skid floor wax.  Do not have throw rugs and other things on the floor that can make you trip. What can I do with my stairs?  Do not leave  any items on the stairs.  Make sure that there are handrails on both sides of the stairs and use them. Fix handrails that are broken or loose. Make sure that handrails are as long as the stairways.  Check any carpeting to make sure that it is firmly attached to the stairs. Fix any carpet that is loose or worn.  Avoid having throw rugs at the top or bottom of the stairs. If you do have throw rugs, attach them to the floor with carpet tape.  Make sure that you have a light switch at the top of the stairs and the bottom of the stairs. If you do not have them, ask someone to add them for you. What else can I do to help prevent falls?  Wear shoes that:  Do not have high heels.  Have rubber bottoms.  Are comfortable and fit you well.  Are closed at the toe. Do not wear sandals.  If you use a stepladder:  Make sure that it is fully opened. Do not climb a closed stepladder.  Make sure that both sides of the stepladder are locked into place.  Ask someone to hold it for you, if possible.  Clearly mark and make sure that you can see:  Any grab bars or handrails.  First and last steps.  Where the edge of each step is.  Use tools that help you move around (mobility aids) if they are needed. These include:  Canes.  Walkers.  Scooters.  Crutches.  Turn on the lights when you go into a dark area. Replace any light bulbs as soon as they burn out.  Set up your furniture so you have a clear path. Avoid moving your furniture around.  If any of your floors are uneven, fix them.  If there are any pets around you, be aware of where they are.  Review your medicines with your doctor. Some medicines can make you feel dizzy. This can increase your chance of falling. Ask your doctor what other things that you can do to help prevent falls. This information is not intended to replace advice given to you by your health care provider. Make sure you discuss any questions you have with your  health care provider. Document Released: 06/27/2009 Document Revised: 02/06/2016 Document Reviewed: 10/05/2014 Elsevier Interactive Patient Education  2017 [employer].

## 2020-12-25 ENCOUNTER — Telehealth (INDEPENDENT_AMBULATORY_CARE_PROVIDER_SITE_OTHER): Payer: Medicare Other | Admitting: Internal Medicine

## 2020-12-25 DIAGNOSIS — J209 Acute bronchitis, unspecified: Secondary | ICD-10-CM | POA: Diagnosis not present

## 2020-12-25 DIAGNOSIS — R739 Hyperglycemia, unspecified: Secondary | ICD-10-CM | POA: Diagnosis not present

## 2020-12-25 DIAGNOSIS — R062 Wheezing: Secondary | ICD-10-CM | POA: Diagnosis not present

## 2020-12-25 MED ORDER — AZITHROMYCIN 250 MG PO TABS: ORAL_TABLET | ORAL | 1 refills | Status: DC

## 2020-12-25 MED ORDER — BENZONATATE 100 MG PO CAPS: 100.0000 mg | ORAL_CAPSULE | Freq: Two times a day (BID) | ORAL | 1 refills | Status: DC | PRN

## 2020-12-25 NOTE — Progress Notes (Signed)
Patient ID: [name] [name], female   DOB: December 17, 1950, 70 y.o.   MRN: [id number]  Virtual Visit via Video Note  I connected with [name] [name] on 12/28/20 at 11:00 AM EDT by a video enabled telemedicine application and verified that I am speaking with the correct person using two identifiers.  Location of all participants today Patient: at home Provider: at office   I discussed the limitations of evaluation and management by telemedicine and the availability of in person appointments. The patient expressed understanding and agreed to proceed.  History of Present Illness: Here with acute onset mild to mod 2-3 days ST, HA, general weakness and malaise, with prod cough greenish sputum, but Pt denies chest pain, increased sob or doe, wheezing, orthopnea, PND, increased LE swelling, palpitations, dizziness or syncope except for mild wheezing, sob/doe since last PM   Pt denies polydipsia, polyuria.   Pt denies wt loss, night sweats, loss of appetite, or other constitutional symptoms   Past Medical History:  Diagnosis Date  . ASTHMA   . BREAST CANCER, HX OF 11/1979  . CKD (chronic kidney disease) stage 3, GFR 30-59 ml/min (HCC) 03/05/2020  . GERD   . GI bleed 11/92, 11/93, 11/94   GI Bleed  . Irritable bowel syndrome   . MIGRAINE HEADACHE   . Symptomatic anemia 08/24/2018   Past Surgical History:  Procedure Laterality Date  . ABDOMINAL HYSTERECTOMY  11/1979  . BREAST SURGERY    . ESOPHAGOGASTRODUODENOSCOPY N/A 08/26/2018   Procedure: ESOPHAGOGASTRODUODENOSCOPY (EGD);  Surgeon: Doran Stabler, MD;  Location: [facility];  Service: Gastroenterology;  Laterality: N/A;  . Left wrist  1963  . MASTECTOMY  11/1979   Bilaterally & implants  . OTHER SURGICAL HISTORY Bilateral    removal of breast implants  . TONSILLECTOMY  1955  . TUBAL LIGATION  1975    reports that she has never smoked. She has never used smokeless tobacco. She reports current alcohol use. She reports that she does not use  drugs. family history includes Breast cancer in her mother; Colon polyps (age of onset: 63) in her sister; Coronary artery disease in her mother; Coronary artery disease (age of onset: 40) in her father; Endometriosis in her sister; Heart disease in her brother; Hyperlipidemia in her brother; Hypertension in her brother; Irritable bowel syndrome in her sister; Uterine cancer in her mother. Allergies  Allergen Reactions  . Albuterol Sulfate Anaphylaxis    Pt states she has tolerated levalbuterol  . Amoxicillin-Pot Clavulanate Anaphylaxis    Lips swell   . Broccoli [Brassica Oleracea] Anaphylaxis  . Covid-19 (Mrna) Vaccine Therapist, music) [Covid-19 (Mrna) Vaccine] Anaphylaxis  . Drug Ingredient [Acetylcysteine] Shortness Of Breath  . Epipen [Epinephrine] Other (See Comments)    Allergic to preservatives in Epipen with caution!!!  . Fluzone Anaphylaxis  . Influenza Vaccines Anaphylaxis  . Morphine Anaphylaxis  . Penicillin G Anaphylaxis    All cillins  . Povidone-Iodine Anaphylaxis and Hives  . Sulfonamide Derivatives Anaphylaxis  . Atrovent [Ipratropium] Other (See Comments)    Has sulfa in preservative  . Ciprofloxacin Itching    Itchy palms  . Hyoscyamine Sulfate Hives  . Latex Itching  . Meperidine Hcl Nausea And Vomiting    REACTION: N\T\V  . Pneumococcal Vaccines Other (See Comments)    Pt declines  . Ppd [Tuberculin Purified Protein Derivative] Swelling and Other (See Comments)    Arm swelling with red streaks  . Reglan [Metoclopramide] Other (See Comments)  . Zostavax [Zoster Vaccine Live]  Other (See Comments)    Pt declines  . Pepcid [Famotidine] Nausea And Vomiting, Palpitations and Other (See Comments)    Due to sulfide preservative   Current Outpatient Medications on File Prior to Visit  Medication Sig Dispense Refill  . Ascorbic Acid (VITAMIN C) 1000 MG tablet Take 1,000 mg by mouth daily.    Marland Kitchen aspirin-acetaminophen-caffeine (EXCEDRIN MIGRAINE) 250-250-65 MG per tablet  Take 1 tablet by mouth every 6 (six) hours as needed. For migraine    . Cyanocobalamin (VITAMIN B 12 PO) Take 1 tablet by mouth daily.    . cyclobenzaprine (FLEXERIL) 10 MG tablet Take 1 tablet (10 mg total) by mouth 3 (three) times daily. Annual appt due in May must see provider for future refills (Patient taking differently: Take 5-10 mg by mouth 3 (three) times daily as needed for muscle spasms. ) 60 tablet 2  . diphenhydrAMINE (BENADRYL) 25 MG tablet Take 1 tablet (25 mg total) by mouth every 6 (six) hours as needed for up to 5 days. 20 tablet 0  . EPINEPHrine 0.3 mg/0.3 mL IJ SOAJ injection As directed for severe allergic reaction (Patient taking differently: Inject 0.3 mg into the muscle once as needed for anaphylaxis. ) 2 each 12  . levalbuterol (XOPENEX HFA) 45 MCG/ACT inhaler Inhale 1-2 puffs into the lungs every 6 (six) hours as needed. For shortness of breath 15 g 12  . levalbuterol (XOPENEX) 1.25 MG/3ML nebulizer solution Take 1.25 mg by nebulization every 4 (four) hours as needed for wheezing. 72 mL 12  . Melatonin 10 MG CAPS Take 10 mg by mouth at bedtime as needed (sleep).     . mometasone-formoterol (DULERA) 200-5 MCG/ACT AERO Inhale 2 puffs into the lungs 2 (two) times daily. Rinse mouth 1 Inhaler 2  . pantoprazole (PROTONIX) 40 MG tablet Take 1 tablet (40 mg total) by mouth daily. Annual appt due in May must see provider for future refills 90 tablet 3  . vitamin E 400 UNIT capsule Take 400 Units by mouth daily.      Marland Kitchen zolpidem (AMBIEN) 10 MG tablet TAKE 1 TABLET BY MOUTH AT BEDTIME 90 tablet 1   No current facility-administered medications on file prior to visit.    Observations/Objective: Alert, NAD, appropriate mood and affect, resps normal, cn 2-12 intact, moves all 4s, no visible rash or swelling Lab Results  Component Value Date   WBC 9.3 03/13/2020   HGB 13.8 03/13/2020   HCT 41.2 03/13/2020   PLT 530.0 (H) 03/13/2020   GLUCOSE 102 (H) 08/14/2020   CHOL 215 (H)  08/14/2020   TRIG 344.0 (H) 08/14/2020   HDL 53.10 08/14/2020   LDLDIRECT 62.0 08/14/2020   LDLCALC 133 (H) 02/03/2017   ALT 17 08/14/2020   AST 19 08/14/2020   NA 140 08/14/2020   K 3.7 08/14/2020   CL 104 08/14/2020   CREATININE 1.10 08/14/2020   BUN 13 08/14/2020   CO2 26 08/14/2020   TSH 5.13 (H) 03/13/2020   INR 1.0 08/24/2018   HGBA1C 6.2 08/14/2020   Assessment and Plan: See notes  Follow Up Instructions: See notes   I discussed the assessment and treatment plan with the patient. The patient was provided an opportunity to ask questions and all were answered. The patient agreed with the plan and demonstrated an understanding of the instructions.   The patient was advised to call back or seek an in-person evaluation if the symptoms worsen or if the condition fails to improve as anticipated.  oJames  Jenny Reichmann, MD

## 2020-12-28 ENCOUNTER — Encounter: Payer: Self-pay | Admitting: Internal Medicine

## 2020-12-28 DIAGNOSIS — J209 Acute bronchitis, unspecified: Secondary | ICD-10-CM | POA: Insufficient documentation

## 2020-12-28 NOTE — Patient Instructions (Signed)
Please take all new medication as prescribed 

## 2020-12-28 NOTE — Assessment & Plan Note (Signed)
Mild, I suggested prednisone course but she declines, but will call if changes her mind

## 2020-12-28 NOTE — Assessment & Plan Note (Signed)
Mild to mod, for antibx course, and tessalon perle, to f/u any worsening symptoms or concerns

## 2020-12-28 NOTE — Assessment & Plan Note (Signed)
Lab Results  Component Value Date   HGBA1C 6.2 08/14/2020   Stable, pt to continue current medical treatment  - diet

## 2020-12-30 ENCOUNTER — Telehealth: Payer: Self-pay | Admitting: Internal Medicine

## 2020-12-30 MED ORDER — METHYLPREDNISOLONE 4 MG PO TBPK: ORAL_TABLET | ORAL | 0 refills | Status: DC

## 2020-12-30 MED ORDER — AZITHROMYCIN 250 MG PO TABS: ORAL_TABLET | ORAL | 0 refills | Status: DC

## 2020-12-30 MED ORDER — BENZONATATE 100 MG PO CAPS: 100.0000 mg | ORAL_CAPSULE | Freq: Two times a day (BID) | ORAL | 1 refills | Status: DC | PRN

## 2020-12-30 NOTE — Telephone Encounter (Signed)
Ok this is done 

## 2020-12-30 NOTE — Telephone Encounter (Signed)
[facility] Report/Call: ---Caller states that she is wanting medication called in for bronchitis. She was diagnosed on Tuesday. Placed on zpack and tessalon perrls, Was offered a medrol dose pack and didn't want it but feels needs it now. Now having wheezing.

## 2020-12-30 NOTE — Telephone Encounter (Signed)
Patient was seen on 04.13.22 and was offered steroids but she declined, she is still sick and she is wondering if she can get the steroids and another round of antibiotics .

## 2020-12-31 ENCOUNTER — Ambulatory Visit (INDEPENDENT_AMBULATORY_CARE_PROVIDER_SITE_OTHER): Payer: Medicare Other | Admitting: Internal Medicine

## 2020-12-31 ENCOUNTER — Encounter: Payer: Self-pay | Admitting: Internal Medicine

## 2020-12-31 ENCOUNTER — Ambulatory Visit (INDEPENDENT_AMBULATORY_CARE_PROVIDER_SITE_OTHER): Payer: Medicare Other

## 2020-12-31 ENCOUNTER — Other Ambulatory Visit: Payer: Self-pay

## 2020-12-31 VITALS — BP 160/96 | HR 101 | Temp 97.8°F | Ht 63.0 in | Wt 163.4 lb

## 2020-12-31 DIAGNOSIS — J45909 Unspecified asthma, uncomplicated: Secondary | ICD-10-CM | POA: Diagnosis not present

## 2020-12-31 DIAGNOSIS — R739 Hyperglycemia, unspecified: Secondary | ICD-10-CM | POA: Diagnosis not present

## 2020-12-31 DIAGNOSIS — J4550 Severe persistent asthma, uncomplicated: Secondary | ICD-10-CM | POA: Diagnosis not present

## 2020-12-31 DIAGNOSIS — R030 Elevated blood-pressure reading, without diagnosis of hypertension: Secondary | ICD-10-CM | POA: Diagnosis not present

## 2020-12-31 DIAGNOSIS — N1831 Chronic kidney disease, stage 3a: Secondary | ICD-10-CM | POA: Diagnosis not present

## 2020-12-31 DIAGNOSIS — R059 Cough, unspecified: Secondary | ICD-10-CM | POA: Diagnosis not present

## 2020-12-31 DIAGNOSIS — R062 Wheezing: Secondary | ICD-10-CM | POA: Diagnosis not present

## 2020-12-31 DIAGNOSIS — J9811 Atelectasis: Secondary | ICD-10-CM | POA: Diagnosis not present

## 2020-12-31 MED ORDER — HYDROCODONE-HOMATROPINE 5-1.5 MG/5ML PO SYRP: 5.0000 mL | ORAL_SOLUTION | Freq: Four times a day (QID) | ORAL | 0 refills | Status: AC | PRN

## 2020-12-31 MED ORDER — LEVOFLOXACIN 500 MG PO TABS: 500.0000 mg | ORAL_TABLET | Freq: Every day | ORAL | 0 refills | Status: AC

## 2020-12-31 MED ORDER — METHYLPREDNISOLONE ACETATE 80 MG/ML IJ SUSP
80.0000 mg | Freq: Once | INTRAMUSCULAR | Status: AC
  Administered 2020-12-31: 80 mg via INTRAMUSCULAR

## 2020-12-31 NOTE — Progress Notes (Signed)
Patient ID: [name] [name], female   DOB: 11-Oct-1950, 70 y.o.   MRN: [id number]        Chief Complaint: cough with wheezing       HPI:  [name] [name] is a 70 y.o. female here with c/o Here with acute onset mild to mod 2-3 days ST, HA, general weakness and malaise, with prod cough greenish sputum, but Pt denies chest pain, increased sob or doe, wheezing, orthopnea, PND, increased LE swelling, palpitations, dizziness or syncope except for onset mild sob and wheezing since last pm.  Denies new neuro focal s/s.   Pt denies polydipsia, polyuria.  BP at home usually < 140/90       Wt Readings from Last 3 Encounters:  12/31/20 163 lb 6.4 oz (74.1 kg)  11/26/20 163 lb 3.2 oz (74 kg)  07/29/20 161 lb 9.6 oz (73.3 kg)   BP Readings from Last 3 Encounters:  12/31/20 (!) 160/96  11/26/20 122/88  08/12/20 130/82         Past Medical History:  Diagnosis Date  . ASTHMA   . BREAST CANCER, HX OF 11/1979  . CKD (chronic kidney disease) stage 3, GFR 30-59 ml/min (HCC) 03/05/2020  . GERD   . GI bleed 11/92, 11/93, 11/94   GI Bleed  . Irritable bowel syndrome   . MIGRAINE HEADACHE   . Symptomatic anemia 08/24/2018   Past Surgical History:  Procedure Laterality Date  . ABDOMINAL HYSTERECTOMY  11/1979  . BREAST SURGERY    . ESOPHAGOGASTRODUODENOSCOPY N/A 08/26/2018   Procedure: ESOPHAGOGASTRODUODENOSCOPY (EGD);  Surgeon: Doran Stabler, MD;  Location: [facility];  Service: Gastroenterology;  Laterality: N/A;  . Left wrist  1963  . MASTECTOMY  11/1979   Bilaterally & implants  . OTHER SURGICAL HISTORY Bilateral    removal of breast implants  . TONSILLECTOMY  1955  . TUBAL LIGATION  1975    reports that she has never smoked. She has never used smokeless tobacco. She reports current alcohol use. She reports that she does not use drugs. family history includes Breast cancer in her mother; Colon polyps (age of onset: 67) in her sister; Coronary artery disease in her mother; Coronary artery  disease (age of onset: 39) in her father; Endometriosis in her sister; Heart disease in her brother; Hyperlipidemia in her brother; Hypertension in her brother; Irritable bowel syndrome in her sister; Uterine cancer in her mother. Allergies  Allergen Reactions  . Albuterol Anaphylaxis    Pt states she has tolerated levalbuterol  . Albuterol Sulfate Anaphylaxis    Pt states she has tolerated levalbuterol  . Amoxicillin-Pot Clavulanate Anaphylaxis    Lips swell   . Broccoli [Brassica Oleracea] Anaphylaxis  . Covid-19 (Mrna) Vaccine Therapist, music) [Covid-19 (Mrna) Vaccine] Anaphylaxis  . Drug Ingredient [Acetylcysteine] Shortness Of Breath  . Epipen [Epinephrine] Other (See Comments)    Allergic to preservatives in Epipen with caution!!!  . Fluzone Anaphylaxis  . Influenza Vaccines Anaphylaxis  . Morphine Anaphylaxis  . Penicillin G Anaphylaxis    All cillins All cillins  . Povidone-Iodine Anaphylaxis and Hives  . Sulfonamide Derivatives Anaphylaxis  . Atrovent [Ipratropium] Other (See Comments)    Has sulfa in preservative  . Ciprofloxacin Itching    Itchy palms  . Hyoscyamine Sulfate Hives  . Latex Itching  . Meperidine Hcl Nausea And Vomiting    REACTION: N\T\V  . Other Other (See Comments) and Swelling    Mucomist-unable to breathe Arm swelling with red streaks  .  Pneumococcal Vaccines Other (See Comments)    Pt declines  . Ppd [Tuberculin Purified Protein Derivative] Swelling and Other (See Comments)    Arm swelling with red streaks  . Reglan [Metoclopramide] Other (See Comments)  . Zostavax [Zoster Vaccine Live] Other (See Comments)    Pt declines  . Pepcid [Famotidine] Nausea And Vomiting, Palpitations and Other (See Comments)    Due to sulfide preservative   Current Outpatient Medications on File Prior to Visit  Medication Sig Dispense Refill  . Ascorbic Acid (VITAMIN C) 1000 MG tablet Take 1,000 mg by mouth daily.    Marland Kitchen aspirin-acetaminophen-caffeine (EXCEDRIN MIGRAINE)  250-250-65 MG per tablet Take 1 tablet by mouth every 6 (six) hours as needed. For migraine    . azithromycin (ZITHROMAX) 250 MG tablet 2 tab by mouth day 1, then 1 per day 6 tablet 0  . benzonatate (TESSALON PERLES) 100 MG capsule Take 1 capsule (100 mg total) by mouth 2 (two) times daily as needed for cough. 40 capsule 1  . Cyanocobalamin (VITAMIN B 12 PO) Take 1 tablet by mouth daily.    . cyclobenzaprine (FLEXERIL) 10 MG tablet Take 1 tablet (10 mg total) by mouth 3 (three) times daily. Annual appt due in May must see provider for future refills (Patient taking differently: Take 5-10 mg by mouth 3 (three) times daily as needed for muscle spasms.) 60 tablet 2  . EPINEPHrine 0.3 mg/0.3 mL IJ SOAJ injection As directed for severe allergic reaction (Patient taking differently: Inject 0.3 mg into the muscle once as needed for anaphylaxis.) 2 each 12  . levalbuterol (XOPENEX HFA) 45 MCG/ACT inhaler Inhale 1-2 puffs into the lungs every 6 (six) hours as needed. For shortness of breath 15 g 12  . levalbuterol (XOPENEX) 1.25 MG/3ML nebulizer solution Take 1.25 mg by nebulization every 4 (four) hours as needed for wheezing. 72 mL 12  . Melatonin 10 MG CAPS Take 10 mg by mouth at bedtime as needed (sleep).     . methylPREDNISolone (MEDROL DOSEPAK) 4 MG TBPK tablet 4 tab by mouth x 3days,2tab x 3 days,1 tab x 3days 21 tablet 0  . mometasone-formoterol (DULERA) 200-5 MCG/ACT AERO Inhale 2 puffs into the lungs 2 (two) times daily. Rinse mouth 1 Inhaler 2  . pantoprazole (PROTONIX) 40 MG tablet Take 1 tablet (40 mg total) by mouth daily. Annual appt due in May must see provider for future refills 90 tablet 3  . vitamin E 400 UNIT capsule Take 400 Units by mouth daily.    Marland Kitchen zolpidem (AMBIEN) 10 MG tablet TAKE 1 TABLET BY MOUTH AT BEDTIME 90 tablet 1  . diphenhydrAMINE (BENADRYL) 25 MG tablet Take 1 tablet (25 mg total) by mouth every 6 (six) hours as needed for up to 5 days. 20 tablet 0   No current  facility-administered medications on file prior to visit.        ROS:  All others reviewed and negative.  Objective        PE:  BP (!) 160/96 (BP Location: Left Arm, Patient Position: Sitting, Cuff Size: Large)   Pulse (!) 101   Temp 97.8 F (36.6 C) (Oral)   Ht 5\' 3"  (1.6 m)   Wt 163 lb 6.4 oz (74.1 kg)   SpO2 98%   BMI 28.95 kg/m                 Constitutional: Pt appears in NAD, mild ill  HENT: Head: NCAT.                Right Ear: External ear normal.                 Left Ear: External ear normal.                Eyes: . Pupils are equal, round, and reactive to light. Conjunctivae and EOM are normal, Bilat tm's with mild erythema.  Max sinus areas mild tender.  Pharynx with mild erythema, no exudate               Nose: without d/c or deformity               Neck: Neck supple. Gross normal ROM               Cardiovascular: Normal rate and regular rhythm.                 Pulmonary/Chest: Effort normal and breath sounds decreased without rales but with bilat mild wheezing.                Abd:  Soft, NT, ND, + BS, no organomegaly               Neurological: Pt is alert. At baseline orientation, motor grossly intact               Skin: Skin is warm. No rashes, no other new lesions, LE edema - none               Psychiatric: Pt behavior is normal without agitation   Micro: none  Cardiac tracings I have personally interpreted today:  none  Pertinent Radiological findings (summarize): none   Lab Results  Component Value Date   WBC 9.3 03/13/2020   HGB 13.8 03/13/2020   HCT 41.2 03/13/2020   PLT 530.0 (H) 03/13/2020   GLUCOSE 102 (H) 08/14/2020   CHOL 215 (H) 08/14/2020   TRIG 344.0 (H) 08/14/2020   HDL 53.10 08/14/2020   LDLDIRECT 62.0 08/14/2020   LDLCALC 133 (H) 02/03/2017   ALT 17 08/14/2020   AST 19 08/14/2020   NA 140 08/14/2020   K 3.7 08/14/2020   CL 104 08/14/2020   CREATININE 1.10 08/14/2020   BUN 13 08/14/2020   CO2 26 08/14/2020   TSH 5.13  (H) 03/13/2020   INR 1.0 08/24/2018   HGBA1C 6.2 08/14/2020   Assessment/Plan:  [name] [name] is a 70 y.o. White or Caucasian [1] female with  has a past medical history of ASTHMA, BREAST CANCER, HX OF (11/1979), CKD (chronic kidney disease) stage 3, GFR 30-59 ml/min (HCC) (03/05/2020), GERD, GI bleed (11/92, 11/93, 11/94), Irritable bowel syndrome, MIGRAINE HEADACHE, and Symptomatic anemia (08/24/2018).  Asthmatic bronchitis Mild to mod, for cxr, antibx course, cough med prn, depomedrol 80 IM,  to f/u any worsening symptoms or concerns  CKD (chronic kidney disease) stage 3, GFR 30-59 ml/min Lab Results  Component Value Date   CREATININE 1.10 08/14/2020   Stable overall, cont to avoid nephrotoxins  Hyperglycemia Lab Results  Component Value Date   HGBA1C 6.2 08/14/2020   Stable, pt to continue current medical treatment  - diet   Blood pressure elevated without history of HTN Likely situational, continue to monitor at home and next visit  Followup: Return if symptoms worsen or fail to improve.  Cathlean Cower, MD 01/05/2021 8:08 PM [facility]

## 2020-12-31 NOTE — Patient Instructions (Signed)
Please take all new medication as prescribed - the cough medicine, and the levaquin  You had the steroid shot today  Please continue all other medications as before, and refills have been done if requested.  Please have the pharmacy call with any other refills you may need.  Please keep your appointments with your specialists as you may have planned  Please go to the XRAY Department in the first floor for the x-ray testing  You will be contacted by phone if any changes need to be made immediately.  Otherwise, you will receive a letter about your results with an explanation, but please check with MyChart first.  Please remember to sign up for MyChart if you have not done so, as this will be important to you in the future with finding out test results, communicating by private email, and scheduling acute appointments online when needed.

## 2021-01-02 ENCOUNTER — Encounter: Payer: Self-pay | Admitting: Internal Medicine

## 2021-01-05 ENCOUNTER — Encounter: Payer: Self-pay | Admitting: Internal Medicine

## 2021-01-05 DIAGNOSIS — R030 Elevated blood-pressure reading, without diagnosis of hypertension: Secondary | ICD-10-CM | POA: Insufficient documentation

## 2021-01-05 NOTE — Assessment & Plan Note (Signed)
Lab Results  Component Value Date   CREATININE 1.10 08/14/2020   Stable overall, cont to avoid nephrotoxins

## 2021-01-05 NOTE — Assessment & Plan Note (Addendum)
Mild to mod, for cxr, antibx course, cough med prn, depomedrol 80 IM,  to f/u any worsening symptoms or concerns

## 2021-01-05 NOTE — Assessment & Plan Note (Signed)
Lab Results  Component Value Date   HGBA1C 6.2 08/14/2020   Stable, pt to continue current medical treatment  - diet  

## 2021-01-05 NOTE — Assessment & Plan Note (Signed)
Likely situational, continue to monitor at home and next visit

## 2021-01-14 ENCOUNTER — Other Ambulatory Visit: Payer: Self-pay | Admitting: Internal Medicine

## 2021-03-04 DIAGNOSIS — L986 Other infiltrative disorders of the skin and subcutaneous tissue: Secondary | ICD-10-CM | POA: Diagnosis not present

## 2021-03-04 DIAGNOSIS — D75839 Thrombocytosis, unspecified: Secondary | ICD-10-CM | POA: Diagnosis not present

## 2021-03-06 ENCOUNTER — Other Ambulatory Visit: Payer: Self-pay

## 2021-03-06 ENCOUNTER — Ambulatory Visit (INDEPENDENT_AMBULATORY_CARE_PROVIDER_SITE_OTHER): Payer: Medicare Other | Admitting: Internal Medicine

## 2021-03-06 VITALS — BP 122/74 | HR 96 | Temp 97.5°F | Ht 63.0 in | Wt 158.0 lb

## 2021-03-06 DIAGNOSIS — D75839 Thrombocytosis, unspecified: Secondary | ICD-10-CM | POA: Diagnosis not present

## 2021-03-06 DIAGNOSIS — N1831 Chronic kidney disease, stage 3a: Secondary | ICD-10-CM | POA: Diagnosis not present

## 2021-03-06 DIAGNOSIS — E538 Deficiency of other specified B group vitamins: Secondary | ICD-10-CM | POA: Diagnosis not present

## 2021-03-06 DIAGNOSIS — E559 Vitamin D deficiency, unspecified: Secondary | ICD-10-CM

## 2021-03-06 DIAGNOSIS — E039 Hypothyroidism, unspecified: Secondary | ICD-10-CM

## 2021-03-06 DIAGNOSIS — Z0001 Encounter for general adult medical examination with abnormal findings: Secondary | ICD-10-CM

## 2021-03-06 DIAGNOSIS — R739 Hyperglycemia, unspecified: Secondary | ICD-10-CM

## 2021-03-06 LAB — HEPATIC FUNCTION PANEL
ALT: 14 U/L (ref 0–35)
AST: 16 U/L (ref 0–37)
Albumin: 4.4 g/dL (ref 3.5–5.2)
Alkaline Phosphatase: 65 U/L (ref 39–117)
Bilirubin, Direct: 0.1 mg/dL (ref 0.0–0.3)
Total Bilirubin: 0.4 mg/dL (ref 0.2–1.2)
Total Protein: 7.3 g/dL (ref 6.0–8.3)

## 2021-03-06 LAB — CBC WITH DIFFERENTIAL/PLATELET
Basophils Absolute: 0.1 10*3/uL (ref 0.0–0.1)
Basophils Relative: 0.8 % (ref 0.0–3.0)
Eosinophils Absolute: 0.2 10*3/uL (ref 0.0–0.7)
Eosinophils Relative: 1.7 % (ref 0.0–5.0)
HCT: 41.2 % (ref 36.0–46.0)
Hemoglobin: 13.6 g/dL (ref 12.0–15.0)
Lymphocytes Relative: 25.3 % (ref 12.0–46.0)
Lymphs Abs: 2.6 10*3/uL (ref 0.7–4.0)
MCHC: 33.1 g/dL (ref 30.0–36.0)
MCV: 84.4 fl (ref 78.0–100.0)
Monocytes Absolute: 0.5 10*3/uL (ref 0.1–1.0)
Monocytes Relative: 5.1 % (ref 3.0–12.0)
Neutro Abs: 6.9 10*3/uL (ref 1.4–7.7)
Neutrophils Relative %: 67.1 % (ref 43.0–77.0)
Platelets: 560 10*3/uL — ABNORMAL HIGH (ref 150.0–400.0)
RBC: 4.88 Mil/uL (ref 3.87–5.11)
RDW: 14.2 % (ref 11.5–15.5)
WBC: 10.3 10*3/uL (ref 4.0–10.5)

## 2021-03-06 LAB — LIPID PANEL
Cholesterol: 222 mg/dL — ABNORMAL HIGH (ref 0–200)
HDL: 61.7 mg/dL (ref >39.00)
NonHDL: 160.43
Total CHOL/HDL Ratio: 4
Triglycerides: 214 mg/dL — ABNORMAL HIGH (ref 0.0–149.0)
VLDL: 42.8 mg/dL — ABNORMAL HIGH (ref 0.0–40.0)

## 2021-03-06 LAB — HEMOGLOBIN A1C: Hgb A1c MFr Bld: 6.4 % (ref 4.6–6.5)

## 2021-03-06 LAB — BASIC METABOLIC PANEL
BUN: 18 mg/dL (ref 6–23)
CO2: 27 mEq/L (ref 19–32)
Calcium: 9.7 mg/dL (ref 8.4–10.5)
Chloride: 104 mEq/L (ref 96–112)
Creatinine, Ser: 1.1 mg/dL (ref 0.40–1.20)
GFR: 50.27 mL/min — ABNORMAL LOW (ref >60.00)
Glucose, Bld: 104 mg/dL — ABNORMAL HIGH (ref 70–99)
Potassium: 3.9 mEq/L (ref 3.5–5.1)
Sodium: 141 mEq/L (ref 135–145)

## 2021-03-06 LAB — VITAMIN D 25 HYDROXY (VIT D DEFICIENCY, FRACTURES): VITD: 61.13 ng/mL (ref 30.00–100.00)

## 2021-03-06 LAB — LDL CHOLESTEROL, DIRECT: Direct LDL: 82 mg/dL

## 2021-03-06 LAB — T4, FREE: Free T4: 1.47 ng/dL (ref 0.60–1.60)

## 2021-03-06 LAB — TSH: TSH: 4.12 u[IU]/mL (ref 0.35–4.50)

## 2021-03-06 LAB — VITAMIN B12: Vitamin B-12: >1550 pg/mL — ABNORMAL HIGH (ref 211–911)

## 2021-03-06 NOTE — Progress Notes (Signed)
Patient ID: [name] [name], female   DOB: 12-Jun-1951, 71 y.o.   MRN: [id number]         Chief Complaint:: wellness exam and ckd, elevated platelets, hyperglycemia, hypothyroidism       HPI:  [name] [name] is a 70 y.o. female here for wellness exam; declines shingrix and Tdap, o/w up to date with preventive referrals and immunizations                        Also Pt denies chest pain, increased sob or doe, wheezing, orthopnea, PND, increased LE swelling, palpitations, dizziness or syncope.   Pt denies polydipsia, polyuria, or mew focal neuro s/s.   Pt denies fever, wt loss, night sweats, loss of appetite, or other constitutional symptoms  Denies hyper or hypo thyroid symptoms such as voice, skin or hair change.  No other new complaints    Wt Readings from Last 3 Encounters:  03/06/21 158 lb (71.7 kg)  12/31/20 163 lb 6.4 oz (74.1 kg)  11/26/20 163 lb 3.2 oz (74 kg)   BP Readings from Last 3 Encounters:  03/06/21 122/74  12/31/20 (!) 160/96  11/26/20 122/88   Immunization History  Administered Date(s) Administered   Influenza Whole 09/14/2006   PFIZER(Purple Top)SARS-COV-2 Vaccination 06/04/2020   Td 09/15/2007   There are no preventive care reminders to display for this patient.     Past Medical History:  Diagnosis Date   ASTHMA    BREAST CANCER, HX OF 11/1979   CKD (chronic kidney disease) stage 3, GFR 30-59 ml/min ([facility]) 03/05/2020   GERD    GI bleed 11/92, 11/93, 11/94   GI Bleed   Irritable bowel syndrome    MIGRAINE HEADACHE    Symptomatic anemia 08/24/2018   Past Surgical History:  Procedure Laterality Date   ABDOMINAL HYSTERECTOMY  11/1979   BREAST SURGERY     ESOPHAGOGASTRODUODENOSCOPY N/A 08/26/2018   Procedure: ESOPHAGOGASTRODUODENOSCOPY (EGD);  Surgeon: Doran Stabler, MD;  Location: [facility];  Service: Gastroenterology;  Laterality: N/A;   Left wrist  1963   MASTECTOMY  11/1979   Bilaterally & implants   OTHER SURGICAL HISTORY Bilateral    removal of  breast implants   [place]    reports that she has never smoked. She has never used smokeless tobacco. She reports current alcohol use. She reports that she does not use drugs. family history includes Breast cancer in her mother; Colon polyps (age of onset: 30) in her sister; Coronary artery disease in her mother; Coronary artery disease (age of onset: 25) in her father; Endometriosis in her sister; Heart disease in her brother; Hyperlipidemia in her brother; Hypertension in her brother; Irritable bowel syndrome in her sister; Uterine cancer in her mother. Allergies  Allergen Reactions   Albuterol Anaphylaxis    Pt states she has tolerated levalbuterol   Albuterol Sulfate Anaphylaxis    Pt states she has tolerated levalbuterol   Amoxicillin-Pot Clavulanate Anaphylaxis    Lips swell    Broccoli [Brassica Oleracea] Anaphylaxis   Covid-19 (Mrna) Vaccine Therapist, music) [Covid-19 (Mrna) Vaccine] Anaphylaxis   Drug Ingredient [Acetylcysteine] Shortness Of Breath   Epipen [Epinephrine] Other (See Comments)    Allergic to preservatives in Epipen with caution!!!   Fluzone Anaphylaxis   Influenza Vaccines Anaphylaxis   Morphine Anaphylaxis   Penicillin G Anaphylaxis    All cillins All cillins   Povidone-Iodine Anaphylaxis and Hives   Sulfonamide  Derivatives Anaphylaxis   Atrovent [Ipratropium] Other (See Comments)    Has sulfa in preservative   Ciprofloxacin Itching    Itchy palms   Hyoscyamine Sulfate Hives   Latex Itching   Meperidine Hcl Nausea And Vomiting    REACTION: N\T\V   Other Other (See Comments) and Swelling    Mucomist-unable to breathe Arm swelling with red streaks   Pneumococcal Vaccines Other (See Comments)    Pt declines   Ppd [Tuberculin Purified Protein Derivative] Swelling and Other (See Comments)    Arm swelling with red streaks   Reglan [Metoclopramide] Other (See Comments)   Zostavax [Zoster Vaccine Live] Other (See Comments)    Pt  declines   Pepcid [Famotidine] Nausea And Vomiting, Palpitations and Other (See Comments)    Due to sulfide preservative   Current Outpatient Medications on File Prior to Visit  Medication Sig Dispense Refill   Ascorbic Acid (VITAMIN C) 1000 MG tablet Take 1,000 mg by mouth daily.     aspirin-acetaminophen-caffeine (EXCEDRIN MIGRAINE) 250-250-65 MG per tablet Take 1 tablet by mouth every 6 (six) hours as needed. For migraine     Cyanocobalamin (VITAMIN B 12 PO) Take 1 tablet by mouth daily.     cyclobenzaprine (FLEXERIL) 10 MG tablet Take 1 tablet (10 mg total) by mouth 3 (three) times daily. Annual appt due in May must see provider for future refills (Patient taking differently: Take 5-10 mg by mouth 3 (three) times daily as needed for muscle spasms.) 60 tablet 2   EPINEPHrine 0.3 mg/0.3 mL IJ SOAJ injection As directed for severe allergic reaction (Patient taking differently: Inject 0.3 mg into the muscle once as needed for anaphylaxis.) 2 each 12   levalbuterol (XOPENEX HFA) 45 MCG/ACT inhaler Inhale 1-2 puffs into the lungs every 6 (six) hours as needed. For shortness of breath 15 g 12   levalbuterol (XOPENEX) 1.25 MG/3ML nebulizer solution Take 1.25 mg by nebulization every 4 (four) hours as needed for wheezing. 72 mL 12   Melatonin 10 MG CAPS Take 10 mg by mouth at bedtime as needed (sleep).      mometasone-formoterol (DULERA) 200-5 MCG/ACT AERO Inhale 2 puffs into the lungs 2 (two) times daily. Rinse mouth 1 Inhaler 2   pantoprazole (PROTONIX) 40 MG tablet TAKE 1 TABLET BY MOUTH DAILY 90 tablet 3   vitamin E 400 UNIT capsule Take 400 Units by mouth daily.     zolpidem (AMBIEN) 10 MG tablet TAKE 1 TABLET BY MOUTH AT BEDTIME 90 tablet 1   azithromycin (ZITHROMAX) 250 MG tablet 2 tab by mouth day 1, then 1 per day (Patient not taking: Reported on 03/06/2021) 6 tablet 0   benzonatate (TESSALON PERLES) 100 MG capsule Take 1 capsule (100 mg total) by mouth 2 (two) times daily as needed for cough.  (Patient not taking: Reported on 03/06/2021) 40 capsule 1   diphenhydrAMINE (BENADRYL) 25 MG tablet Take 1 tablet (25 mg total) by mouth every 6 (six) hours as needed for up to 5 days. 20 tablet 0   methylPREDNISolone (MEDROL DOSEPAK) 4 MG TBPK tablet 4 tab by mouth x 3days,2tab x 3 days,1 tab x 3days (Patient not taking: Reported on 03/06/2021) 21 tablet 0   triamcinolone ointment (KENALOG) 0.1 % Apply topically.     No current facility-administered medications on file prior to visit.        ROS:  All others reviewed and negative.  Objective        PE:  BP 122/74 (BP  Location: Left Arm, Patient Position: Sitting, Cuff Size: Normal)   Pulse 96   Temp (!) 97.5 F (36.4 C) (Oral)   Ht 5\' 3"  (1.6 m)   Wt 158 lb (71.7 kg)   SpO2 95%   BMI 27.99 kg/m                 Constitutional: Pt appears in NAD               HENT: Head: NCAT.                Right Ear: External ear normal.                 Left Ear: External ear normal.                Eyes: . Pupils are equal, round, and reactive to light. Conjunctivae and EOM are normal               Nose: without d/c or deformity               Neck: Neck supple. Gross normal ROM               Cardiovascular: Normal rate and regular rhythm.                 Pulmonary/Chest: Effort normal and breath sounds without rales or wheezing.                Abd:  Soft, NT, ND, + BS, no organomegaly               Neurological: Pt is alert. At baseline orientation, motor grossly intact               Skin: Skin is warm. No rashes, no other new lesions, LE edema - none               Psychiatric: Pt behavior is normal without agitation   Micro: none  Cardiac tracings I have personally interpreted today:  none  Pertinent Radiological findings (summarize): none   Lab Results  Component Value Date   WBC 10.3 03/06/2021   HGB 13.6 03/06/2021   HCT 41.2 03/06/2021   PLT 560.0 (H) 03/06/2021   GLUCOSE 104 (H) 03/06/2021   CHOL 222 (H) 03/06/2021   TRIG 214.0  (H) 03/06/2021   HDL 61.70 03/06/2021   LDLDIRECT 82.0 03/06/2021   LDLCALC 133 (H) 02/03/2017   ALT 14 03/06/2021   AST 16 03/06/2021   NA 141 03/06/2021   K 3.9 03/06/2021   CL 104 03/06/2021   CREATININE 1.10 03/06/2021   BUN 18 03/06/2021   CO2 27 03/06/2021   TSH 4.12 03/06/2021   INR 1.0 08/24/2018   HGBA1C 6.4 03/06/2021   Assessment/Plan:  ENAYA HOWZE is a 70 y.o. White or Caucasian [1] female with  has a past medical history of ASTHMA, BREAST CANCER, HX OF (11/1979), CKD (chronic kidney disease) stage 3, GFR 30-59 ml/min (HCC) (03/05/2020), GERD, GI bleed (11/92, 11/93, 11/94), Irritable bowel syndrome, MIGRAINE HEADACHE, and Symptomatic anemia (08/24/2018).  CKD (chronic kidney disease) stage 3, GFR 30-59 ml/min Lab Results  Component Value Date   CREATININE 1.10 03/06/2021   Stable overall, cont to avoid nephrotoxins   Hyperglycemia Lab Results  Component Value Date   HGBA1C 6.4 03/06/2021   Stable, pt to continue current medical treatment  - diet   Hypothyroidism Lab Results  Component Value Date   TSH 4.12 03/06/2021  Stable, pt to continue levothyroxine   Thrombocytosis (HCC) Chronic persistent stable, uncomplicated, declines heme f/u,  to f/u any worsening symptoms or concerns Lab Results  Component Value Date   PLT 560.0 (H) 03/06/2021   Followup: Return in about 6 months (around 09/05/2021).  Cathlean Cower, MD 03/08/2021 9:25 PM [facility]

## 2021-03-06 NOTE — Patient Instructions (Signed)

## 2021-03-07 ENCOUNTER — Encounter: Payer: Self-pay | Admitting: Internal Medicine

## 2021-03-08 ENCOUNTER — Encounter: Payer: Self-pay | Admitting: Internal Medicine

## 2021-03-08 NOTE — Assessment & Plan Note (Signed)
Chronic persistent stable, uncomplicated, declines heme f/u,  to f/u any worsening symptoms or concerns Lab Results  Component Value Date   PLT 560.0 (H) 03/06/2021

## 2021-03-08 NOTE — Assessment & Plan Note (Signed)
Lab Results  Component Value Date   HGBA1C 6.4 03/06/2021   Stable, pt to continue current medical treatment  - diet

## 2021-03-08 NOTE — Assessment & Plan Note (Signed)
Lab Results  Component Value Date   CREATININE 1.10 03/06/2021   Stable overall, cont to avoid nephrotoxins

## 2021-03-08 NOTE — Assessment & Plan Note (Signed)
Lab Results  Component Value Date   TSH 4.12 03/06/2021   Stable, pt to continue levothyroxine

## 2021-06-04 DIAGNOSIS — H40013 Open angle with borderline findings, low risk, bilateral: Secondary | ICD-10-CM | POA: Diagnosis not present

## 2021-06-04 DIAGNOSIS — H02831 Dermatochalasis of right upper eyelid: Secondary | ICD-10-CM | POA: Diagnosis not present

## 2021-06-04 DIAGNOSIS — H2513 Age-related nuclear cataract, bilateral: Secondary | ICD-10-CM | POA: Diagnosis not present

## 2021-08-27 DIAGNOSIS — C50912 Malignant neoplasm of unspecified site of left female breast: Secondary | ICD-10-CM | POA: Diagnosis not present

## 2021-09-16 ENCOUNTER — Ambulatory Visit: Payer: Medicare Other | Admitting: Internal Medicine

## 2021-09-17 ENCOUNTER — Encounter: Payer: Self-pay | Admitting: Internal Medicine

## 2021-09-17 ENCOUNTER — Ambulatory Visit (INDEPENDENT_AMBULATORY_CARE_PROVIDER_SITE_OTHER): Payer: Medicare Other | Admitting: Internal Medicine

## 2021-09-17 ENCOUNTER — Other Ambulatory Visit: Payer: Self-pay

## 2021-09-17 VITALS — BP 122/90 | HR 94 | Resp 18 | Ht 63.0 in | Wt 157.0 lb

## 2021-09-17 DIAGNOSIS — E538 Deficiency of other specified B group vitamins: Secondary | ICD-10-CM | POA: Diagnosis not present

## 2021-09-17 DIAGNOSIS — R030 Elevated blood-pressure reading, without diagnosis of hypertension: Secondary | ICD-10-CM

## 2021-09-17 DIAGNOSIS — Z0000 Encounter for general adult medical examination without abnormal findings: Secondary | ICD-10-CM

## 2021-09-17 DIAGNOSIS — J45909 Unspecified asthma, uncomplicated: Secondary | ICD-10-CM | POA: Diagnosis not present

## 2021-09-17 DIAGNOSIS — N1831 Chronic kidney disease, stage 3a: Secondary | ICD-10-CM

## 2021-09-17 DIAGNOSIS — R739 Hyperglycemia, unspecified: Secondary | ICD-10-CM

## 2021-09-17 DIAGNOSIS — D75839 Thrombocytosis, unspecified: Secondary | ICD-10-CM

## 2021-09-17 DIAGNOSIS — E039 Hypothyroidism, unspecified: Secondary | ICD-10-CM

## 2021-09-17 DIAGNOSIS — Z0001 Encounter for general adult medical examination with abnormal findings: Secondary | ICD-10-CM

## 2021-09-17 DIAGNOSIS — E559 Vitamin D deficiency, unspecified: Secondary | ICD-10-CM | POA: Diagnosis not present

## 2021-09-17 LAB — BASIC METABOLIC PANEL
BUN: 15 mg/dL (ref 6–23)
CO2: 29 mEq/L (ref 19–32)
Calcium: 9.7 mg/dL (ref 8.4–10.5)
Chloride: 102 mEq/L (ref 96–112)
Creatinine, Ser: 1.13 mg/dL (ref 0.40–1.20)
GFR: 48.49 mL/min — ABNORMAL LOW (ref >60.00)
Glucose, Bld: 91 mg/dL (ref 70–99)
Potassium: 3.9 mEq/L (ref 3.5–5.1)
Sodium: 139 mEq/L (ref 135–145)

## 2021-09-17 LAB — TSH: TSH: 4.74 u[IU]/mL (ref 0.35–5.50)

## 2021-09-17 LAB — CBC WITH DIFFERENTIAL/PLATELET
Basophils Absolute: 0.1 10*3/uL (ref 0.0–0.1)
Basophils Relative: 1.4 % (ref 0.0–3.0)
Eosinophils Absolute: 0.3 10*3/uL (ref 0.0–0.7)
Eosinophils Relative: 4.8 % (ref 0.0–5.0)
HCT: 41.6 % (ref 36.0–46.0)
Hemoglobin: 13.6 g/dL (ref 12.0–15.0)
Lymphocytes Relative: 34.2 % (ref 12.0–46.0)
Lymphs Abs: 2.2 10*3/uL (ref 0.7–4.0)
MCHC: 32.6 g/dL (ref 30.0–36.0)
MCV: 85.8 fl (ref 78.0–100.0)
Monocytes Absolute: 0.6 10*3/uL (ref 0.1–1.0)
Monocytes Relative: 8.8 % (ref 3.0–12.0)
Neutro Abs: 3.2 10*3/uL (ref 1.4–7.7)
Neutrophils Relative %: 50.8 % (ref 43.0–77.0)
Platelets: 534 10*3/uL — ABNORMAL HIGH (ref 150.0–400.0)
RBC: 4.85 Mil/uL (ref 3.87–5.11)
RDW: 15.4 % (ref 11.5–15.5)
WBC: 6.4 10*3/uL (ref 4.0–10.5)

## 2021-09-17 LAB — HEPATIC FUNCTION PANEL
ALT: 20 U/L (ref 0–35)
AST: 22 U/L (ref 0–37)
Albumin: 4.5 g/dL (ref 3.5–5.2)
Alkaline Phosphatase: 64 U/L (ref 39–117)
Bilirubin, Direct: 0.1 mg/dL (ref 0.0–0.3)
Total Bilirubin: 0.4 mg/dL (ref 0.2–1.2)
Total Protein: 7.4 g/dL (ref 6.0–8.3)

## 2021-09-17 LAB — VITAMIN B12: Vitamin B-12: >1550 pg/mL — ABNORMAL HIGH (ref 211–911)

## 2021-09-17 LAB — LIPID PANEL
Cholesterol: 220 mg/dL — ABNORMAL HIGH (ref 0–200)
HDL: 59.5 mg/dL (ref >39.00)
NonHDL: 160.84
Total CHOL/HDL Ratio: 4
Triglycerides: 285 mg/dL — ABNORMAL HIGH (ref 0.0–149.0)
VLDL: 57 mg/dL — ABNORMAL HIGH (ref 0.0–40.0)

## 2021-09-17 LAB — HEMOGLOBIN A1C: Hgb A1c MFr Bld: 6.4 % (ref 4.6–6.5)

## 2021-09-17 LAB — VITAMIN D 25 HYDROXY (VIT D DEFICIENCY, FRACTURES): VITD: 48.54 ng/mL (ref 30.00–100.00)

## 2021-09-17 LAB — LDL CHOLESTEROL, DIRECT: Direct LDL: 88 mg/dL

## 2021-09-17 NOTE — Patient Instructions (Signed)
Please continue all other medications as before, and refills have been done if requested.  Please have the pharmacy call with any other refills you may need.  Please continue your efforts at being more active, low cholesterol diet, and weight control.  You are otherwise up to date with prevention measures today.  Please keep your appointments with your specialists as you may have planned  Please call if you change your mind about the Cardiac CT score testing to check for artery blockages  Please go to the LAB at the blood drawing area for the tests to be done  You will be contacted by phone if any changes need to be made immediately.  Otherwise, you will receive a letter about your results with an explanation, but please check with MyChart first.  Please remember to sign up for MyChart if you have not done so, as this will be important to you in the future with finding out test results, communicating by private email, and scheduling acute appointments online when needed.  Please make an Appointment to return in 6 months, or sooner if needed, also with Lab Appointment for testing done 3-5 days before at the [facility] (so this is for TWO appointments - please see the scheduling desk as you leave)  Due to the ongoing Covid 19 pandemic, our lab now requires an appointment for any labs done at our office.  If you need labs done and do not have an appointment, please call our office ahead of time to schedule before presenting to the lab for your testing.

## 2021-09-17 NOTE — Progress Notes (Signed)
Patient ID: [name] [name], female   DOB: 11/17/1950, 71 y.o.   MRN: [id number]         Chief Complaint:: wellness exam and elevated BP, ckd, hyperglycemia, asthma, chronic elevated platelets       HPI:  [name] [name] is a 71 y.o. female here for wellness exam; decliens shingrix, tdap , pneumovax o/w up to date                        Also Pt denies chest pain, increased sob or doe, wheezing, orthopnea, PND, increased LE swelling, palpitations, dizziness or syncope.   Pt denies polydipsia, polyuria, or new focal neuro s/s.   Pt denies fever, wt loss, night sweats, loss of appetite, or other constitutional symptoms  No overt bleeding or bruising.  BP < 140/0 at home - declines change in tx today.  No other new complaints   Denies hyper or hypo thyroid symptoms such as voice, skin or hair change   Wt Readings from Last 3 Encounters:  09/17/21 157 lb (71.2 kg)  03/06/21 158 lb (71.7 kg)  12/31/20 163 lb 6.4 oz (74.1 kg)   BP Readings from Last 3 Encounters:  09/17/21 122/90  03/06/21 122/74  12/31/20 (!) 160/96   Immunization History  Administered Date(s) Administered   Influenza Whole 09/14/2006   PFIZER(Purple Top)SARS-COV-2 Vaccination 06/04/2020   Td 09/15/2007   There are no preventive care reminders to display for this patient.     Past Medical History:  Diagnosis Date   ASTHMA    BREAST CANCER, HX OF 11/1979   CKD (chronic kidney disease) stage 3, GFR 30-59 ml/min ([place]) 03/05/2020   GERD    GI bleed 11/92, 11/93, 11/94   GI Bleed   Irritable bowel syndrome    MIGRAINE HEADACHE    Symptomatic anemia 08/24/2018   Past Surgical History:  Procedure Laterality Date   ABDOMINAL HYSTERECTOMY  11/1979   BREAST SURGERY     ESOPHAGOGASTRODUODENOSCOPY N/A 08/26/2018   Procedure: ESOPHAGOGASTRODUODENOSCOPY (EGD);  Surgeon: Doran Stabler, MD;  Location: [facility];  Service: Gastroenterology;  Laterality: N/A;   Left wrist  1963   MASTECTOMY  11/1979   Bilaterally &  implants   OTHER SURGICAL HISTORY Bilateral    removal of breast implants   [place]    reports that she has never smoked. She has never used smokeless tobacco. She reports current alcohol use. She reports that she does not use drugs. family history includes Breast cancer in her mother; Colon polyps (age of onset: 38) in her sister; Coronary artery disease in her mother; Coronary artery disease (age of onset: 49) in her father; Endometriosis in her sister; Heart disease in her brother; Hyperlipidemia in her brother; Hypertension in her brother; Irritable bowel syndrome in her sister; Uterine cancer in her mother. Allergies  Allergen Reactions   Albuterol Anaphylaxis    Pt states she has tolerated levalbuterol   Albuterol Sulfate Anaphylaxis    Pt states she has tolerated levalbuterol   Amoxicillin-Pot Clavulanate Anaphylaxis    Lips swell    Broccoli [Brassica Oleracea] Anaphylaxis   Covid-19 (Mrna) Vaccine Therapist, music) [Covid-19 (Mrna) Vaccine] Anaphylaxis   Drug Ingredient [Acetylcysteine] Shortness Of Breath   Epipen [Epinephrine] Other (See Comments)    Allergic to preservatives in Epipen with caution!!!   Fluzone Anaphylaxis   Influenza Vaccines Anaphylaxis   Morphine Anaphylaxis   Penicillin G Anaphylaxis  All cillins All cillins   Povidone-Iodine Anaphylaxis and Hives   Sulfonamide Derivatives Anaphylaxis   Atrovent [Ipratropium] Other (See Comments)    Has sulfa in preservative   Ciprofloxacin Itching    Itchy palms   Hyoscyamine Sulfate Hives   Latex Itching   Meperidine Hcl Nausea And Vomiting    REACTION: N\T\V   Other Other (See Comments) and Swelling    Mucomist-unable to breathe Arm swelling with red streaks   Pneumococcal Vaccines Other (See Comments)    Pt declines   Ppd [Tuberculin Purified Protein Derivative] Swelling and Other (See Comments)    Arm swelling with red streaks   Reglan [Metoclopramide] Other (See Comments)    Zostavax [Zoster Vaccine Live] Other (See Comments)    Pt declines   Pepcid [Famotidine] Nausea And Vomiting, Palpitations and Other (See Comments)    Due to sulfide preservative   Current Outpatient Medications on File Prior to Visit  Medication Sig Dispense Refill   Ascorbic Acid (VITAMIN C) 1000 MG tablet Take 1,000 mg by mouth daily.     aspirin-acetaminophen-caffeine (EXCEDRIN MIGRAINE) 250-250-65 MG per tablet Take 1 tablet by mouth every 6 (six) hours as needed. For migraine     Cyanocobalamin (VITAMIN B 12 PO) Take 1 tablet by mouth daily.     cyclobenzaprine (FLEXERIL) 10 MG tablet Take 1 tablet (10 mg total) by mouth 3 (three) times daily. Annual appt due in May must see provider for future refills (Patient taking differently: Take 5-10 mg by mouth 3 (three) times daily as needed for muscle spasms.) 60 tablet 2   EPINEPHrine 0.3 mg/0.3 mL IJ SOAJ injection As directed for severe allergic reaction (Patient taking differently: Inject 0.3 mg into the muscle once as needed for anaphylaxis.) 2 each 12   levalbuterol (XOPENEX HFA) 45 MCG/ACT inhaler Inhale 1-2 puffs into the lungs every 6 (six) hours as needed. For shortness of breath 15 g 12   levalbuterol (XOPENEX) 1.25 MG/3ML nebulizer solution Take 1.25 mg by nebulization every 4 (four) hours as needed for wheezing. 72 mL 12   Melatonin 10 MG CAPS Take 10 mg by mouth at bedtime as needed (sleep).      mometasone-formoterol (DULERA) 200-5 MCG/ACT AERO Inhale 2 puffs into the lungs 2 (two) times daily. Rinse mouth 1 Inhaler 2   pantoprazole (PROTONIX) 40 MG tablet TAKE 1 TABLET BY MOUTH DAILY 90 tablet 3   triamcinolone ointment (KENALOG) 0.1 % Apply topically.     vitamin E 400 UNIT capsule Take 400 Units by mouth daily.     zolpidem (AMBIEN) 10 MG tablet TAKE 1 TABLET BY MOUTH AT BEDTIME 90 tablet 1   diphenhydrAMINE (BENADRYL) 25 MG tablet Take 1 tablet (25 mg total) by mouth every 6 (six) hours as needed for up to 5 days. 20 tablet 0    No current facility-administered medications on file prior to visit.        ROS:  All others reviewed and negative.  Objective        PE:  BP 122/90    Pulse 94    Resp 18    Ht 5\' 3"  (1.6 m)    Wt 157 lb (71.2 kg)    SpO2 98%    BMI 27.81 kg/m                 Constitutional: Pt appears in NAD               HENT: Head: NCAT.  Right Ear: External ear normal.                 Left Ear: External ear normal.                Eyes: . Pupils are equal, round, and reactive to light. Conjunctivae and EOM are normal               Nose: without d/c or deformity               Neck: Neck supple. Gross normal ROM               Cardiovascular: Normal rate and regular rhythm.                 Pulmonary/Chest: Effort normal and breath sounds without rales or wheezing.                Abd:  Soft, NT, ND, + BS, no organomegaly               Neurological: Pt is alert. At baseline orientation, motor grossly intact               Skin: Skin is warm. No rashes, no other new lesions, LE edema - none               Psychiatric: Pt behavior is normal without agitation   Micro: none  Cardiac tracings I have personally interpreted today:  none  Pertinent Radiological findings (summarize): none   Lab Results  Component Value Date   WBC 6.4 09/17/2021   HGB 13.6 09/17/2021   HCT 41.6 09/17/2021   PLT 534.0 (H) 09/17/2021   GLUCOSE 91 09/17/2021   CHOL 220 (H) 09/17/2021   TRIG 285.0 (H) 09/17/2021   HDL 59.50 09/17/2021   LDLDIRECT 88.0 09/17/2021   LDLCALC 133 (H) 02/03/2017   ALT 20 09/17/2021   AST 22 09/17/2021   NA 139 09/17/2021   K 3.9 09/17/2021   CL 102 09/17/2021   CREATININE 1.13 09/17/2021   BUN 15 09/17/2021   CO2 29 09/17/2021   TSH 4.74 09/17/2021   INR 1.0 08/24/2018   HGBA1C 6.4 09/17/2021   Assessment/Plan:  LAKESA COSTE is a 71 y.o. White or Caucasian [1] female with  has a past medical history of ASTHMA, BREAST CANCER, HX OF (11/1979), CKD (chronic kidney  disease) stage 3, GFR 30-59 ml/min ([facility]) (03/05/2020), GERD, GI bleed (11/92, 11/93, 11/94), Irritable bowel syndrome, MIGRAINE HEADACHE, and Symptomatic anemia (08/24/2018).  Encounter for well adult exam with abnormal findings Age and sex appropriate education and counseling updated with regular exercise and diet Referrals for preventative services - none needed Immunizations addressed - declines shingirx, tdap, pneumovax Smoking counseling  - none needed Evidence for depression or other mood disorder - none significant Most recent labs reviewed. I have personally reviewed and have noted: 1) the patient's medical and social history 2) The patient's current medications and supplements 3) The patient's height, weight, and BMI have been recorded in the chart   Blood pressure elevated without history of HTN BP Readings from Last 3 Encounters:  09/17/21 122/90  03/06/21 122/74  12/31/20 (!) 160/96   Stable, pt to continue medical treatment  - diet, wt control, low salt   CKD (chronic kidney disease) stage 3, GFR 30-59 ml/min Lab Results  Component Value Date   CREATININE 1.13 09/17/2021   Stable overall, cont to avoid nephrotoxins   Hyperglycemia Lab Results  Component Value  Date   HGBA1C 6.4 09/17/2021   Stable, pt to continue current medical treatment  - diet   Hypothyroidism Lab Results  Component Value Date   TSH 4.74 09/17/2021   Stable, pt to continue levothyroxine   Non-allergic asthma Stable, cont inhaler prn  Thrombocytosis ([facility]) Chronic mild stable, to f/u any worsening symptoms or concerns Lab Results  Component Value Date   WBC 6.4 09/17/2021   HGB 13.6 09/17/2021   HCT 41.6 09/17/2021   MCV 85.8 09/17/2021   PLT 534.0 (H) 09/17/2021    Followup: Return in about 6 months (around 03/17/2022).  Cathlean Cower, MD 09/21/2021 8:46 PM [facility]

## 2021-09-21 ENCOUNTER — Encounter: Payer: Self-pay | Admitting: Internal Medicine

## 2021-09-21 NOTE — Assessment & Plan Note (Signed)
Chronic mild stable, to f/u any worsening symptoms or concerns Lab Results  Component Value Date   WBC 6.4 09/17/2021   HGB 13.6 09/17/2021   HCT 41.6 09/17/2021   MCV 85.8 09/17/2021   PLT 534.0 (H) 09/17/2021

## 2021-09-21 NOTE — Assessment & Plan Note (Signed)
Lab Results  Component Value Date   TSH 4.74 09/17/2021   Stable, pt to continue levothyroxine

## 2021-09-21 NOTE — Assessment & Plan Note (Signed)
BP Readings from Last 3 Encounters:  09/17/21 122/90  03/06/21 122/74  12/31/20 (!) 160/96   Stable, pt to continue medical treatment  - diet, wt control, low salt

## 2021-09-21 NOTE — Assessment & Plan Note (Signed)
Lab Results  Component Value Date   CREATININE 1.13 09/17/2021   Stable overall, cont to avoid nephrotoxins

## 2021-09-21 NOTE — Assessment & Plan Note (Signed)
Stable, cont inhaler prn 

## 2021-09-21 NOTE — Assessment & Plan Note (Signed)
Lab Results  Component Value Date   HGBA1C 6.4 09/17/2021   Stable, pt to continue current medical treatment  - diet

## 2021-09-21 NOTE — Assessment & Plan Note (Signed)
Age and sex appropriate education and counseling updated with regular exercise and diet Referrals for preventative services - none needed Immunizations addressed - declines shingirx, tdap, pneumovax Smoking counseling  - none needed Evidence for depression or other mood disorder - none significant Most recent labs reviewed. I have personally reviewed and have noted: 1) the patient's medical and social history 2) The patient's current medications and supplements 3) The patient's height, weight, and BMI have been recorded in the chart

## 2021-09-22 DIAGNOSIS — L986 Other infiltrative disorders of the skin and subcutaneous tissue: Secondary | ICD-10-CM | POA: Diagnosis not present

## 2021-12-08 ENCOUNTER — Ambulatory Visit (INDEPENDENT_AMBULATORY_CARE_PROVIDER_SITE_OTHER): Payer: Medicare Other

## 2021-12-08 ENCOUNTER — Telehealth: Payer: Self-pay

## 2021-12-08 DIAGNOSIS — Z0000 Encounter for general adult medical examination without abnormal findings: Secondary | ICD-10-CM | POA: Diagnosis not present

## 2021-12-08 NOTE — Telephone Encounter (Signed)
Called patient x2 no answer no voice mail , patient may reschedule for next avail;able appointment . ? ?L.Monico Sudduth,LPN  ?

## 2021-12-08 NOTE — Patient Instructions (Signed)
[name] [name] , ?Thank you for taking time to come for your Medicare Wellness Visit. I appreciate your ongoing commitment to your health goals. Please review the following plan we discussed and let me know if I can assist you in the future.  ? ?Screening recommendations/referrals: ?Colonoscopy: declined  ?Mammogram: declined  ?Bone Density: 02/03/2017 ?Recommended yearly ophthalmology/optometry visit for glaucoma screening and checkup ?Recommended yearly dental visit for hygiene and checkup ? ?Vaccinations: ?Influenza vaccine: declined  ?Pneumococcal: declined  ?Tdap vaccine: declined  ?Shingles vaccine: declined    ? ?Advanced directives: yes  ? ?Conditions/risks identified: none  ? ?Next appointment: none  ? ? ?Preventive Care 73 Years and Older, Female ?Preventive care refers to lifestyle choices and visits with your health care provider that can promote health and wellness. ?What does preventive care include? ?A yearly physical exam. This is also called an annual well check. ?Dental exams once or twice a year. ?Routine eye exams. Ask your health care provider how often you should have your eyes checked. ?Personal lifestyle choices, including: ?Daily care of your teeth and gums. ?Regular physical activity. ?Eating a healthy diet. ?Avoiding tobacco and drug use. ?Limiting alcohol use. ?Practicing safe sex. ?Taking low-dose aspirin every day. ?Taking vitamin and mineral supplements as recommended by your health care provider. ?What happens during an annual well check? ?The services and screenings done by your health care provider during your annual well check will depend on your age, overall health, lifestyle risk factors, and family history of disease. ?Counseling  ?Your health care provider may ask you questions about your: ?Alcohol use. ?Tobacco use. ?Drug use. ?Emotional well-being. ?Home and relationship well-being. ?Sexual activity. ?Eating habits. ?History of falls. ?Memory and ability to understand  (cognition). ?Work and work Statistician. ?Reproductive health. ?Screening  ?You may have the following tests or measurements: ?Height, weight, and BMI. ?Blood pressure. ?Lipid and cholesterol levels. These may be checked every 5 years, or more frequently if you are over 45 years old. ?Skin check. ?Lung cancer screening. You may have this screening every year starting at age 67 if you have a 30-pack-year history of smoking and currently smoke or have quit within the past 15 years. ?Fecal occult blood test (FOBT) of the stool. You may have this test every year starting at age 9. ?Flexible sigmoidoscopy or colonoscopy. You may have a sigmoidoscopy every 5 years or a colonoscopy every 10 years starting at age 66. ?Hepatitis C blood test. ?Hepatitis B blood test. ?Sexually transmitted disease (STD) testing. ?Diabetes screening. This is done by checking your blood sugar (glucose) after you have not eaten for a while (fasting). You may have this done every 1-3 years. ?Bone density scan. This is done to screen for osteoporosis. You may have this done starting at age 77. ?Mammogram. This may be done every 1-2 years. Talk to your health care provider about how often you should have regular mammograms. ?Talk with your health care provider about your test results, treatment options, and if necessary, the need for more tests. ?Vaccines  ?Your health care provider may recommend certain vaccines, such as: ?Influenza vaccine. This is recommended every year. ?Tetanus, diphtheria, and acellular pertussis (Tdap, Td) vaccine. You may need a Td booster every 10 years. ?Zoster vaccine. You may need this after age 3. ?Pneumococcal 13-valent conjugate (PCV13) vaccine. One dose is recommended after age 65. ?Pneumococcal polysaccharide (PPSV23) vaccine. One dose is recommended after age 77. ?Talk to your health care provider about which screenings and vaccines you need and how  often you need them. ?This information is not intended to  replace advice given to you by your health care provider. Make sure you discuss any questions you have with your health care provider. ?Document Released: 09/27/2015 Document Revised: 05/20/2016 Document Reviewed: 07/02/2015 ?Elsevier Interactive Patient Education ? 2017 [place]. ? ?Fall Prevention in the Home ?Falls can cause injuries. They can happen to people of all ages. There are many things you can do to make your home safe and to help prevent falls. ?What can I do on the outside of my home? ?Regularly fix the edges of walkways and driveways and fix any cracks. ?Remove anything that might make you trip as you walk through a door, such as a raised step or threshold. ?Trim any bushes or trees on the path to your home. ?Use bright outdoor lighting. ?Clear any walking paths of anything that might make someone trip, such as rocks or tools. ?Regularly check to see if handrails are loose or broken. Make sure that both sides of any steps have handrails. ?Any raised decks and porches should have guardrails on the edges. ?Have any leaves, snow, or ice cleared regularly. ?Use sand or salt on walking paths during winter. ?Clean up any spills in your garage right away. This includes oil or grease spills. ?What can I do in the bathroom? ?Use night lights. ?Install grab bars by the toilet and in the tub and shower. Do not use towel bars as grab bars. ?Use non-skid mats or decals in the tub or shower. ?If you need to sit down in the shower, use a plastic, non-slip stool. ?Keep the floor dry. Clean up any water that spills on the floor as soon as it happens. ?Remove soap buildup in the tub or shower regularly. ?Attach bath mats securely with double-sided non-slip rug tape. ?Do not have throw rugs and other things on the floor that can make you trip. ?What can I do in the bedroom? ?Use night lights. ?Make sure that you have a light by your bed that is easy to reach. ?Do not use any sheets or blankets that are too big for  your bed. They should not hang down onto the floor. ?Have a firm chair that has side arms. You can use this for support while you get dressed. ?Do not have throw rugs and other things on the floor that can make you trip. ?What can I do in the kitchen? ?Clean up any spills right away. ?Avoid walking on wet floors. ?Keep items that you use a lot in easy-to-reach places. ?If you need to reach something above you, use a strong step stool that has a grab bar. ?Keep electrical cords out of the way. ?Do not use floor polish or wax that makes floors slippery. If you must use wax, use non-skid floor wax. ?Do not have throw rugs and other things on the floor that can make you trip. ?What can I do with my stairs? ?Do not leave any items on the stairs. ?Make sure that there are handrails on both sides of the stairs and use them. Fix handrails that are broken or loose. Make sure that handrails are as long as the stairways. ?Check any carpeting to make sure that it is firmly attached to the stairs. Fix any carpet that is loose or worn. ?Avoid having throw rugs at the top or bottom of the stairs. If you do have throw rugs, attach them to the floor with carpet tape. ?Make sure that you have a  light switch at the top of the stairs and the bottom of the stairs. If you do not have them, ask someone to add them for you. ?What else can I do to help prevent falls? ?Wear shoes that: ?Do not have high heels. ?Have rubber bottoms. ?Are comfortable and fit you well. ?Are closed at the toe. Do not wear sandals. ?If you use a stepladder: ?Make sure that it is fully opened. Do not climb a closed stepladder. ?Make sure that both sides of the stepladder are locked into place. ?Ask someone to hold it for you, if possible. ?Clearly mark and make sure that you can see: ?Any grab bars or handrails. ?First and last steps. ?Where the edge of each step is. ?Use tools that help you move around (mobility aids) if they are needed. These  include: ?Canes. ?Walkers. ?Scooters. ?Crutches. ?Turn on the lights when you go into a dark area. Replace any light bulbs as soon as they burn out. ?Set up your furniture so you have a clear path. Avoid moving your furniture around. ?I

## 2021-12-08 NOTE — Progress Notes (Signed)
? ?Subjective:  ? [name] [name] is a 71 y.o. female who presents for Medicare Annual (Subsequent) preventive examination. ? ?I connected with Kambree Krauss today by telephone and verified that I am speaking with the correct person using two identifiers. ?Location patient: home ?Location provider: work ?Persons participating in the virtual visit: patient, provider. ?  ?I discussed the limitations, risks, security and privacy concerns of performing an evaluation and management service by telephone and the availability of in person appointments. I also discussed with the patient that there may be a patient responsible charge related to this service. The patient expressed understanding and verbally consented to this telephonic visit.  ?  ?Interactive audio and video telecommunications were attempted between this provider and patient, however failed, due to patient having technical difficulties OR patient did not have access to video capability.  We continued and completed visit with audio only. ? ?  ?Review of Systems    ? ?Cardiac Risk Factors include: advanced age (>65mn, >>80women) ? ?   ?Objective:  ?  ?Today's Vitals  ? ?There is no height or weight on file to calculate BMI. ? ? ?  12/08/2021  ?  1:48 PM 11/26/2020  ?  9:16 AM 06/28/2019  ?  9:00 AM 08/25/2018  ?  7:43 PM 08/24/2018  ?  2:03 PM 08/22/2018  ? 12:14 PM 12/07/2015  ?  1:02 PM  ?Advanced Directives  ?Does Patient Have a Medical Advance Directive? Yes Yes Yes Yes Yes Yes No  ?Type of [place]aramed[place]of AForest[place]Living will Living will;Healthcare Power of [place]ai[place] of facility DNR (pink MOST or yellow form) HMan[place]Living will HF[place]Living will HWink[place]Living will    ?Does patient want to make changes to medical advance directive?  No - Patient declined  No - Patient declined  No - Patient declined   ?Copy of [place] Chart? No - copy requested  No - copy requested No - copy requested Yes - validated most recent copy scanned in chart (See row information)     ? ? ?Current Medications (verified) ?Outpatient Encounter Medications as of 12/08/2021  ?Medication Sig  ? Ascorbic Acid (VITAMIN C) 1000 MG tablet Take 1,000 mg by mouth daily.  ? aspirin-acetaminophen-caffeine (EXCEDRIN MIGRAINE) 250-250-65 MG per tablet Take 1 tablet by mouth every 6 (six) hours as needed. For migraine  ? Cyanocobalamin (VITAMIN B 12 PO) Take 1 tablet by mouth daily.  ? cyclobenzaprine (FLEXERIL) 10 MG tablet Take 1 tablet (10 mg total) by mouth 3 (three) times daily. Annual appt due in May must see provider for future refills (Patient taking differently: Take 5-10 mg by mouth 3 (three) times daily as needed for muscle spasms.)  ? EPINEPHrine 0.3 mg/0.3 mL IJ SOAJ injection As directed for severe allergic reaction (Patient taking differently: Inject 0.3 mg into the muscle once as needed for anaphylaxis.)  ? levalbuterol (XOPENEX HFA) 45 MCG/ACT inhaler Inhale 1-2 puffs into the lungs every 6 (six) hours as needed. For shortness of breath  ? levalbuterol (XOPENEX) 1.25 MG/3ML nebulizer solution Take 1.25 mg by nebulization every 4 (four) hours as needed for wheezing.  ? Melatonin 10 MG CAPS Take 10 mg by mouth at bedtime as needed (sleep).   ? mometasone-formoterol (DULERA) 200-5 MCG/ACT AERO Inhale 2 puffs into the lungs 2 (two) times daily. Rinse mouth  ? pantoprazole (PROTONIX) 40 MG tablet TAKE 1 TABLET BY MOUTH DAILY  ? triamcinolone  ointment (KENALOG) 0.1 % Apply topically.  ? vitamin E 400 UNIT capsule Take 400 Units by mouth daily.  ? zolpidem (AMBIEN) 10 MG tablet TAKE 1 TABLET BY MOUTH AT BEDTIME  ? diphenhydrAMINE (BENADRYL) 25 MG tablet Take 1 tablet (25 mg total) by mouth every 6 (six) hours as needed for up to 5 days.  ? ?No facility-administered encounter medications on file as of 12/08/2021.  ? ? ?Allergies (verified) ?Albuterol, Albuterol sulfate, Amoxicillin-pot  clavulanate, Broccoli [brassica oleracea], Covid-19 (mrna) vaccine (pfizer) [covid-19 (mrna) vaccine], Drug ingredient [acetylcysteine], Epipen [epinephrine], Fluzone, Influenza vaccines, Morphine, Penicillin g, Povidone-iodine, Sulfonamide derivatives, Atrovent [ipratropium], Ciprofloxacin, Hyoscyamine sulfate, Latex, Meperidine hcl, Other, Pneumococcal vaccines, Ppd [tuberculin purified protein derivative], Reglan [metoclopramide], Zostavax [zoster vaccine live], and Pepcid [famotidine]  ? ?History: ?Past Medical History:  ?Diagnosis Date  ? ASTHMA   ? BREAST CANCER, HX OF 11/1979  ? CKD (chronic kidney disease) stage 3, GFR 30-59 ml/min (HCC) 03/05/2020  ? GERD   ? GI bleed 11/92, 11/93, 11/94  ? GI Bleed  ? Irritable bowel syndrome   ? MIGRAINE HEADACHE   ? Symptomatic anemia 08/24/2018  ? ?Past Surgical History:  ?Procedure Laterality Date  ? ABDOMINAL HYSTERECTOMY  11/1979  ? BREAST SURGERY    ? ESOPHAGOGASTRODUODENOSCOPY N/A 08/26/2018  ? Procedure: ESOPHAGOGASTRODUODENOSCOPY (EGD);  Surgeon: Doran Stabler, MD;  Location: [facility];  Service: Gastroenterology;  Laterality: N/A;  ? Left wrist  1963  ? MASTECTOMY  11/1979  ? Bilaterally & implants  ? OTHER SURGICAL HISTORY Bilateral   ? removal of breast implants  ? TONSILLECTOMY  1955  ? TUBAL LIGATION  1975  ? ?Family History  ?Problem Relation Age of Onset  ? Coronary artery disease Father 84  ?     CABG age 33  ? Breast cancer Mother   ? Uterine cancer Mother   ? Coronary artery disease Mother   ? Endometriosis Sister   ? Colon polyps Sister 8  ?     1/2 sister - same dad  ? Irritable bowel syndrome Sister   ? Hyperlipidemia Brother   ?     1/2 bro, same dad  ? Hypertension Brother   ?     1/2 bro, same dad  ? Heart disease Brother   ? Colon cancer Neg Hx   ? Liver cancer Neg Hx   ? ?Social History  ? ?Socioeconomic History  ? Marital status: Married  ?  Spouse name: Not on file  ? Number of children: 1  ? Years of education: Not on file  ? Highest  education level: Not on file  ?Occupational History  ? Occupation: retired  ?Tobacco Use  ? Smoking status: Never  ? Smokeless tobacco: Never  ?Vaping Use  ? Vaping Use: Never used  ?Substance and Sexual Activity  ? Alcohol use: Yes  ?  Alcohol/week: 0.0 standard drinks  ?  Comment: occ  ? Drug use: No  ? Sexual activity: Not on file  ?Other Topics Concern  ? Not on file  ?Social History Narrative  ? Married, lives at home with spouse.   ? 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 ?Social Determinants of Health  ? ?Financial Resource Strain: Low Risk   ? Difficulty of Paying Living Expenses: Not hard at all  ?Food Insecurity: No Food Insecurity  ? Worried About Charity fundraiser in the Last Year: Never true  ? Ran Out  of Food in the Last Year: Never true  ?Transportation Needs: No Transportation Needs  ? Lack of Transportation (Medical): No  ? Lack of Transportation (Non-Medical): No  ?Physical Activity: Insufficiently Active  ? Days of Exercise per Week: 2 days  ? Minutes of Exercise per Session: 10 min  ?Stress: No Stress Concern Present  ? Feeling of Stress : Not at all  ?Social Connections: Moderately Integrated  ? Frequency of Communication with Friends and Family: Twice a week  ? Frequency of Social Gatherings with Friends and Family: Twice a week  ? Attends Religious Services: Never  ? Active Member of Clubs or Organizations: Yes  ? Attends Archivist Meetings: 1 to 4 times per year  ? Marital Status: Living with partner  ? ? ?Tobacco Counseling ?Counseling given: Not Answered ? ? ?Clinical Intake: ? ?Pre-visit preparation completed: Yes ? ?Pain : No/denies pain ? ?  ? ?Nutritional Risks: None ?Diabetes: No ? ?How often do you need to have someone help you when you read instructions, pamphlets, or other written materials from your doctor or pharmacy?: 1 - Never ?What is the last grade level you completed in school?: college ? ?Diabetic?no  ? ?Interpreter  Needed?: No ? ?Information entered by :: [email or web address] ? ? ?Activities of Daily Living ? ?  12/08/2021  ?  1:52 PM  ?In your present state of health, do you have any difficulty performing the following activities:

## 2021-12-10 ENCOUNTER — Other Ambulatory Visit: Payer: Self-pay | Admitting: Internal Medicine

## 2021-12-10 DIAGNOSIS — R5383 Other fatigue: Secondary | ICD-10-CM

## 2022-02-03 ENCOUNTER — Other Ambulatory Visit: Payer: Self-pay | Admitting: Internal Medicine

## 2022-02-03 NOTE — Telephone Encounter (Signed)
Please refill as per office routine med refill policy (all routine meds to be refilled for 3 mo or monthly (per pt preference) up to one year from last visit, then month to month grace period for 3 mo, then further med refills will have to be denied) ? ?

## 2022-03-31 ENCOUNTER — Encounter: Payer: Self-pay | Admitting: Internal Medicine

## 2022-03-31 ENCOUNTER — Ambulatory Visit (INDEPENDENT_AMBULATORY_CARE_PROVIDER_SITE_OTHER): Payer: Medicare Other | Admitting: Internal Medicine

## 2022-03-31 ENCOUNTER — Ambulatory Visit (INDEPENDENT_AMBULATORY_CARE_PROVIDER_SITE_OTHER): Payer: Medicare Other

## 2022-03-31 VITALS — BP 126/74 | HR 91 | Temp 97.5°F | Ht 63.0 in | Wt 157.0 lb

## 2022-03-31 DIAGNOSIS — N1831 Chronic kidney disease, stage 3a: Secondary | ICD-10-CM

## 2022-03-31 DIAGNOSIS — M5431 Sciatica, right side: Secondary | ICD-10-CM

## 2022-03-31 DIAGNOSIS — M25551 Pain in right hip: Secondary | ICD-10-CM

## 2022-03-31 DIAGNOSIS — E039 Hypothyroidism, unspecified: Secondary | ICD-10-CM | POA: Diagnosis not present

## 2022-03-31 DIAGNOSIS — R739 Hyperglycemia, unspecified: Secondary | ICD-10-CM | POA: Diagnosis not present

## 2022-03-31 DIAGNOSIS — J45909 Unspecified asthma, uncomplicated: Secondary | ICD-10-CM

## 2022-03-31 LAB — BASIC METABOLIC PANEL
BUN: 13 mg/dL (ref 6–23)
CO2: 27 mEq/L (ref 19–32)
Calcium: 9.8 mg/dL (ref 8.4–10.5)
Chloride: 103 mEq/L (ref 96–112)
Creatinine, Ser: 1.1 mg/dL (ref 0.40–1.20)
GFR: 49.9 mL/min — ABNORMAL LOW (ref >60.00)
Glucose, Bld: 89 mg/dL (ref 70–99)
Potassium: 3.8 mEq/L (ref 3.5–5.1)
Sodium: 138 mEq/L (ref 135–145)

## 2022-03-31 LAB — HEPATIC FUNCTION PANEL
ALT: 18 U/L (ref 0–35)
AST: 19 U/L (ref 0–37)
Albumin: 4.6 g/dL (ref 3.5–5.2)
Alkaline Phosphatase: 69 U/L (ref 39–117)
Bilirubin, Direct: 0 mg/dL (ref 0.0–0.3)
Total Bilirubin: 0.4 mg/dL (ref 0.2–1.2)
Total Protein: 7.2 g/dL (ref 6.0–8.3)

## 2022-03-31 LAB — CBC WITH DIFFERENTIAL/PLATELET
Basophils Absolute: 0.1 10*3/uL (ref 0.0–0.1)
Basophils Relative: 1.5 % (ref 0.0–3.0)
Eosinophils Absolute: 0.3 10*3/uL (ref 0.0–0.7)
Eosinophils Relative: 4.1 % (ref 0.0–5.0)
HCT: 40.3 % (ref 36.0–46.0)
Hemoglobin: 13.2 g/dL (ref 12.0–15.0)
Lymphocytes Relative: 36.5 % (ref 12.0–46.0)
Lymphs Abs: 2.3 10*3/uL (ref 0.7–4.0)
MCHC: 32.8 g/dL (ref 30.0–36.0)
MCV: 85.6 fl (ref 78.0–100.0)
Monocytes Absolute: 0.5 10*3/uL (ref 0.1–1.0)
Monocytes Relative: 7.3 % (ref 3.0–12.0)
Neutro Abs: 3.3 10*3/uL (ref 1.4–7.7)
Neutrophils Relative %: 50.6 % (ref 43.0–77.0)
Platelets: 510 10*3/uL — ABNORMAL HIGH (ref 150.0–400.0)
RBC: 4.71 Mil/uL (ref 3.87–5.11)
RDW: 14.5 % (ref 11.5–15.5)
WBC: 6.4 10*3/uL (ref 4.0–10.5)

## 2022-03-31 LAB — HEMOGLOBIN A1C: Hgb A1c MFr Bld: 6.3 % (ref 4.6–6.5)

## 2022-03-31 LAB — TSH: TSH: 3.84 u[IU]/mL (ref 0.35–5.50)

## 2022-03-31 MED ORDER — DULERA 200-5 MCG/ACT IN AERO: 2.0000 | INHALATION_SPRAY | Freq: Two times a day (BID) | RESPIRATORY_TRACT | 5 refills | Status: DC

## 2022-03-31 MED ORDER — LEVALBUTEROL HCL 1.25 MG/3ML IN NEBU: 1.2500 mg | INHALATION_SOLUTION | RESPIRATORY_TRACT | 12 refills | Status: AC | PRN

## 2022-03-31 MED ORDER — LEVALBUTEROL TARTRATE 45 MCG/ACT IN AERO: 1.0000 | INHALATION_SPRAY | Freq: Four times a day (QID) | RESPIRATORY_TRACT | 12 refills | Status: DC | PRN

## 2022-03-31 MED ORDER — GABAPENTIN 100 MG PO CAPS: 100.0000 mg | ORAL_CAPSULE | Freq: Three times a day (TID) | ORAL | 5 refills | Status: DC

## 2022-03-31 NOTE — Progress Notes (Signed)
Patient ID: [name] [name], female   DOB: 1951-07-24, 71 y.o.   MRN: [id number]        Chief Complaint: follow up ckd, right lower back pain and possible right hip pain       HPI:  [name] [name] is a 71 y.o. female here with husband today; Pt denies chest pain, increased sob or doe, wheezing, orthopnea, PND, increased LE swelling, palpitations, dizziness or syncope.   Pt denies polydipsia, polyuria, or new focal neuro s/s.    Pt denies fever, wt loss, night sweats, loss of appetite, or other constitutional symptoms  Also c/o pain to the right lower back with radiation to the right lateral hip and upper right leg to knee thought c/w sciatica in past, also has pain on walking to the right anterior hip joint area, nothing else makes better or worse, mild, intermittent for over 1 wk.   Denies hyper or hypo thyroid symptoms such as voice, skin or hair change.        Wt Readings from Last 3 Encounters:  03/31/22 157 lb (71.2 kg)  09/17/21 157 lb (71.2 kg)  03/06/21 158 lb (71.7 kg)   BP Readings from Last 3 Encounters:  03/31/22 126/74  09/17/21 122/90  03/06/21 122/74         Past Medical History:  Diagnosis Date   ASTHMA    BREAST CANCER, HX OF 11/1979   CKD (chronic kidney disease) stage 3, GFR 30-59 ml/min ([place]) 03/05/2020   GERD    GI bleed 11/92, 11/93, 11/94   GI Bleed   Irritable bowel syndrome    MIGRAINE HEADACHE    Symptomatic anemia 08/24/2018   Past Surgical History:  Procedure Laterality Date   ABDOMINAL HYSTERECTOMY  11/1979   BREAST SURGERY     ESOPHAGOGASTRODUODENOSCOPY N/A 08/26/2018   Procedure: ESOPHAGOGASTRODUODENOSCOPY (EGD);  Surgeon: Doran Stabler, MD;  Location: [facility];  Service: Gastroenterology;  Laterality: N/A;   Left wrist  1963   MASTECTOMY  11/1979   Bilaterally & implants   OTHER SURGICAL HISTORY Bilateral    removal of breast implants   [place]    reports that she has never smoked. She has never used  smokeless tobacco. She reports current alcohol use. She reports that she does not use drugs. family history includes Breast cancer in her mother; Colon polyps (age of onset: 14) in her sister; Coronary artery disease in her mother; Coronary artery disease (age of onset: 110) in her father; Endometriosis in her sister; Heart disease in her brother; Hyperlipidemia in her brother; Hypertension in her brother; Irritable bowel syndrome in her sister; Uterine cancer in her mother. Allergies  Allergen Reactions   Albuterol Anaphylaxis    Pt states she has tolerated levalbuterol   Albuterol Sulfate Anaphylaxis    Pt states she has tolerated levalbuterol   Amoxicillin-Pot Clavulanate Anaphylaxis    Lips swell    Broccoli [Brassica Oleracea] Anaphylaxis   Covid-19 (Mrna) Vaccine Therapist, music) [Covid-19 (Mrna) Vaccine] Anaphylaxis   Drug Ingredient [Acetylcysteine] Shortness Of Breath   Epipen [Epinephrine] Other (See Comments)    Allergic to preservatives in Epipen with caution!!!   Fluzone Anaphylaxis   Influenza Vaccines Anaphylaxis   Morphine Anaphylaxis   Penicillin G Anaphylaxis    All cillins All cillins   Povidone-Iodine Anaphylaxis and Hives   Sulfonamide Derivatives Anaphylaxis   Atrovent [Ipratropium] Other (See Comments)    Has sulfa in preservative   Ciprofloxacin  Itching    Itchy palms   Hyoscyamine Sulfate Hives   Latex Itching   Meperidine Hcl Nausea And Vomiting    REACTION: N\T\V   Other Other (See Comments) and Swelling    Mucomist-unable to breathe Arm swelling with red streaks   Pneumococcal Vaccines Other (See Comments)    Pt declines   Ppd [Tuberculin Purified Protein Derivative] Swelling and Other (See Comments)    Arm swelling with red streaks   Reglan [Metoclopramide] Other (See Comments)   Zostavax [Zoster Vaccine Live] Other (See Comments)    Pt declines   Pepcid [Famotidine] Nausea And Vomiting, Palpitations and Other (See Comments)    Due to sulfide preservative    Current Outpatient Medications on File Prior to Visit  Medication Sig Dispense Refill   Ascorbic Acid (VITAMIN C) 1000 MG tablet Take 1,000 mg by mouth daily.     aspirin-acetaminophen-caffeine (EXCEDRIN MIGRAINE) 250-250-65 MG per tablet Take 1 tablet by mouth every 6 (six) hours as needed. For migraine     Cyanocobalamin (VITAMIN B 12 PO) Take 1 tablet by mouth daily.     cyclobenzaprine (FLEXERIL) 10 MG tablet Take 1 tablet by mouth 3 times daily. Annual appt due in May must see provider for future refills 60 tablet 2   EPINEPHrine 0.3 mg/0.3 mL IJ SOAJ injection As directed for severe allergic reaction (Patient taking differently: Inject 0.3 mg into the muscle once as needed for anaphylaxis.) 2 each 12   Melatonin 10 MG CAPS Take 10 mg by mouth at bedtime as needed (sleep).      pantoprazole (PROTONIX) 40 MG tablet TAKE 1 TABLET BY MOUTH EVERY DAY 90 tablet 2   triamcinolone ointment (KENALOG) 0.1 % Apply topically.     vitamin E 400 UNIT capsule Take 400 Units by mouth daily.     zolpidem (AMBIEN) 10 MG tablet TAKE 1 TABLET BY MOUTH AT BEDTIME 90 tablet 1   diphenhydrAMINE (BENADRYL) 25 MG tablet Take 1 tablet (25 mg total) by mouth every 6 (six) hours as needed for up to 5 days. 20 tablet 0   No current facility-administered medications on file prior to visit.        ROS:  All others reviewed and negative.  Objective        PE:  BP 126/74 (BP Location: Left Arm, Patient Position: Sitting, Cuff Size: Normal)   Pulse 91   Temp (!) 97.5 F (36.4 C) (Oral)   Ht '5\' 3"'$  (1.6 m)   Wt 157 lb (71.2 kg)   SpO2 96%   BMI 27.81 kg/m                 Constitutional: Pt appears in NAD               HENT: Head: NCAT.                Right Ear: External ear normal.                 Left Ear: External ear normal.                Eyes: . Pupils are equal, round, and reactive to light. Conjunctivae and EOM are normal               Nose: without d/c or deformity               Neck: Neck supple.  Gross normal ROM  Cardiovascular: Normal rate and regular rhythm.                 Pulmonary/Chest: Effort normal and breath sounds without rales or wheezing.                Abd:  Soft, NT, ND, + BS, no organomegaly               Neurological: Pt is alert. At baseline orientation, motor grossly intact               Skin: Skin is warm. No rashes, no other new lesions, LE edema - none               Right hip with pain on external rotation               Psychiatric: Pt behavior is normal without agitation   Micro: none  Cardiac tracings I have personally interpreted today:  none  Pertinent Radiological findings (summarize): none   Lab Results  Component Value Date   WBC 6.4 03/31/2022   HGB 13.2 03/31/2022   HCT 40.3 03/31/2022   PLT 510.0 (H) 03/31/2022   GLUCOSE 89 03/31/2022   CHOL 220 (H) 09/17/2021   TRIG 285.0 (H) 09/17/2021   HDL 59.50 09/17/2021   LDLDIRECT 88.0 09/17/2021   LDLCALC 133 (H) 02/03/2017   ALT 18 03/31/2022   AST 19 03/31/2022   NA 138 03/31/2022   K 3.8 03/31/2022   CL 103 03/31/2022   CREATININE 1.10 03/31/2022   BUN 13 03/31/2022   CO2 27 03/31/2022   TSH 3.84 03/31/2022   INR 1.0 08/24/2018   HGBA1C 6.3 03/31/2022   Assessment/Plan:  CHLOIE LONEY is a 71 y.o. White or Caucasian [1] female with  has a past medical history of ASTHMA, BREAST CANCER, HX OF (11/1979), CKD (chronic kidney disease) stage 3, GFR 30-59 ml/min (HCC) (03/05/2020), GERD, GI bleed (11/92, 11/93, 11/94), Irritable bowel syndrome, MIGRAINE HEADACHE, and Symptomatic anemia (08/24/2018).  Non-allergic asthma Stable overall, cont inhaler prn  Hypothyroidism Lab Results  Component Value Date   TSH 3.84 03/31/2022   Stable, pt to continue current tx - no apparent need for levothryoxine at this time   Hyperglycemia Lab Results  Component Value Date   HGBA1C 6.3 03/31/2022   Stable, pt to continue current medical treatment  - diet, wt control   CKD (chronic  kidney disease) stage 3, GFR 30-59 ml/min Lab Results  Component Value Date   CREATININE 1.10 03/31/2022   Stable overall, cont to avoid nephrotoxins   Right sided sciatica Mod chronic persistent worsening severity and freq in past 2 wks, for start gabapentin 100 tid, refer Dr Valerie Salts per pt request  Right hip pain Cant r/o underlying right hip djd - for xray  Followup: Return in about 6 months (around 10/01/2022).  Cathlean Cower, MD 04/04/2022 2:24 PM [facility]

## 2022-03-31 NOTE — Patient Instructions (Addendum)
Please take all new medication as prescribed - the gabapentin  Please continue all other medications as before, and refills have been done if requested - the 3 inhalers  Please have the pharmacy call with any other refills you may need.  Please continue your efforts at being more active, low cholesterol diet, and weight control  Please keep your appointments with your specialists as you may have planned  You will be contacted regarding the referral for: Dr Rush Farmer  Please go to the XRAY Department in the first floor for the x-ray testing  Please go to the LAB at the blood drawing area for the tests to be done  You will be contacted by phone if any changes need to be made immediately.  Otherwise, you will receive a letter about your results with an explanation, but please check with MyChart first.  Please remember to sign up for MyChart if you have not done so, as this will be important to you in the future with finding out test results, communicating by private email, and scheduling acute appointments online when needed.  Please make an Appointment to return in 6 months, or sooner if needed

## 2022-04-04 ENCOUNTER — Encounter: Payer: Self-pay | Admitting: Internal Medicine

## 2022-04-04 NOTE — Assessment & Plan Note (Signed)
Mod chronic persistent worsening severity and freq in past 2 wks, for start gabapentin 100 tid, refer Dr Valerie Salts per pt request

## 2022-04-04 NOTE — Assessment & Plan Note (Signed)
Lab Results  Component Value Date   CREATININE 1.10 03/31/2022   Stable overall, cont to avoid nephrotoxins

## 2022-04-04 NOTE — Assessment & Plan Note (Signed)
Stable overall, cont inhaler prn 

## 2022-04-04 NOTE — Assessment & Plan Note (Signed)
Lab Results  Component Value Date   HGBA1C 6.3 03/31/2022   Stable, pt to continue current medical treatment  - diet, wt control

## 2022-04-04 NOTE — Assessment & Plan Note (Signed)
Cant r/o underlying right hip djd - for xray

## 2022-04-04 NOTE — Assessment & Plan Note (Signed)
Lab Results  Component Value Date   TSH 3.84 03/31/2022   Stable, pt to continue current tx - no apparent need for levothryoxine at this time

## 2022-04-16 ENCOUNTER — Ambulatory Visit (INDEPENDENT_AMBULATORY_CARE_PROVIDER_SITE_OTHER): Payer: Medicare Other

## 2022-04-16 ENCOUNTER — Ambulatory Visit: Payer: Medicare Other | Admitting: Orthopaedic Surgery

## 2022-04-16 ENCOUNTER — Other Ambulatory Visit: Payer: Self-pay

## 2022-04-16 DIAGNOSIS — M79604 Pain in right leg: Secondary | ICD-10-CM | POA: Diagnosis not present

## 2022-04-16 DIAGNOSIS — G8929 Other chronic pain: Secondary | ICD-10-CM | POA: Diagnosis not present

## 2022-04-16 DIAGNOSIS — M5441 Lumbago with sciatica, right side: Secondary | ICD-10-CM
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
## 2022-04-30 ENCOUNTER — Encounter: Payer: Self-pay | Admitting: Physical Therapy

## 2022-04-30 ENCOUNTER — Ambulatory Visit: Payer: Medicare Other | Admitting: Physical Therapy

## 2022-04-30 DIAGNOSIS — R252 Cramp and spasm: Secondary | ICD-10-CM | POA: Diagnosis not present

## 2022-04-30 DIAGNOSIS — M25551 Pain in right hip: Secondary | ICD-10-CM | POA: Diagnosis not present

## 2022-04-30 DIAGNOSIS — M5459 Other low back pain: Secondary | ICD-10-CM | POA: Diagnosis not present

## 2022-04-30 DIAGNOSIS — M6281 Muscle weakness (generalized): Secondary | ICD-10-CM

## 2022-04-30 DIAGNOSIS — R293 Abnormal posture: Secondary | ICD-10-CM | POA: Diagnosis not present
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
## 2022-05-06 ENCOUNTER — Ambulatory Visit: Payer: Medicare Other | Admitting: Physical Therapy

## 2022-05-06 ENCOUNTER — Encounter: Payer: Self-pay | Admitting: Physical Therapy

## 2022-05-06 DIAGNOSIS — R293 Abnormal posture: Secondary | ICD-10-CM

## 2022-05-06 DIAGNOSIS — M25551 Pain in right hip: Secondary | ICD-10-CM

## 2022-05-06 DIAGNOSIS — R252 Cramp and spasm: Secondary | ICD-10-CM | POA: Diagnosis not present

## 2022-05-06 DIAGNOSIS — M6281 Muscle weakness (generalized): Secondary | ICD-10-CM | POA: Diagnosis not present

## 2022-05-06 DIAGNOSIS — M5459 Other low back pain: Secondary | ICD-10-CM | POA: Diagnosis not present

## 2022-05-06 NOTE — Therapy (Signed)
OUTPATIENT PHYSICAL THERAPY TREATMENT NOTE   Patient Name: [name] [name] MRN: [id number] DOB:1951-02-10, 71 y.o., female Today's Date: 05/06/2022  END OF SESSION:   PT End of Session - 05/06/22 1524     Visit Number 2    Number of Visits 17    Date for PT Re-Evaluation 06/25/22    Authorization Type [employer] Medicare    Authorization Time Period 04/30/22 to 06/25/22    Progress Note Due on Visit 10    PT Start Time 1435    PT Stop Time 1515    PT Time Calculation (min) 40 min    Activity Tolerance Patient tolerated treatment well    Behavior During Therapy [facility] for tasks assessed/performed             Past Medical History:  Diagnosis Date   ASTHMA    BREAST CANCER, HX OF 11/1979   CKD (chronic kidney disease) stage 3, GFR 30-59 ml/min ([place]) 03/05/2020   GERD    GI bleed 11/92, 11/93, 11/94   GI Bleed   Irritable bowel syndrome    MIGRAINE HEADACHE    Symptomatic anemia 08/24/2018   Past Surgical History:  Procedure Laterality Date   ABDOMINAL HYSTERECTOMY  11/1979   BREAST SURGERY     ESOPHAGOGASTRODUODENOSCOPY N/A 08/26/2018   Procedure: ESOPHAGOGASTRODUODENOSCOPY (EGD);  Surgeon: Doran Stabler, MD;  Location: [place];  Service: Gastroenterology;  Laterality: N/A;   Left wrist  1963   MASTECTOMY  11/1979   Bilaterally & implants   OTHER SURGICAL HISTORY Bilateral    removal of breast implants   [place]   Patient Active Problem List   Diagnosis Date Noted   Right sided sciatica 03/31/2022   Right hip pain 03/31/2022   Blood pressure elevated without history of HTN 01/05/2021   Asthmatic bronchitis 12/31/2020   Acute bronchitis 12/28/2020   DNR (do not resuscitate) discussion 08/18/2020   Bilateral impacted cerumen 07/11/2020   Hypothyroidism 03/05/2020   CKD (chronic kidney disease) stage 3, GFR 30-59 ml/min ([facility]) 03/05/2020   Atypical lymphocytic infiltrate of skin 10/30/2019   Hyperglycemia 02/10/2019   Wheezing     Symptomatic anemia 08/24/2018   Syncope 08/24/2018   GI bleed 08/24/2018   Iron deficiency anemia 02/04/2018   Encounter for well adult exam with abnormal findings 02/03/2017   Osteopenia 02/03/2017   Hypertriglyceridemia 08/22/2015   H/O TB skin testing 12/03/2012   Anaphylactic reaction    Thrombocytosis ([place]) 01/19/2011   Migraine headache 08/10/2008   Non-allergic asthma 08/10/2008   GERD 08/10/2008   Irritable bowel syndrome 08/10/2008   Headache(784.0) 08/10/2008   Personal history of breast cancer 08/10/2008     THERAPY DIAG:  Other low back pain  Pain in right hip  Muscle weakness (generalized)  Abnormal posture  Cramp and spasm   PCP: Cathlean Cower MD    REFERRING PROVIDER: Mcarthur Rossetti, MD    REFERRING DIAG: [phone] (ICD-10-CM) - Pain in right leg M54.41,G89.29 (ICD-10-CM) - Chronic right-sided low back pain with right-sided sciatica    Rationale for Evaluation and Treatment Rehabilitation   THERAPY DIAG:  Other low back pain   Pain in right hip   Muscle weakness (generalized)   Abnormal posture   Cramp and spasm   ONSET DATE: 04/16/2022    SUBJECTIVE:  SUBJECTIVE STATEMENT: She stays sciatica is flared up today down her Rt leg to her knee.    PERTINENT HISTORY:    "Plan: I went over the x-ray findings of her lumbar spine.  She would definitely benefit from outpatient physical therapy to see if there is any modalities that can help decrease her low back pain to the right side.  There is definitely a spinal thesis and pain in the posterior elements.  Any modalities per the therapist discretion will be helpful.  We will then see her back in 4 weeks.  My next step would be considering sending her for an MRI then help target where she may need an injection  in her back.  All question concerns were answered and addressed.  She agrees with this treatment plan.   Follow-Up Instructions: Return in about 4 weeks (around 05/14/2022). "     PAIN:  Are you having pain? Yes: NPRS scale: 1-2/10 now and baseline; at worst 8/10 and can take up to 90 minutes to calm down but there are also times where it doesn't calm down Pain location: right side of low back going down right leg  Pain description: goes from being sharp stab to a burning, is constant most of the time  Aggravating factors: sitting, walking can increase pain as well, being in one spot for too long  Relieving factors: meds like Excedrin and mm relaxers      PRECAUTIONS: None   WEIGHT BEARING RESTRICTIONS No   FALLS:  Has patient fallen in last 6 months? No   LIVING ENVIRONMENT: Lives with: lives with their spouse Lives in: House/apartment Stairs: 4 STE B rails, 0 steps in the home  Has following equipment at home: Single point cane, Environmental consultant - 2 wheeled, Crutches, and shower chair   OCCUPATION: retired    PLOF: Independent and Independent with basic ADLs   PATIENT GOALS manage the pain, get comfortable with walking longer distances (at least 1/2 mile)     OBJECTIVE:    DIAGNOSTIC FINDINGS:  IMPRESSION: No acute fracture or dislocation   PATIENT SURVEYS:  FOTO 45   SCREENING FOR RED FLAGS: Bowel or bladder incontinence: No Spinal tumors: No Cauda equina syndrome: No Compression fracture: No Abdominal aneurysm: No   COGNITION:           Overall cognitive status: Within functional limits for tasks assessed                          SENSATION: Not tested   MUSCLE LENGTH: Quads mild limitation, hip flexors moderate limitation, B piriformis moderate limitation, B hamstrings mild limitation, R adductors very tight but L OK    POSTURE: rounded shoulders, forward head, decreased lumbar lordosis, decreased thoracic kyphosis, and flexed trunk  LLD (-)   PALPATION: Tight  lateral thigh,  R TFL very TTP, R glute max/med very tight and TTP, R lumbar paraspinals tight and TTP but reports this is her baseline    LUMBAR ROM:    Active  A/PROM  eval  Flexion WNL  Extension WNL  Right lateral flexion WNL   Left lateral flexion WNL  Right rotation    Left rotation     (Blank rows = not tested)   LOWER EXTREMITY ROM:      Active  Right eval Left eval  Hip flexion      Hip extension      Hip abduction      Hip adduction  Hip internal rotation      Hip external rotation      Knee flexion      Knee extension      Ankle dorsiflexion      Ankle plantarflexion      Ankle inversion      Ankle eversion       (Blank rows = not tested)   LOWER EXTREMITY MMT:     MMT Right eval Left eval  Hip flexion 3/5 3/5  Hip extension      Hip abduction 3/5 3/5  Hip adduction      Hip internal rotation      Hip external rotation      Knee flexion 4/5 4-/5  Knee extension 4+/5 4+/5  Ankle dorsiflexion 5/5 5/5  Ankle plantarflexion      Ankle inversion 5/5 5/5  Ankle eversion 5/5 5/5   (Blank rows = not tested)   LUMBAR SPECIAL TESTS:  Straight leg raise test: Negative   FUNCTIONAL TESTS:      GAIT: Distance walked: in clinic distances  Assistive device utilized: None Level of assistance: Complete Independence Comments: trunk flexed/hip flexors tight, + trendelenburg        TODAY'S TREATMENT  05/06/22 -Nu step L5 X 7 min UE/LE  -Seated sciatic nerve glide X10 on Rt -Seated piriformis stretch figure 4 20 sec X 3 bilat -Supine piriformis stretch knee to opposite shoulder 20 sec X 3 bilat -Supine clams green 2X10 bilat -sidelying Rt hip abd SLR X10 -Sidelying Rt reverse clam X 10   -Manual therapy: Rt leg long axis distraction, Rt hip/lumbar IASTM with massage gun  eval Nustep L4 x6 minutes BLEs only    Bridges x10 Figure 4 piriformis stretch x30 seconds R Frog stretch for adductors x30 seconds TrA sets standing 10x3 seconds     PATIENT EDUCATION:  Education details: POC, HEP, exam findings  Person educated: Patient Education method: Explanation, Demonstration, and Handouts Education comprehension: verbalized understanding and returned demonstration     HOME EXERCISE PROGRAM: Access Code: [email or web address] URL: [email or web address] Date: 05/06/2022 Prepared by: Elsie Ra  Exercises - Supine Bridge  - 2 x daily - 7 x weekly - 1 sets - 10 reps - 3 hold - Supine Figure 4 Piriformis Stretch  - 3 x daily - 7 x weekly - 1 sets - 3 reps - 30 hold - Supine Hip Adductor Stretch  - 2 x daily - 7 x weekly - 1 sets - 3 reps - 30 hold - Seated Transversus Abdominis Bracing  - 3 x daily - 7 x weekly - 1 sets - 10-15 reps - 3-5 hold - Seated Slump Nerve Glide  - 2 x daily - 6 x weekly - 1-2 sets - 10 reps - 3 hold - Sidelying Hip Abduction  - 2 x daily - 6 x weekly - 1 sets - 10 reps - Sidelying Reverse Clamshell  - 2 x daily - 6 x weekly - 1-2 sets - 10 reps - Clamshell  - 2 x daily - 6 x weekly - 1-2 sets - 10 reps   ASSESSMENT:   CLINICAL IMPRESSION: She had complaints of Rt sided sciatica today but had good overall tolerance to exercise progressions today. I did add sciatic nerve glide and additional hip strengthening into her HEP and provided her with pictures. She showed good understanding. We trialed manual therapy at end of session so see if this helps reduce any of her pain as well.  OBJECTIVE IMPAIRMENTS Abnormal gait, decreased mobility, difficulty walking, decreased ROM, decreased strength, hypomobility, increased fascial restrictions, increased muscle spasms, impaired flexibility, improper body mechanics, postural dysfunction, and pain.    ACTIVITY LIMITATIONS carrying, lifting, standing, squatting, stairs, transfers, reach over head, and locomotion level   PARTICIPATION LIMITATIONS: shopping, community activity, occupation, yard work, and church   PERSONAL FACTORS Age, Behavior pattern,  Past/current experiences, and Time since onset of injury/illness/exacerbation are also affecting patient's functional outcome.    REHAB POTENTIAL: Good   CLINICAL DECISION MAKING: Stable/uncomplicated   EVALUATION COMPLEXITY: Low     GOALS: Goals reviewed with patient? Yes   SHORT TERM GOALS: Target date: 05/28/2022   Will be compliant with appropriate progressive HEP  Baseline: Goal status: INITIAL   2.  Pain to be no more than 5/10 at worst  Baseline:  Goal status: INITIAL   3.  Will show at least a 50% improvement in muscle flexibility in all impaired groups as well as a 50% impairment in mm spasms/trigger points to assist in reducing pain  Baseline:  Goal status: INITIAL   4.  Will have improved awareness of posture and ergonomics  Baseline:  Goal status: INITIAL     LONG TERM GOALS: Target date: 06/25/2022   MMT to improve by at least 1 grade in all weak groups  Baseline:  Goal status: INITIAL   2.  Will be able to tolerate sitting for at least 60 minutes without increase in pain  Baseline:  Goal status: INITIAL   3.  Will be able to tolerate standing and walking for extended periods (at least 60 minutes)without increased pain to improve community access  Baseline:  Goal status: INITIAL   4.  Will be compliant with appropriate gym based vs advanced home exercise program to maintain functional gains/prevent recurrence of pain  Baseline:  Goal status: INITIAL       PLAN: PT FREQUENCY: 2x/week   PT DURATION: 8 weeks   PLANNED INTERVENTIONS: Therapeutic exercises, Therapeutic activity, Neuromuscular re-education, Balance training, Gait training, Patient/Family education, Self Care, Joint mobilization, Stair training, Dry Needling, Electrical stimulation, Spinal mobilization, Cryotherapy, Moist heat, Taping, Traction, Ultrasound, Ionotophoresis '4mg'$ /ml Dexamethasone, Manual therapy, and Re-evaluation.   PLAN FOR NEXT SESSION: how was manual therapy from last  time, consider DN if massage gun was not effective. work on general core and hip strengthening, also muscle flexibility and postural training    Debbe Odea, PT,DPT 05/06/2022, 3:25 PM

## 2022-05-11 ENCOUNTER — Ambulatory Visit: Payer: Medicare Other | Admitting: Physical Therapy

## 2022-05-11 ENCOUNTER — Encounter: Payer: Self-pay | Admitting: Physical Therapy

## 2022-05-11 DIAGNOSIS — M5459 Other low back pain: Secondary | ICD-10-CM | POA: Diagnosis not present

## 2022-05-11 DIAGNOSIS — R293 Abnormal posture: Secondary | ICD-10-CM

## 2022-05-11 DIAGNOSIS — M6281 Muscle weakness (generalized): Secondary | ICD-10-CM

## 2022-05-11 DIAGNOSIS — R252 Cramp and spasm: Secondary | ICD-10-CM | POA: Diagnosis not present

## 2022-05-11 DIAGNOSIS — M25551 Pain in right hip: Secondary | ICD-10-CM | POA: Diagnosis not present

## 2022-05-11 NOTE — Therapy (Signed)
OUTPATIENT PHYSICAL THERAPY TREATMENT NOTE   Patient Name: [name] [name] MRN: [id number] DOB:[birth year], 71 y.o., female Today's Date: 05/11/2022  END OF SESSION:   PT End of Session - 05/11/22 1040     Visit Number 3    Number of Visits 17    Date for PT Re-Evaluation 06/25/22    Authorization Type [employer] Medicare    Authorization Time Period 04/30/22 to 06/25/22    Progress Note Due on Visit 10    PT Start Time 1016    PT Stop Time 1058    PT Time Calculation (min) 42 min    Activity Tolerance Patient tolerated treatment well    Behavior During Therapy [facility] for tasks assessed/performed              Past Medical History:  Diagnosis Date   ASTHMA    BREAST CANCER, HX OF 11/1979   CKD (chronic kidney disease) stage 3, GFR 30-59 ml/min ([place]) 03/05/2020   GERD    GI bleed 11/92, 11/93, 11/94   GI Bleed   Irritable bowel syndrome    MIGRAINE HEADACHE    Symptomatic anemia 08/24/2018   Past Surgical History:  Procedure Laterality Date   ABDOMINAL HYSTERECTOMY  11/1979   BREAST SURGERY     ESOPHAGOGASTRODUODENOSCOPY N/A 08/26/2018   Procedure: ESOPHAGOGASTRODUODENOSCOPY (EGD);  Surgeon: Doran Stabler, MD;  Location: [place];  Service: Gastroenterology;  Laterality: N/A;   Left wrist  1963   MASTECTOMY  11/1979   Bilaterally & implants   OTHER SURGICAL HISTORY Bilateral    removal of breast implants   [place]   Patient Active Problem List   Diagnosis Date Noted   Right sided sciatica 03/31/2022   Right hip pain 03/31/2022   Blood pressure elevated without history of HTN 01/05/2021   Asthmatic bronchitis 12/31/2020   Acute bronchitis 12/28/2020   DNR (do not resuscitate) discussion 08/18/2020   Bilateral impacted cerumen 07/11/2020   Hypothyroidism 03/05/2020   CKD (chronic kidney disease) stage 3, GFR 30-59 ml/min ([facility]) 03/05/2020   Atypical lymphocytic infiltrate of skin 10/30/2019   Hyperglycemia 02/10/2019   Wheezing     Symptomatic anemia 08/24/2018   Syncope 08/24/2018   GI bleed 08/24/2018   Iron deficiency anemia 02/04/2018   Encounter for well adult exam with abnormal findings 02/03/2017   Osteopenia 02/03/2017   Hypertriglyceridemia 08/22/2015   H/O TB skin testing 12/03/2012   Anaphylactic reaction    Thrombocytosis ([place]) 01/19/2011   Migraine headache 08/10/2008   Non-allergic asthma 08/10/2008   GERD 08/10/2008   Irritable bowel syndrome 08/10/2008   Headache(784.0) 08/10/2008   Personal history of breast cancer 08/10/2008     THERAPY DIAG:  Other low back pain  Pain in right hip  Muscle weakness (generalized)  Abnormal posture  Cramp and spasm   PCP: Cathlean Cower MD    REFERRING PROVIDER: Mcarthur Rossetti, MD    REFERRING DIAG: [phone] (ICD-10-CM) - Pain in right leg M54.41,G89.29 (ICD-10-CM) - Chronic right-sided low back pain with right-sided sciatica    Rationale for Evaluation and Treatment Rehabilitation   THERAPY DIAG:  Other low back pain   Pain in right hip   Muscle weakness (generalized)   Abnormal posture   Cramp and spasm   ONSET DATE: 04/16/2022    SUBJECTIVE:  SUBJECTIVE STATEMENT: I had unbelievably high levels of pain after last session, not sure what made my pain so high  and it took until the Saturday after that session to feel better. I've been sitting in front of a computer screen more than usual.     PERTINENT HISTORY:    "Plan: I went over the x-ray findings of her lumbar spine.  She would definitely benefit from outpatient physical therapy to see if there is any modalities that can help decrease her low back pain to the right side.  There is definitely a spinal thesis and pain in the posterior elements.  Any modalities per the therapist discretion  will be helpful.  We will then see her back in 4 weeks.  My next step would be considering sending her for an MRI then help target where she may need an injection in her back.  All question concerns were answered and addressed.  She agrees with this treatment plan.   Follow-Up Instructions: Return in about 4 weeks (around 05/14/2022). "     PAIN:  Are you having pain? Yes: NPRS scale: 1/10 now and baseline; at worst 8/10 and can take up to 90 minutes to calm down but there are also times where it doesn't calm down Pain location: right side of low back going down right leg  Pain description: goes from being sharp stab to a burning, is constant most of the time  Aggravating factors: sitting, walking can increase pain as well, being in one spot for too long  Relieving factors: meds like Excedrin and mm relaxers      PRECAUTIONS: None   WEIGHT BEARING RESTRICTIONS No   FALLS:  Has patient fallen in last 6 months? No   LIVING ENVIRONMENT: Lives with: lives with their spouse Lives in: House/apartment Stairs: 4 STE B rails, 0 steps in the home  Has following equipment at home: Single point cane, Environmental consultant - 2 wheeled, Crutches, and shower chair   OCCUPATION: retired    PLOF: Independent and Independent with basic ADLs   PATIENT GOALS manage the pain, get comfortable with walking longer distances (at least 1/2 mile)     OBJECTIVE:    DIAGNOSTIC FINDINGS:  IMPRESSION: No acute fracture or dislocation   PATIENT SURVEYS:  FOTO 45   SCREENING FOR RED FLAGS: Bowel or bladder incontinence: No Spinal tumors: No Cauda equina syndrome: No Compression fracture: No Abdominal aneurysm: No   COGNITION:           Overall cognitive status: Within functional limits for tasks assessed                          SENSATION: Not tested   MUSCLE LENGTH: Quads mild limitation, hip flexors moderate limitation, B piriformis moderate limitation, B hamstrings mild limitation, R adductors very tight  but L OK    POSTURE: rounded shoulders, forward head, decreased lumbar lordosis, decreased thoracic kyphosis, and flexed trunk  LLD (-)   PALPATION: Tight lateral thigh,  R TFL very TTP, R glute max/med very tight and TTP, R lumbar paraspinals tight and TTP but reports this is her baseline    LUMBAR ROM:    Active  A/PROM  eval  Flexion WNL  Extension WNL  Right lateral flexion WNL   Left lateral flexion WNL  Right rotation    Left rotation     (Blank rows = not tested)   LOWER EXTREMITY ROM:  Active  Right eval Left eval  Hip flexion      Hip extension      Hip abduction      Hip adduction      Hip internal rotation      Hip external rotation      Knee flexion      Knee extension      Ankle dorsiflexion      Ankle plantarflexion      Ankle inversion      Ankle eversion       (Blank rows = not tested)   LOWER EXTREMITY MMT:     MMT Right eval Left eval  Hip flexion 3/5 3/5  Hip extension      Hip abduction 3/5 3/5  Hip adduction      Hip internal rotation      Hip external rotation      Knee flexion 4/5 4-/5  Knee extension 4+/5 4+/5  Ankle dorsiflexion 5/5 5/5  Ankle plantarflexion      Ankle inversion 5/5 5/5  Ankle eversion 5/5 5/5   (Blank rows = not tested)   LUMBAR SPECIAL TESTS:  Straight leg raise test: Negative   FUNCTIONAL TESTS:      GAIT: Distance walked: in clinic distances  Assistive device utilized: None Level of assistance: Complete Independence Comments: trunk flexed/hip flexors tight, + trendelenburg        TODAY'S TREATMENT   05/11/22  Nustep x6 minutes L5 BLEs only (had to drop to L4 for one minute for rest break due to LE fatigue mid-way through time)  Constance Haw with red TB ABD x10 2 second holds Sidelying clams red TB 1x15 B  Prone hip extensions red TB 1x10 B             Walking bridges x10               Piriformis stretch 2x30 seconds B             Hip flexor stretch 2x30 seconds B              3 way hip red  TB x10 B band above knees               Hip hikes 1x10                                   05/06/22 -Nu step L5 X 7 min UE/LE  -Seated sciatic nerve glide X10 on Rt -Seated piriformis stretch figure 4 20 sec X 3 bilat -Supine piriformis stretch knee to opposite shoulder 20 sec X 3 bilat -Supine clams green 2X10 bilat -sidelying Rt hip abd SLR X10 -Sidelying Rt reverse clam X 10   -Manual therapy: Rt leg long axis distraction, Rt hip/lumbar IASTM with massage gun  eval Nustep L4 x6 minutes BLEs only    Bridges x10 Figure 4 piriformis stretch x30 seconds R Frog stretch for adductors x30 seconds TrA sets standing 10x3 seconds    PATIENT EDUCATION:  Education details: POC, HEP, exam findings  Person educated: Patient Education method: Explanation, Demonstration, and Handouts Education comprehension: verbalized understanding and returned demonstration     HOME EXERCISE PROGRAM: Access Code: [email or web address] URL: [email or web address] Date: 05/06/2022 Prepared by: Elsie Ra  Exercises - Supine Bridge  - 2 x daily - 7 x weekly - 1 sets - 10 reps - 3 hold - Supine Figure 4 Piriformis Stretch  -  3 x daily - 7 x weekly - 1 sets - 3 reps - 30 hold - Supine Hip Adductor Stretch  - 2 x daily - 7 x weekly - 1 sets - 3 reps - 30 hold - Seated Transversus Abdominis Bracing  - 3 x daily - 7 x weekly - 1 sets - 10-15 reps - 3-5 hold - Seated Slump Nerve Glide  - 2 x daily - 6 x weekly - 1-2 sets - 10 reps - 3 hold - Sidelying Hip Abduction  - 2 x daily - 6 x weekly - 1 sets - 10 reps - Sidelying Reverse Clamshell  - 2 x daily - 6 x weekly - 1-2 sets - 10 reps - Clamshell  - 2 x daily - 6 x weekly - 1-2 sets - 10 reps   ASSESSMENT:   CLINICAL IMPRESSION:  Magdelena arrives doing OK, had a lot of pain after last session- upon review, I wonder if the massage gun may have aggravated her pain. We avoided this today and focused on gentle strengthening and stretching this morning.  Seemed to tolerate today's session well, hopefully she will not have another pain flare after PT today.    OBJECTIVE IMPAIRMENTS Abnormal gait, decreased mobility, difficulty walking, decreased ROM, decreased strength, hypomobility, increased fascial restrictions, increased muscle spasms, impaired flexibility, improper body mechanics, postural dysfunction, and pain.    ACTIVITY LIMITATIONS carrying, lifting, standing, squatting, stairs, transfers, reach over head, and locomotion level   PARTICIPATION LIMITATIONS: shopping, community activity, occupation, yard work, and church   PERSONAL FACTORS Age, Behavior pattern, Past/current experiences, and Time since onset of injury/illness/exacerbation are also affecting patient's functional outcome.    REHAB POTENTIAL: Good   CLINICAL DECISION MAKING: Stable/uncomplicated   EVALUATION COMPLEXITY: Low     GOALS: Goals reviewed with patient? Yes   SHORT TERM GOALS: Target date: 05/28/2022   Will be compliant with appropriate progressive HEP  Baseline: Goal status: INITIAL   2.  Pain to be no more than 5/10 at worst  Baseline:  Goal status: INITIAL   3.  Will show at least a 50% improvement in muscle flexibility in all impaired groups as well as a 50% impairment in mm spasms/trigger points to assist in reducing pain  Baseline:  Goal status: INITIAL   4.  Will have improved awareness of posture and ergonomics  Baseline:  Goal status: INITIAL     LONG TERM GOALS: Target date: 06/25/2022   MMT to improve by at least 1 grade in all weak groups  Baseline:  Goal status: INITIAL   2.  Will be able to tolerate sitting for at least 60 minutes without increase in pain  Baseline:  Goal status: INITIAL   3.  Will be able to tolerate standing and walking for extended periods (at least 60 minutes)without increased pain to improve community access  Baseline:  Goal status: INITIAL   4.  Will be compliant with appropriate gym based vs  advanced home exercise program to maintain functional gains/prevent recurrence of pain  Baseline:  Goal status: INITIAL       PLAN: PT FREQUENCY: 2x/week   PT DURATION: 8 weeks   PLANNED INTERVENTIONS: Therapeutic exercises, Therapeutic activity, Neuromuscular re-education, Balance training, Gait training, Patient/Family education, Self Care, Joint mobilization, Stair training, Dry Needling, Electrical stimulation, Spinal mobilization, Cryotherapy, Moist heat, Taping, Traction, Ultrasound, Ionotophoresis '4mg'$ /ml Dexamethasone, Manual therapy, and Re-evaluation.   PLAN FOR NEXT SESSION:  work on general core and hip strengthening, also muscle flexibility  and postural training. STM PRN but avoid massage gun- may have flared up pain     Elliannah Wayment U PT DPT PN2  05/11/2022, 10:58 AM

## 2022-05-13 ENCOUNTER — Encounter: Payer: Medicare Other | Admitting: Physical Therapy

## 2022-05-14 ENCOUNTER — Encounter: Payer: Self-pay | Admitting: Orthopaedic Surgery

## 2022-05-14 ENCOUNTER — Ambulatory Visit: Payer: Medicare Other | Admitting: Orthopaedic Surgery

## 2022-05-14 DIAGNOSIS — M5441 Lumbago with sciatica, right side: Secondary | ICD-10-CM

## 2022-05-14 DIAGNOSIS — M79604 Pain in right leg: Secondary | ICD-10-CM

## 2022-05-14 DIAGNOSIS — G8929 Other chronic pain: Secondary | ICD-10-CM | POA: Diagnosis not present

## 2022-05-14 NOTE — Addendum Note (Signed)
Addended byLaurann Montana on: 05/14/2022 03:46 PM   Modules accepted: Orders

## 2022-05-14 NOTE — Progress Notes (Signed)
The patient comes in today after having physical therapy for right-sided low back pain.  She still denies any radicular component at all and does feel like physical therapy has helped her although she is still having pain and is transition to a home exercise program.  On exam she has excellent mobility overall.  She has 5 out of 5 strength of her bilateral extremities with normal sensation and a negative straight leg raise.  Her pain is easily reproducible at the lower aspect of her lumbar spine to the right and just at the lower pelvis area.  She is interested in an considering an intervention in the lumbar spine.  I would like to send her to Dr. Ernestina Patches for a right-sided L5/S1 injection based on her clinical exam findings.  I do not feel that she needs a MRI since she is not have any radicular symptoms and her pain is reproducible.  There is a chance this may need to be in the right SI joint instead of the facet joint but I will let Dr. Ernestina Patches take a look and see what he thinks as well.  She agrees with this treatment plan.

## 2022-05-19 ENCOUNTER — Encounter: Payer: Medicare Other | Admitting: Physical Therapy

## 2022-05-21 ENCOUNTER — Encounter: Payer: Medicare Other | Admitting: Physical Therapy

## 2022-05-26 ENCOUNTER — Encounter: Payer: Medicare Other | Admitting: Physical Therapy

## 2022-05-28 ENCOUNTER — Encounter: Payer: Medicare Other | Admitting: Physical Therapy

## 2022-06-02 ENCOUNTER — Encounter: Payer: Self-pay | Admitting: Physical Medicine and Rehabilitation

## 2022-06-02 ENCOUNTER — Ambulatory Visit: Payer: Medicare Other | Admitting: Physical Medicine and Rehabilitation

## 2022-06-02 VITALS — BP 129/85 | HR 93

## 2022-06-02 DIAGNOSIS — M79604 Pain in right leg: Secondary | ICD-10-CM

## 2022-06-02 DIAGNOSIS — M533 Sacrococcygeal disorders, not elsewhere classified: Secondary | ICD-10-CM

## 2022-06-02 DIAGNOSIS — M7918 Myalgia, other site: Secondary | ICD-10-CM | POA: Diagnosis not present

## 2022-06-02 MED ORDER — DIAZEPAM 5 MG PO TABS: ORAL_TABLET | ORAL | 0 refills | Status: DC
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
## 2022-06-02 NOTE — Progress Notes (Unsigned)
Aching pain down the right leg. Sharpe pain in the posterior right hip area.  Pain comes and goes for a couple months. PT helped some. Walking and sitting long periods makes it worse. Takes 2 excedrin a day, has a rx for a muscle relaxer. Takes this as needed   Numeric Pain Rating Scale and Functional Assessment Average Pain 3   In the last MONTH (on 0-10 scale) has pain interfered with the following?  1. General activity like being  able to carry out your everyday physical activities such as walking, climbing stairs, carrying groceries, or moving a chair?  Rating(8)

## 2022-06-03 ENCOUNTER — Encounter: Payer: Self-pay | Admitting: Physical Medicine and Rehabilitation

## 2022-06-18 ENCOUNTER — Ambulatory Visit: Payer: Medicare Other | Admitting: Physical Medicine and Rehabilitation

## 2022-06-18 ENCOUNTER — Ambulatory Visit: Payer: Self-pay

## 2022-06-18 DIAGNOSIS — M533 Sacrococcygeal disorders, not elsewhere classified: Secondary | ICD-10-CM | POA: Diagnosis not present

## 2022-06-18 NOTE — Progress Notes (Signed)
LAURAANN MISSEY - 71 y.o. female MRN [id number]  Date of birth: Jan 11, 1951  Office Visit Note: Visit Date: 06/18/2022 PCP: Biagio Borg, MD Referred by: Biagio Borg, MD  Subjective: Chief Complaint  Patient presents with   Lower Back - Pain   HPI:  VENETTA KNEE is a 71 y.o. female who comes in today at the request of Barnet Pall, FNP for planned Right anesthetic Sacroiliac joint arthrogram with fluoroscopic guidance.  The patient has failed conservative care including home exercise, medications, time and activity modification.  This injection will be diagnostic and hopefully therapeutic.  Please see requesting physician notes for further details and justification.   Positive Fortin finger sign, Patrick's testing, Gaenslen's  and lateral compression test.     ROS Otherwise per HPI.  Assessment & Plan: Visit Diagnoses:    ICD-10-CM   1. Sacroiliac joint dysfunction of right side  M53.3 Sacroiliac Joint Inj    XR C-ARM NO REPORT      Plan: No additional findings.   Meds & Orders: No orders of the defined types were placed in this encounter.   Orders Placed This Encounter  Procedures   Sacroiliac Joint Inj   XR C-ARM NO REPORT    Follow-up: Return for visit to requesting provider as needed.   Procedures: Sacroiliac Joint Inj on 06/18/2022 8:26 AM Indications: pain and diagnostic evaluation Details: 22 G 3.5 in needle, fluoroscopy-guided posterior approach Medications: 2 mL bupivacaine 0.5 %; 80 mg methylPREDNISolone acetate 80 MG/ML; 40 mg methylPREDNISolone acetate 40 MG/ML Outcome: tolerated well, no immediate complications  Sacroiliac Joint Intra-Articular Injection - Posterior Approach with Fluoroscopic Guidance   Position: PRONE  Additional Comments: Vital signs were monitored before and after the procedure. Patient was prepped and draped in the usual sterile fashion. The correct patient, procedure, and site was verified.   Injection Procedure Details:    Location/Site:  Sacroiliac joint  Needle size: 3.5 in Spinal Needle  Needle type: Spinal  Needle Placement: Intra-articular  Findings:  -Comments: There was excellent flow of contrast producing a partial arthrogram of the sacroiliac joint.   Procedure Details: Starting with a 90 degree vertical and midline orientation the fluoroscope was tilted cranially 20 to 25 degrees and the target area of the inferior most part of the SI joint on the side mentioned above was visualized.  The soft tissues overlying this target were infiltrated with 4 ml. of 1% Lidocaine without Epinephrine. A #22 gauge spinal needle was inserted perpendicular to the fluoroscope table and advanced into the posterior inferior joint space using fluoroscopic guidance.  Position in the joint space was confirmed by obtaining a partial arthrogram using a 2 ml. volume of Isovue-250 contrast agent. After negative aspirate for gross pus or blood, the injectate was delivered to the joint. Radiographs were obtained for documentation purposes.   Additional Comments:   Dressing: Bandaid    Post-procedure details: Patient was observed during the procedure. Post-procedure instructions were reviewed.  Patient left the clinic in stable condition.    There was excellent flow of contrast producing a partial arthrogram of the sacroiliac joint.  Procedure, treatment alternatives, risks and benefits explained, specific risks discussed. Consent was given by the patient. Immediately prior to procedure a time out was called to verify the correct patient, procedure, equipment, support staff and site/side marked as required. Patient was prepped and draped in the usual sterile fashion.          Clinical History: No specialty comments available.  Objective:  VS:  HT:    WT:   BMI:     BP:   HR: bpm  TEMP: ( )  RESP:  Physical Exam   Imaging: No results found.

## 2022-06-18 NOTE — Progress Notes (Signed)
Numeric Pain Rating Scale and Functional Assessment Average Pain 2-4, today it's a 2   In the last MONTH (on 0-10 scale) has pain interfered with the following?  1. General activity like being  able to carry out your everyday physical activities such as walking, climbing stairs, carrying groceries, or moving a chair?  Rating(7.5)   +Driver, -BT, -Dye Allergies.

## 2022-06-29 MED ORDER — METHYLPREDNISOLONE ACETATE 80 MG/ML IJ SUSP
80.0000 mg | INTRAMUSCULAR | Status: AC | PRN
  Administered 2022-06-18: 80 mg via INTRA_ARTICULAR

## 2022-06-29 MED ORDER — BUPIVACAINE HCL 0.5 % IJ SOLN
2.0000 mL | INTRAMUSCULAR | Status: AC | PRN
  Administered 2022-06-18: 2 mL via INTRA_ARTICULAR

## 2022-06-29 MED ORDER — METHYLPREDNISOLONE ACETATE 40 MG/ML IJ SUSP
40.0000 mg | INTRAMUSCULAR | Status: AC | PRN
  Administered 2022-06-18: 40 mg via INTRA_ARTICULAR

## 2022-07-16 ENCOUNTER — Encounter: Payer: Self-pay | Admitting: Physical Therapy

## 2022-07-16 NOTE — Therapy (Signed)
PHYSICAL THERAPY DISCHARGE SUMMARY  Visits from Start of Care: 3  Current functional level related to goals / functional outcomes: Did not return since last scheduled visit, PT POC/recommendations not completed    Remaining deficits: Unable to assess    Education / Equipment: N/A    Patient agrees to discharge. Patient goals were not met. Patient is being discharged due to not returning since the last visit.   [name] [name] U PT DPT PN2  07/16/2022, 8:07 AM

## 2022-08-10 ENCOUNTER — Other Ambulatory Visit: Payer: Self-pay | Admitting: Internal Medicine

## 2022-09-02 ENCOUNTER — Other Ambulatory Visit: Payer: Self-pay | Admitting: Internal Medicine

## 2022-10-01 ENCOUNTER — Ambulatory Visit: Payer: Medicare Other | Admitting: Internal Medicine

## 2022-10-06 ENCOUNTER — Ambulatory Visit (INDEPENDENT_AMBULATORY_CARE_PROVIDER_SITE_OTHER): Payer: Medicare Other | Admitting: Internal Medicine

## 2022-10-06 VITALS — BP 138/86 | HR 89 | Temp 98.4°F | Ht 63.0 in | Wt 157.0 lb

## 2022-10-06 DIAGNOSIS — R739 Hyperglycemia, unspecified: Secondary | ICD-10-CM

## 2022-10-06 DIAGNOSIS — E039 Hypothyroidism, unspecified: Secondary | ICD-10-CM | POA: Diagnosis not present

## 2022-10-06 DIAGNOSIS — E559 Vitamin D deficiency, unspecified: Secondary | ICD-10-CM

## 2022-10-06 DIAGNOSIS — E781 Pure hyperglyceridemia: Secondary | ICD-10-CM | POA: Diagnosis not present

## 2022-10-06 DIAGNOSIS — N1831 Chronic kidney disease, stage 3a: Secondary | ICD-10-CM

## 2022-10-06 DIAGNOSIS — E538 Deficiency of other specified B group vitamins: Secondary | ICD-10-CM | POA: Diagnosis not present

## 2022-10-06 DIAGNOSIS — Z0001 Encounter for general adult medical examination with abnormal findings: Secondary | ICD-10-CM | POA: Diagnosis not present

## 2022-10-06 DIAGNOSIS — D509 Iron deficiency anemia, unspecified: Secondary | ICD-10-CM

## 2022-10-06 LAB — CBC WITH DIFFERENTIAL/PLATELET
Basophils Absolute: 0.1 10*3/uL (ref 0.0–0.1)
Basophils Relative: 1.5 % (ref 0.0–3.0)
Eosinophils Absolute: 0.5 10*3/uL (ref 0.0–0.7)
Eosinophils Relative: 6.6 % — ABNORMAL HIGH (ref 0.0–5.0)
HCT: 42.2 % (ref 36.0–46.0)
Hemoglobin: 13.8 g/dL (ref 12.0–15.0)
Lymphocytes Relative: 29.6 % (ref 12.0–46.0)
Lymphs Abs: 2.4 10*3/uL (ref 0.7–4.0)
MCHC: 32.8 g/dL (ref 30.0–36.0)
MCV: 87 fl (ref 78.0–100.0)
Monocytes Absolute: 0.5 10*3/uL (ref 0.1–1.0)
Monocytes Relative: 6.6 % (ref 3.0–12.0)
Neutro Abs: 4.6 10*3/uL (ref 1.4–7.7)
Neutrophils Relative %: 55.7 % (ref 43.0–77.0)
Platelets: 550 10*3/uL — ABNORMAL HIGH (ref 150.0–400.0)
RBC: 4.85 Mil/uL (ref 3.87–5.11)
RDW: 14.7 % (ref 11.5–15.5)
WBC: 8.2 10*3/uL (ref 4.0–10.5)

## 2022-10-06 LAB — BASIC METABOLIC PANEL
BUN: 13 mg/dL (ref 6–23)
CO2: 28 mEq/L (ref 19–32)
Calcium: 10 mg/dL (ref 8.4–10.5)
Chloride: 103 mEq/L (ref 96–112)
Creatinine, Ser: 1.13 mg/dL (ref 0.40–1.20)
GFR: 48.14 mL/min — ABNORMAL LOW (ref >60.00)
Glucose, Bld: 103 mg/dL — ABNORMAL HIGH (ref 70–99)
Potassium: 4.2 mEq/L (ref 3.5–5.1)
Sodium: 140 mEq/L (ref 135–145)

## 2022-10-06 LAB — TSH: TSH: 4.69 u[IU]/mL (ref 0.35–5.50)

## 2022-10-06 LAB — LIPID PANEL
Cholesterol: 231 mg/dL — ABNORMAL HIGH (ref 0–200)
HDL: 64.5 mg/dL (ref >39.00)
NonHDL: 166.45
Total CHOL/HDL Ratio: 4
Triglycerides: 299 mg/dL — ABNORMAL HIGH (ref 0.0–149.0)
VLDL: 59.8 mg/dL — ABNORMAL HIGH (ref 0.0–40.0)

## 2022-10-06 LAB — LDL CHOLESTEROL, DIRECT: Direct LDL: 59 mg/dL

## 2022-10-06 LAB — HEPATIC FUNCTION PANEL
ALT: 15 U/L (ref 0–35)
AST: 20 U/L (ref 0–37)
Albumin: 4.4 g/dL (ref 3.5–5.2)
Alkaline Phosphatase: 68 U/L (ref 39–117)
Bilirubin, Direct: 0.1 mg/dL (ref 0.0–0.3)
Total Bilirubin: 0.3 mg/dL (ref 0.2–1.2)
Total Protein: 7.5 g/dL (ref 6.0–8.3)

## 2022-10-06 LAB — HEMOGLOBIN A1C: Hgb A1c MFr Bld: 6.2 % (ref 4.6–6.5)

## 2022-10-06 LAB — FERRITIN: Ferritin: 19.4 ng/mL (ref 10.0–291.0)

## 2022-10-06 LAB — IBC PANEL
Iron: 78 ug/dL (ref 42–145)
Saturation Ratios: 16.5 % — ABNORMAL LOW (ref 20.0–50.0)
TIBC: 471.8 ug/dL — ABNORMAL HIGH (ref 250.0–450.0)
Transferrin: 337 mg/dL (ref 212.0–360.0)

## 2022-10-06 LAB — VITAMIN B12: Vitamin B-12: >1500 pg/mL — ABNORMAL HIGH (ref 211–911)

## 2022-10-06 LAB — VITAMIN D 25 HYDROXY (VIT D DEFICIENCY, FRACTURES): VITD: 44.12 ng/mL (ref 30.00–100.00)

## 2022-10-06 NOTE — Patient Instructions (Signed)
Please continue all other medications as before, and refills have been done if requested.  Please have the pharmacy call with any other refills you may need.  Please continue your efforts at being more active, low cholesterol diet, and weight control.  You are otherwise up to date with prevention measures today.  Please keep your appointments with your specialists as you may have planned  Please call if you change your mind about the Cardiac CT score  Please go to the LAB at the blood drawing area for the tests to be done  You will be contacted by phone if any changes need to be made immediately.  Otherwise, you will receive a letter about your results with an explanation, but please check with MyChart first.  Please remember to sign up for MyChart if you have not done so, as this will be important to you in the future with finding out test results, communicating by private email, and scheduling acute appointments online when needed.  Please make an Appointment to return in 6 months, or sooner if needed

## 2022-10-06 NOTE — Progress Notes (Unsigned)
Patient ID: [name] [name], female   DOB: 11-11-1950, 72 y.o.   MRN: [id number]         Chief Complaint:: wellness exam and ckd, hyperglycemia, hld, hypothyroidism, iron def anemia       HPI:  [name] [name] is a 72 y.o. female here for wellness exam; for shiingrix and pneumovax at pharmacy, o/w up to date                        Also Pt denies chest pain, increased sob or doe, wheezing, orthopnea, PND, increased LE swelling, palpitations, dizziness or syncope.   Pt denies polydipsia, polyuria, or new focal neuro s/s.    Pt denies fever, wt loss, night sweats, loss of appetite, or other constitutional symptoms  Decliens CT cardiac score.  No recent overt bleeding.   Wt Readings from Last 3 Encounters:  10/06/22 157 lb (71.2 kg)  03/31/22 157 lb (71.2 kg)  09/17/21 157 lb (71.2 kg)   BP Readings from Last 3 Encounters:  10/06/22 138/86  06/02/22 129/85  03/31/22 126/74   Immunization History  Administered Date(s) Administered   Influenza Whole 09/14/2006   PFIZER(Purple Top)SARS-COV-2 Vaccination 06/04/2020   Td 09/15/2007  There are no preventive care reminders to display for this patient.    Past Medical History:  Diagnosis Date   ASTHMA    BREAST CANCER, HX OF 11/1979   CKD (chronic kidney disease) stage 3, GFR 30-59 ml/min ([place]) 03/05/2020   GERD    GI bleed 11/92, 11/93, 11/94   GI Bleed   Irritable bowel syndrome    MIGRAINE HEADACHE    Symptomatic anemia 08/24/2018   Past Surgical History:  Procedure Laterality Date   ABDOMINAL HYSTERECTOMY  11/1979   BREAST SURGERY     ESOPHAGOGASTRODUODENOSCOPY N/A 08/26/2018   Procedure: ESOPHAGOGASTRODUODENOSCOPY (EGD);  Surgeon: Doran Stabler, MD;  Location: [facility];  Service: Gastroenterology;  Laterality: N/A;   Left wrist  1963   MASTECTOMY  11/1979   Bilaterally & implants   OTHER SURGICAL HISTORY Bilateral    removal of breast implants   [place]    reports that she has never  smoked. She has never used smokeless tobacco. She reports current alcohol use. She reports that she does not use drugs. family history includes Breast cancer in her mother; Colon polyps (age of onset: 33) in her sister; Coronary artery disease in her mother; Coronary artery disease (age of onset: 93) in her father; Endometriosis in her sister; Heart disease in her brother; Hyperlipidemia in her brother; Hypertension in her brother; Irritable bowel syndrome in her sister; Uterine cancer in her mother. Allergies  Allergen Reactions   Albuterol Anaphylaxis    Pt states she has tolerated levalbuterol   Albuterol Sulfate Anaphylaxis    Pt states she has tolerated levalbuterol   Amoxicillin-Pot Clavulanate Anaphylaxis    Lips swell    Broccoli [Brassica Oleracea] Anaphylaxis   Covid-19 (Mrna) Vaccine Therapist, music) [Covid-19 (Mrna) Vaccine] Anaphylaxis   Drug Ingredient [Acetylcysteine] Shortness Of Breath   Epipen [Epinephrine] Other (See Comments)    Allergic to preservatives in Epipen with caution!!!   Fluzone Anaphylaxis   Influenza Vaccines Anaphylaxis   Morphine Anaphylaxis   Penicillin G Anaphylaxis    All cillins All cillins   Povidone-Iodine Anaphylaxis and Hives   Sulfonamide Derivatives Anaphylaxis   Atrovent [Ipratropium] Other (See Comments)    Has sulfa in preservative  Ciprofloxacin Itching    Itchy palms   Hyoscyamine Sulfate Hives   Latex Itching   Meperidine Hcl Nausea And Vomiting    REACTION: N\T\V   Other Other (See Comments) and Swelling    Mucomist-unable to breathe Arm swelling with red streaks   Pneumococcal Vaccines Other (See Comments)    Pt declines   Ppd [Tuberculin Purified Protein Derivative] Swelling and Other (See Comments)    Arm swelling with red streaks   Reglan [Metoclopramide] Other (See Comments)   Zostavax [Zoster Vaccine Live] Other (See Comments)    Pt declines   Pepcid [Famotidine] Nausea And Vomiting, Palpitations and Other (See Comments)     Due to sulfide preservative   Current Outpatient Medications on File Prior to Visit  Medication Sig Dispense Refill   Ascorbic Acid (VITAMIN C) 1000 MG tablet Take 1,000 mg by mouth daily.     aspirin-acetaminophen-caffeine (EXCEDRIN MIGRAINE) 250-250-65 MG per tablet Take 1 tablet by mouth every 6 (six) hours as needed. For migraine     Cyanocobalamin (VITAMIN B 12 PO) Take 1 tablet by mouth daily.     cyclobenzaprine (FLEXERIL) 10 MG tablet Take 1 tablet by mouth 3 times daily. Annual appt due in May must see provider for future refills 60 tablet 2   diazepam (VALIUM) 5 MG tablet Take one tablet by mouth with food one hour prior to procedure. May repeat 30 minutes prior if needed. 2 tablet 0   EPINEPHrine 0.3 mg/0.3 mL IJ SOAJ injection As directed for severe allergic reaction (Patient taking differently: Inject 0.3 mg into the muscle once as needed for anaphylaxis.) 2 each 12   gabapentin (NEURONTIN) 100 MG capsule Take 1 capsule (100 mg total) by mouth 3 (three) times daily. 90 capsule 5   levalbuterol (XOPENEX HFA) 45 MCG/ACT inhaler Inhale 1-2 puffs into the lungs every 6 (six) hours as needed. For shortness of breath 15 g 12   levalbuterol (XOPENEX) 1.25 MG/3ML nebulizer solution Take 1.25 mg by nebulization every 4 (four) hours as needed for wheezing. 72 mL 12   Melatonin 10 MG CAPS Take 10 mg by mouth at bedtime as needed (sleep).      mometasone-formoterol (DULERA) 200-5 MCG/ACT AERO Inhale 2 puffs into the lungs 2 (two) times daily. Rinse mouth 1 each 5   pantoprazole (PROTONIX) 40 MG tablet Take 1 tablet (40 mg total) by mouth daily. Annual appt due in March must see provider for future refills 90 tablet 0   triamcinolone ointment (KENALOG) 0.1 % Apply topically.     vitamin E 400 UNIT capsule Take 400 Units by mouth daily.     zolpidem (AMBIEN) 10 MG tablet TAKE 1 TABLET BY MOUTH EVERY DAY 90 tablet 1   diphenhydrAMINE (BENADRYL) 25 MG tablet Take 1 tablet (25 mg total) by mouth every  6 (six) hours as needed for up to 5 days. 20 tablet 0   No current facility-administered medications on file prior to visit.        ROS:  All others reviewed and negative.  Objective        PE:  BP 138/86 (BP Location: Left Arm, Patient Position: Sitting, Cuff Size: Normal)   Pulse 89   Temp 98.4 F (36.9 C) (Oral)   Ht '5\' 3"'$  (1.6 m)   Wt 157 lb (71.2 kg)   SpO2 96%   BMI 27.81 kg/m                 Constitutional: Pt appears  in NAD               HENT: Head: NCAT.                Right Ear: External ear normal.                 Left Ear: External ear normal.                Eyes: . Pupils are equal, round, and reactive to light. Conjunctivae and EOM are normal               Nose: without d/c or deformity               Neck: Neck supple. Gross normal ROM               Cardiovascular: Normal rate and regular rhythm.                 Pulmonary/Chest: Effort normal and breath sounds without rales or wheezing.                Abd:  Soft, NT, ND, + BS, no organomegaly               Neurological: Pt is alert. At baseline orientation, motor grossly intact               Skin: Skin is warm. No rashes, no other new lesions, LE edema - none               Psychiatric: Pt behavior is normal without agitation   Micro: none  Cardiac tracings I have personally interpreted today:  none  Pertinent Radiological findings (summarize): none   Lab Results  Component Value Date   WBC 8.2 10/06/2022   HGB 13.8 10/06/2022   HCT 42.2 10/06/2022   PLT 550.0 (H) 10/06/2022   GLUCOSE 103 (H) 10/06/2022   CHOL 231 (H) 10/06/2022   TRIG 299.0 (H) 10/06/2022   HDL 64.50 10/06/2022   LDLDIRECT 59.0 10/06/2022   LDLCALC 133 (H) 02/03/2017   ALT 15 10/06/2022   AST 20 10/06/2022   NA 140 10/06/2022   K 4.2 10/06/2022   CL 103 10/06/2022   CREATININE 1.13 10/06/2022   BUN 13 10/06/2022   CO2 28 10/06/2022   TSH 4.69 10/06/2022   INR 1.0 08/24/2018   HGBA1C 6.2 10/06/2022   Assessment/Plan:  [name] [name] is a 72 y.o. White or Caucasian [1] female with  has a past medical history of ASTHMA, BREAST CANCER, HX OF (11/1979), CKD (chronic kidney disease) stage 3, GFR 30-59 ml/min ([facility]) (03/05/2020), GERD, GI bleed (11/92, 11/93, 11/94), Irritable bowel syndrome, MIGRAINE HEADACHE, and Symptomatic anemia (08/24/2018).  Encounter for well adult exam with abnormal findings Age and sex appropriate education and counseling updated with regular exercise and diet Referrals for preventative services - none needed Immunizations addressed - for shingrix and pneumovax at the pharmacy Smoking counseling  - none needed Evidence for depression or other mood disorder - none significant Most recent labs reviewed. I have personally reviewed and have noted: 1) the patient's medical and social history 2) The patient's current medications and supplements 3) The patient's height, weight, and BMI have been recorded in the chart   CKD (chronic kidney disease) stage 3, GFR 30-59 ml/min Lab Results  Component Value Date   CREATININE 1.13 10/06/2022   Stable overall, cont to avoid nephrotoxins   Hyperglycemia Lab Results  Component Value Date  HGBA1C 6.2 10/06/2022   Stable, pt to continue current medical treatment  - diet, wt control   Hypertriglyceridemia Lab Results  Component Value Date   LDLCALC 133 (H) 02/03/2017   Uncontrolled, , pt to continue lower fat low chol diet,  decliens statin   Hypothyroidism Lab Results  Component Value Date   TSH 4.69 10/06/2022   Stable, pt to continue as is, not currently requiring levothyroxine  Iron deficiency anemia No recent overt bleeding or bruising  - for iron level with labs  Followup: Return in about 6 months (around 04/06/2023).  Cathlean Cower, MD 10/07/2022 9:45 PM [facility]

## 2022-10-07 ENCOUNTER — Encounter: Payer: Self-pay | Admitting: Internal Medicine

## 2022-10-07 NOTE — Assessment & Plan Note (Signed)
No recent overt bleeding or bruising  - for iron level with labs

## 2022-10-07 NOTE — Assessment & Plan Note (Signed)
Lab Results  Component Value Date   CREATININE 1.13 10/06/2022   Stable overall, cont to avoid nephrotoxins

## 2022-10-07 NOTE — Assessment & Plan Note (Signed)
Lab Results  Component Value Date   HGBA1C 6.2 10/06/2022   Stable, pt to continue current medical treatment  - diet, wt control  

## 2022-10-07 NOTE — Assessment & Plan Note (Signed)
Lab Results  Component Value Date   LDLCALC 133 (H) 02/03/2017   Uncontrolled, , pt to continue lower fat low chol diet,  decliens statin

## 2022-10-07 NOTE — Assessment & Plan Note (Signed)
Age and sex appropriate education and counseling updated with regular exercise and diet Referrals for preventative services - none needed Immunizations addressed - for shingrix and pneumovax at the pharmacy Smoking counseling  - none needed Evidence for depression or other mood disorder - none significant Most recent labs reviewed. I have personally reviewed and have noted: 1) the patient's medical and social history 2) The patient's current medications and supplements 3) The patient's height, weight, and BMI have been recorded in the chart

## 2022-10-07 NOTE — Assessment & Plan Note (Signed)
Lab Results  Component Value Date   TSH 4.69 10/06/2022   Stable, pt to continue as is, not currently requiring levothyroxine

## 2022-10-14 ENCOUNTER — Other Ambulatory Visit: Payer: Self-pay

## 2022-10-14 MED ORDER — TRIAMCINOLONE ACETONIDE 0.1 % EX OINT: TOPICAL_OINTMENT | Freq: Two times a day (BID) | CUTANEOUS | 0 refills | Status: DC

## 2022-11-18 ENCOUNTER — Ambulatory Visit (INDEPENDENT_AMBULATORY_CARE_PROVIDER_SITE_OTHER): Payer: Medicare Other | Admitting: Internal Medicine

## 2022-11-18 VITALS — BP 126/78 | HR 96 | Temp 98.1°F | Ht 63.0 in | Wt 156.1 lb

## 2022-11-18 DIAGNOSIS — M545 Low back pain, unspecified: Secondary | ICD-10-CM | POA: Diagnosis not present

## 2022-11-18 DIAGNOSIS — R739 Hyperglycemia, unspecified: Secondary | ICD-10-CM

## 2022-11-18 DIAGNOSIS — R030 Elevated blood-pressure reading, without diagnosis of hypertension: Secondary | ICD-10-CM | POA: Diagnosis not present

## 2022-11-18 MED ORDER — KETOROLAC TROMETHAMINE 30 MG/ML IJ SOLN
30.0000 mg | Freq: Once | INTRAMUSCULAR | Status: AC
  Administered 2022-11-18: 30 mg via INTRAVENOUS

## 2022-11-18 MED ORDER — DIAZEPAM 10 MG PO TABS: ORAL_TABLET | ORAL | 1 refills | Status: DC

## 2022-11-18 MED ORDER — TRAMADOL HCL 50 MG PO TABS: 50.0000 mg | ORAL_TABLET | Freq: Four times a day (QID) | ORAL | 0 refills | Status: DC | PRN

## 2022-11-18 NOTE — Patient Instructions (Addendum)
You had the Toradol 30 mg pain shot today  Please take all new medication as prescribed - the valium as needed, and the tramadol as needed for pain  Please continue all other medications as before, and refills have been done if requested.  Please have the pharmacy call with any other refills you may need.  Please continue your efforts at being more active, low cholesterol diet, and weight control.  Please keep your appointments with your specialists as you may have planned

## 2022-11-18 NOTE — Progress Notes (Unsigned)
Patient ID: [name] [name], female   DOB: 02-09-1951, 72 y.o.   MRN: [id number]        Chief Complaint: follow up low back pain, elevated bp, hyperglycemia       HPI:  [name] [name] is a 72 y.o. female here with c/o 3 days acute onset severe constant low back pain, no clear inciting event or unusual physical activity, and Pt denies bowel or bladder change, fever, wt loss,  worsening LE pain/numbness/weakness, gait change or falls except she cannot stand up straight with walking.  No prior signficant hx of back issues.  Pt denies chest pain, increased sob or doe, wheezing, orthopnea, PND, increased LE swelling, palpitations, dizziness or syncope.   Pt denies polydipsia, polyuria, or new focal neuro s/s.    Pt denies fever, wt loss, night sweats, loss of appetite, or other constitutional symptoms         Wt Readings from Last 3 Encounters:  11/18/22 156 lb 2 oz (70.8 kg)  10/06/22 157 lb (71.2 kg)  03/31/22 157 lb (71.2 kg)   BP Readings from Last 3 Encounters:  11/18/22 126/78  10/06/22 138/86  06/02/22 129/85         Past Medical History:  Diagnosis Date   ASTHMA    BREAST CANCER, HX OF 11/1979   CKD (chronic kidney disease) stage 3, GFR 30-59 ml/min ([place]) 03/05/2020   GERD    GI bleed 11/92, 11/93, 11/94   GI Bleed   Irritable bowel syndrome    MIGRAINE HEADACHE    Symptomatic anemia 08/24/2018   Past Surgical History:  Procedure Laterality Date   ABDOMINAL HYSTERECTOMY  11/1979   BREAST SURGERY     ESOPHAGOGASTRODUODENOSCOPY N/A 08/26/2018   Procedure: ESOPHAGOGASTRODUODENOSCOPY (EGD);  Surgeon: Doran Stabler, MD;  Location: [facility];  Service: Gastroenterology;  Laterality: N/A;   Left wrist  1963   MASTECTOMY  11/1979   Bilaterally & implants   OTHER SURGICAL HISTORY Bilateral    removal of breast implants   [place]    reports that she has never smoked. She has never used smokeless tobacco. She reports current alcohol use. She  reports that she does not use drugs. family history includes Breast cancer in her mother; Colon polyps (age of onset: 1) in her sister; Coronary artery disease in her mother; Coronary artery disease (age of onset: 73) in her father; Endometriosis in her sister; Heart disease in her brother; Hyperlipidemia in her brother; Hypertension in her brother; Irritable bowel syndrome in her sister; Uterine cancer in her mother. Allergies  Allergen Reactions   Albuterol Anaphylaxis    Pt states she has tolerated levalbuterol   Albuterol Sulfate Anaphylaxis    Pt states she has tolerated levalbuterol   Amoxicillin-Pot Clavulanate Anaphylaxis    Lips swell    Broccoli [Brassica Oleracea] Anaphylaxis   Covid-19 (Mrna) Vaccine Therapist, music) [Covid-19 (Mrna) Vaccine] Anaphylaxis   Drug Ingredient [Acetylcysteine] Shortness Of Breath   Epipen [Epinephrine] Other (See Comments)    Allergic to preservatives in Epipen with caution!!!   Fluzone Anaphylaxis   Influenza Vaccines Anaphylaxis   Morphine Anaphylaxis   Penicillin G Anaphylaxis    All cillins All cillins   Povidone-Iodine Anaphylaxis and Hives   Sulfonamide Derivatives Anaphylaxis   Atrovent [Ipratropium] Other (See Comments)    Has sulfa in preservative   Ciprofloxacin Itching    Itchy palms   Hyoscyamine Sulfate Hives   Latex Itching  Meperidine Hcl Nausea And Vomiting    REACTION: N\T\V   Other Other (See Comments) and Swelling    Mucomist-unable to breathe Arm swelling with red streaks   Pneumococcal Vaccines Other (See Comments)    Pt declines   Ppd [Tuberculin Purified Protein Derivative] Swelling and Other (See Comments)    Arm swelling with red streaks   Reglan [Metoclopramide] Other (See Comments)   Zostavax [Zoster Vaccine Live] Other (See Comments)    Pt declines   Pepcid [Famotidine] Nausea And Vomiting, Palpitations and Other (See Comments)    Due to sulfide preservative   Current Outpatient Medications on File Prior to  Visit  Medication Sig Dispense Refill   Ascorbic Acid (VITAMIN C) 1000 MG tablet Take 1,000 mg by mouth daily.     aspirin-acetaminophen-caffeine (EXCEDRIN MIGRAINE) 250-250-65 MG per tablet Take 1 tablet by mouth every 6 (six) hours as needed. For migraine     Cyanocobalamin (VITAMIN B 12 PO) Take 1 tablet by mouth daily.     cyclobenzaprine (FLEXERIL) 10 MG tablet Take 1 tablet by mouth 3 times daily. Annual appt due in May must see provider for future refills 60 tablet 2   EPINEPHrine 0.3 mg/0.3 mL IJ SOAJ injection As directed for severe allergic reaction (Patient taking differently: Inject 0.3 mg into the muscle once as needed for anaphylaxis.) 2 each 12   gabapentin (NEURONTIN) 100 MG capsule Take 1 capsule (100 mg total) by mouth 3 (three) times daily. 90 capsule 5   levalbuterol (XOPENEX HFA) 45 MCG/ACT inhaler Inhale 1-2 puffs into the lungs every 6 (six) hours as needed. For shortness of breath 15 g 12   levalbuterol (XOPENEX) 1.25 MG/3ML nebulizer solution Take 1.25 mg by nebulization every 4 (four) hours as needed for wheezing. 72 mL 12   Melatonin 10 MG CAPS Take 10 mg by mouth at bedtime as needed (sleep).      mometasone-formoterol (DULERA) 200-5 MCG/ACT AERO Inhale 2 puffs into the lungs 2 (two) times daily. Rinse mouth 1 each 5   pantoprazole (PROTONIX) 40 MG tablet Take 1 tablet (40 mg total) by mouth daily. Annual appt due in March must see provider for future refills 90 tablet 0   triamcinolone ointment (KENALOG) 0.1 % Apply topically 2 (two) times daily. 30 g 0   vitamin E 400 UNIT capsule Take 400 Units by mouth daily.     zolpidem (AMBIEN) 10 MG tablet TAKE 1 TABLET BY MOUTH EVERY DAY 90 tablet 1   diphenhydrAMINE (BENADRYL) 25 MG tablet Take 1 tablet (25 mg total) by mouth every 6 (six) hours as needed for up to 5 days. 20 tablet 0   No current facility-administered medications on file prior to visit.        ROS:  All others reviewed and negative.  Objective        PE:   BP 126/78   Pulse 96   Temp 98.1 F (36.7 C) (Temporal)   Ht '5\' 3"'$  (1.6 m)   Wt 156 lb 2 oz (70.8 kg)   SpO2 95%   BMI 27.66 kg/m                 Constitutional: Pt appears in NAD               HENT: Head: NCAT.                Right Ear: External ear normal.  Left Ear: External ear normal.                Eyes: . Pupils are equal, round, and reactive to light. Conjunctivae and EOM are normal               Nose: without d/c or deformity               Neck: Neck supple. Gross normal ROM               Cardiovascular: Normal rate and regular rhythm.                 Pulmonary/Chest: Effort normal and breath sounds without rales or wheezing.                Abd:  Soft, NT, ND, + BS, no organomegaly               Neurological: Pt is alert. At baseline orientation, motor grossly intact; and bilateral lumbar paravertebral with severe spasm tenderness               Skin: Skin is warm. No rashes, no other new lesions, LE edema - none               Psychiatric: Pt behavior is normal without agitation   Micro: none  Cardiac tracings I have personally interpreted today:  none  Pertinent Radiological findings (summarize): none   Lab Results  Component Value Date   WBC 8.2 10/06/2022   HGB 13.8 10/06/2022   HCT 42.2 10/06/2022   PLT 550.0 (H) 10/06/2022   GLUCOSE 103 (H) 10/06/2022   CHOL 231 (H) 10/06/2022   TRIG 299.0 (H) 10/06/2022   HDL 64.50 10/06/2022   LDLDIRECT 59.0 10/06/2022   LDLCALC 133 (H) 02/03/2017   ALT 15 10/06/2022   AST 20 10/06/2022   NA 140 10/06/2022   K 4.2 10/06/2022   CL 103 10/06/2022   CREATININE 1.13 10/06/2022   BUN 13 10/06/2022   CO2 28 10/06/2022   TSH 4.69 10/06/2022   INR 1.0 08/24/2018   HGBA1C 6.2 10/06/2022   Assessment/Plan:  [name] [name] is a 72 y.o. White or Caucasian [1] female with  has a past medical history of ASTHMA, BREAST CANCER, HX OF (11/1979), CKD (chronic kidney disease) stage 3, GFR 30-59 ml/min ([facility])  (03/05/2020), GERD, GI bleed (11/92, 11/93, 11/94), Irritable bowel syndrome, MIGRAINE HEADACHE, and Symptomatic anemia (08/24/2018).  Blood pressure elevated without history of HTN BP Readings from Last 3 Encounters:  11/18/22 126/78  10/06/22 138/86  06/02/22 129/85   Stable, pt to continue medical treatment - diet, wt control   Hyperglycemia Lab Results  Component Value Date   HGBA1C 6.2 10/06/2022   Stable, pt to continue current medical treatment  - diet, wt control   Low back pain Severe acute onset c/w mechanical issue of the lower spine such as lumbar DDD, DJD flare, pt states current muscle relaxer not helpful, for toradol 30 mg IM now, ok for valium 10 bid prn (has had high dose experience without sedation or side effect per pt), tramadol 50 qid prn,  to f/u any worsening symptoms or concerns  , consider imaging if not improved  Followup: Return if symptoms worsen or fail to improve.  Cathlean Cower, MD 11/19/2022 5:05 AM [facility]

## 2022-11-19 ENCOUNTER — Encounter: Payer: Self-pay | Admitting: Internal Medicine

## 2022-11-19 DIAGNOSIS — M545 Low back pain, unspecified: Secondary | ICD-10-CM | POA: Insufficient documentation

## 2022-11-19 NOTE — Assessment & Plan Note (Signed)
Lab Results  Component Value Date   HGBA1C 6.2 10/06/2022   Stable, pt to continue current medical treatment  - diet, wt control

## 2022-11-19 NOTE — Assessment & Plan Note (Addendum)
Severe acute onset c/w mechanical issue of the lower spine such as lumbar DDD, DJD flare, pt states current muscle relaxer not helpful, for toradol 30 mg IM now, ok for valium 10 bid prn (has had high dose experience without sedation or side effect per pt), tramadol 50 qid prn,  to f/u any worsening symptoms or concerns  , consider imaging if not improved

## 2022-11-19 NOTE — Assessment & Plan Note (Signed)
BP Readings from Last 3 Encounters:  11/18/22 126/78  10/06/22 138/86  06/02/22 129/85   Stable, pt to continue medical treatment - diet, wt control

## 2022-11-26 ENCOUNTER — Ambulatory Visit (INDEPENDENT_AMBULATORY_CARE_PROVIDER_SITE_OTHER): Payer: Medicare Other

## 2022-11-26 ENCOUNTER — Encounter: Payer: Self-pay | Admitting: Internal Medicine

## 2022-11-26 ENCOUNTER — Ambulatory Visit (INDEPENDENT_AMBULATORY_CARE_PROVIDER_SITE_OTHER): Payer: Medicare Other | Admitting: Internal Medicine

## 2022-11-26 VITALS — BP 130/78 | HR 56 | Temp 97.8°F | Ht 63.0 in | Wt 155.0 lb

## 2022-11-26 DIAGNOSIS — R030 Elevated blood-pressure reading, without diagnosis of hypertension: Secondary | ICD-10-CM

## 2022-11-26 DIAGNOSIS — R739 Hyperglycemia, unspecified: Secondary | ICD-10-CM

## 2022-11-26 DIAGNOSIS — M545 Low back pain, unspecified: Secondary | ICD-10-CM | POA: Diagnosis not present

## 2022-11-26 DIAGNOSIS — M8588 Other specified disorders of bone density and structure, other site: Secondary | ICD-10-CM | POA: Diagnosis not present

## 2022-11-26 MED ORDER — DIAZEPAM 5 MG PO TABS: ORAL_TABLET | ORAL | 1 refills | Status: DC

## 2022-11-26 MED ORDER — KETOROLAC TROMETHAMINE 30 MG/ML IJ SOLN
30.0000 mg | Freq: Once | INTRAMUSCULAR | Status: AC
  Administered 2022-11-26: 30 mg via INTRAVENOUS

## 2022-11-26 MED ORDER — HYDROCODONE-ACETAMINOPHEN 7.5-325 MG PO TABS: 1.0000 | ORAL_TABLET | Freq: Four times a day (QID) | ORAL | 0 refills | Status: DC | PRN

## 2022-11-26 NOTE — Patient Instructions (Signed)
You had the pain shot today  Please take all new medication as prescribed - the lower dose valium as needed, hydrocodone as needed for pain  Please continue all other medications as before, and refills have been done if requested.  Please have the pharmacy call with any other refills you may need.  Please keep your appointments with your specialists as you may have planned  Please go to the XRAY Department in the first floor for the x-ray testing  You will be contacted by phone if any changes need to be made immediately.  Otherwise, you will receive a letter about your results with an explanation, but please check with MyChart first.  Please call on Monday if not improved and you would want the MRI for spine

## 2022-11-26 NOTE — Progress Notes (Signed)
Patient ID: [name], female   DOB: [birth year], 72 y.o.   MRN: [id number]        Chief Complaint: follow up acute low back pain, hyperglycemia, elevated BP without htn       HPI:  [name] [name] is a 72 y.o. female here with c/o acute onset severe constant 1 wk spasm like low back pain, worse on the right lower, radiates to the neck and worse to bend head forward, sitting too long, standing up.  No falls or other inciting events.  Pt denies chest pain, increased sob or doe, wheezing, orthopnea, PND, increased LE swelling, palpitations, dizziness or syncope.   Pt denies polydipsia, polyuria, or new focal neuro s/s.    Pt denies fever, wt loss, night sweats, loss of appetite, or other constitutional symptoms         Wt Readings from Last 3 Encounters:  11/26/22 155 lb (70.3 kg)  11/18/22 156 lb 2 oz (70.8 kg)  10/06/22 157 lb (71.2 kg)   BP Readings from Last 3 Encounters:  11/26/22 130/78  11/18/22 126/78  10/06/22 138/86         Past Medical History:  Diagnosis Date   ASTHMA    BREAST CANCER, HX OF 11/1979   CKD (chronic kidney disease) stage 3, GFR 30-59 ml/min ([place]) 03/05/2020   GERD    GI bleed 11/92, 11/93, 11/94   GI Bleed   Irritable bowel syndrome    MIGRAINE HEADACHE    Symptomatic anemia 08/24/2018   Past Surgical History:  Procedure Laterality Date   ABDOMINAL HYSTERECTOMY  11/1979   BREAST SURGERY     ESOPHAGOGASTRODUODENOSCOPY N/A 08/26/2018   Procedure: ESOPHAGOGASTRODUODENOSCOPY (EGD);  Surgeon: Doran Stabler, MD;  Location: [place];  Service: Gastroenterology;  Laterality: N/A;   Left wrist  1963   MASTECTOMY  11/1979   Bilaterally & implants   OTHER SURGICAL HISTORY Bilateral    removal of breast implants   [place]    reports that she has never smoked. She has never used smokeless tobacco. She reports current alcohol use. She reports that she does not use drugs. family history includes Breast cancer in her  mother; Colon polyps (age of onset: 26) in her sister; Coronary artery disease in her mother; Coronary artery disease (age of onset: 60) in her father; Endometriosis in her sister; Heart disease in her brother; Hyperlipidemia in her brother; Hypertension in her brother; Irritable bowel syndrome in her sister; Uterine cancer in her mother. Allergies  Allergen Reactions   Albuterol Anaphylaxis    Pt states she has tolerated levalbuterol   Albuterol Sulfate Anaphylaxis    Pt states she has tolerated levalbuterol   Amoxicillin-Pot Clavulanate Anaphylaxis    Lips swell    Broccoli [Brassica Oleracea] Anaphylaxis   Covid-19 (Mrna) Vaccine Therapist, music) [Covid-19 (Mrna) Vaccine] Anaphylaxis   Drug Ingredient [Acetylcysteine] Shortness Of Breath   Epipen [Epinephrine] Other (See Comments)    Allergic to preservatives in Epipen with caution!!!   Fluzone Anaphylaxis   Influenza Vaccines Anaphylaxis   Morphine Anaphylaxis   Penicillin G Anaphylaxis    All cillins All cillins   Povidone-Iodine Anaphylaxis and Hives   Sulfonamide Derivatives Anaphylaxis   Atrovent [Ipratropium] Other (See Comments)    Has sulfa in preservative   Ciprofloxacin Itching    Itchy palms   Hyoscyamine Sulfate Hives   Latex Itching   Meperidine Hcl Nausea And Vomiting    REACTION:  N\T\V   Other Other (See Comments) and Swelling    Mucomist-unable to breathe Arm swelling with red streaks   Pneumococcal Vaccines Other (See Comments)    Pt declines   Ppd [Tuberculin Purified Protein Derivative] Swelling and Other (See Comments)    Arm swelling with red streaks   Reglan [Metoclopramide] Other (See Comments)   Zostavax [Zoster Vaccine Live] Other (See Comments)    Pt declines   Pepcid [Famotidine] Nausea And Vomiting, Palpitations and Other (See Comments)    Due to sulfide preservative   Current Outpatient Medications on File Prior to Visit  Medication Sig Dispense Refill   Ascorbic Acid (VITAMIN C) 1000 MG tablet  Take 1,000 mg by mouth daily.     aspirin-acetaminophen-caffeine (EXCEDRIN MIGRAINE) 250-250-65 MG per tablet Take 1 tablet by mouth every 6 (six) hours as needed. For migraine     Cyanocobalamin (VITAMIN B 12 PO) Take 1 tablet by mouth daily.     cyclobenzaprine (FLEXERIL) 10 MG tablet Take 1 tablet by mouth 3 times daily. Annual appt due in May must see provider for future refills 60 tablet 2   diphenhydrAMINE (BENADRYL) 25 MG tablet Take 1 tablet (25 mg total) by mouth every 6 (six) hours as needed for up to 5 days. 20 tablet 0   EPINEPHrine 0.3 mg/0.3 mL IJ SOAJ injection As directed for severe allergic reaction (Patient taking differently: Inject 0.3 mg into the muscle once as needed for anaphylaxis.) 2 each 12   gabapentin (NEURONTIN) 100 MG capsule Take 1 capsule (100 mg total) by mouth 3 (three) times daily. 90 capsule 5   levalbuterol (XOPENEX HFA) 45 MCG/ACT inhaler Inhale 1-2 puffs into the lungs every 6 (six) hours as needed. For shortness of breath 15 g 12   levalbuterol (XOPENEX) 1.25 MG/3ML nebulizer solution Take 1.25 mg by nebulization every 4 (four) hours as needed for wheezing. 72 mL 12   Melatonin 10 MG CAPS Take 10 mg by mouth at bedtime as needed (sleep).      mometasone-formoterol (DULERA) 200-5 MCG/ACT AERO Inhale 2 puffs into the lungs 2 (two) times daily. Rinse mouth 1 each 5   pantoprazole (PROTONIX) 40 MG tablet Take 1 tablet (40 mg total) by mouth daily. Annual appt due in March must see provider for future refills 90 tablet 0   traMADol (ULTRAM) 50 MG tablet Take 1 tablet (50 mg total) by mouth every 6 (six) hours as needed. 30 tablet 0   triamcinolone ointment (KENALOG) 0.1 % Apply topically 2 (two) times daily. 30 g 0   vitamin E 400 UNIT capsule Take 400 Units by mouth daily.     zolpidem (AMBIEN) 10 MG tablet TAKE 1 TABLET BY MOUTH EVERY DAY 90 tablet 1   No current facility-administered medications on file prior to visit.        ROS:  All others reviewed and  negative.  Objective        PE:  BP 130/78   Pulse (!) 56   Temp 97.8 F (36.6 C) (Oral)   Ht 5\' 3"  (1.6 m)   Wt 155 lb (70.3 kg)   SpO2 97%   BMI 27.46 kg/m                 Constitutional: Pt appears in NAD               HENT: Head: NCAT.                Right Ear:  External ear normal.                 Left Ear: External ear normal.                Eyes: . Pupils are equal, round, and reactive to light. Conjunctivae and EOM are normal               Nose: without d/c or deformity               Neck: Neck supple. Gross normal ROM               Cardiovascular: Normal rate and regular rhythm.                 Pulmonary/Chest: Effort normal and breath sounds without rales or wheezing.                Abd:  Soft, NT, ND, + BS, no organomegaly               Spine nontender in the mideline, but has severe tender spasm to right lower lumbar paravertebral               Neurological: Pt is alert. At baseline orientation, motor grossly intact               Skin: Skin is warm. No rashes, no other new lesions, LE edema - none               Psychiatric: Pt behavior is normal without agitation   Micro: none  Cardiac tracings I have personally interpreted today:  none  Pertinent Radiological findings (summarize): none   Lab Results  Component Value Date   WBC 8.2 10/06/2022   HGB 13.8 10/06/2022   HCT 42.2 10/06/2022   PLT 550.0 (H) 10/06/2022   GLUCOSE 103 (H) 10/06/2022   CHOL 231 (H) 10/06/2022   TRIG 299.0 (H) 10/06/2022   HDL 64.50 10/06/2022   LDLDIRECT 59.0 10/06/2022   LDLCALC 133 (H) 02/03/2017   ALT 15 10/06/2022   AST 20 10/06/2022   NA 140 10/06/2022   K 4.2 10/06/2022   CL 103 10/06/2022   CREATININE 1.13 10/06/2022   BUN 13 10/06/2022   CO2 28 10/06/2022   TSH 4.69 10/06/2022   INR 1.0 08/24/2018   HGBA1C 6.2 10/06/2022   Assessment/Plan:  [name] [name] is a 72 y.o. White or Caucasian [1] female with  has a past medical history of ASTHMA, BREAST CANCER, HX OF  (11/1979), CKD (chronic kidney disease) stage 3, GFR 30-59 ml/min ([facility]) (03/05/2020), GERD, GI bleed (11/92, 11/93, 11/94), Irritable bowel syndrome, MIGRAINE HEADACHE, and Symptomatic anemia (08/24/2018).  Blood pressure elevated without history of HTN BP Readings from Last 3 Encounters:  11/26/22 130/78  11/18/22 126/78  10/06/22 138/86   Borderline elevated, pt to continue medical treatment  - diet, wt control   Hyperglycemia Lab Results  Component Value Date   HGBA1C 6.2 10/06/2022   Stable, pt to continue current medical treatment  - diet,wt control, excercise   Low back pain Acute onset msk spasm severe without recent injury or fall; for toradol 30 mg IM, valium 5 mg bid prn short limited course, vicodin 5 325 prn, and xray ls spine, consdier PT or MRI for persistent or worsening pain  Followup: Return if symptoms worsen or fail to improve.  Cathlean Cower, MD 11/29/2022 5:56 AM [facility]

## 2022-11-29 ENCOUNTER — Encounter: Payer: Self-pay | Admitting: Internal Medicine

## 2022-11-29 NOTE — Assessment & Plan Note (Signed)
Lab Results  Component Value Date   HGBA1C 6.2 10/06/2022   Stable, pt to continue current medical treatment  - diet,wt control, excercise  

## 2022-11-29 NOTE — Assessment & Plan Note (Signed)
BP Readings from Last 3 Encounters:  11/26/22 130/78  11/18/22 126/78  10/06/22 138/86   Borderline elevated, pt to continue medical treatment  - diet, wt control

## 2022-11-29 NOTE — Assessment & Plan Note (Signed)
Acute onset msk spasm severe without recent injury or fall; for toradol 30 mg IM, valium 5 mg bid prn short limited course, vicodin 5 325 prn, and xray ls spine, consdier PT or MRI for persistent or worsening pain

## 2022-11-30 ENCOUNTER — Encounter: Payer: Self-pay | Admitting: Internal Medicine

## 2022-12-01 ENCOUNTER — Ambulatory Visit (INDEPENDENT_AMBULATORY_CARE_PROVIDER_SITE_OTHER): Payer: Medicare Other | Admitting: Internal Medicine

## 2022-12-01 ENCOUNTER — Encounter: Payer: Self-pay | Admitting: Internal Medicine

## 2022-12-01 VITALS — BP 130/82 | Temp 97.9°F | Ht 63.0 in | Wt 155.0 lb

## 2022-12-01 DIAGNOSIS — M545 Low back pain, unspecified: Secondary | ICD-10-CM

## 2022-12-01 DIAGNOSIS — R350 Frequency of micturition: Secondary | ICD-10-CM

## 2022-12-01 DIAGNOSIS — M109 Gout, unspecified: Secondary | ICD-10-CM | POA: Diagnosis not present

## 2022-12-01 DIAGNOSIS — N1831 Chronic kidney disease, stage 3a: Secondary | ICD-10-CM | POA: Diagnosis not present

## 2022-12-01 LAB — HEPATIC FUNCTION PANEL
ALT: 13 U/L (ref 0–35)
AST: 16 U/L (ref 0–37)
Albumin: 4 g/dL (ref 3.5–5.2)
Alkaline Phosphatase: 83 U/L (ref 39–117)
Bilirubin, Direct: 0.1 mg/dL (ref 0.0–0.3)
Total Bilirubin: 0.6 mg/dL (ref 0.2–1.2)
Total Protein: 7.4 g/dL (ref 6.0–8.3)

## 2022-12-01 LAB — BASIC METABOLIC PANEL
BUN: 12 mg/dL (ref 6–23)
CO2: 26 mEq/L (ref 19–32)
Calcium: 9.9 mg/dL (ref 8.4–10.5)
Chloride: 100 mEq/L (ref 96–112)
Creatinine, Ser: 1.23 mg/dL — ABNORMAL HIGH (ref 0.40–1.20)
GFR: 43.43 mL/min — ABNORMAL LOW (ref >60.00)
Glucose, Bld: 97 mg/dL (ref 70–99)
Potassium: 3.7 mEq/L (ref 3.5–5.1)
Sodium: 137 mEq/L (ref 135–145)

## 2022-12-01 LAB — CBC WITH DIFFERENTIAL/PLATELET
Basophils Absolute: 0.1 10*3/uL (ref 0.0–0.1)
Basophils Relative: 1.1 % (ref 0.0–3.0)
Eosinophils Absolute: 0.2 10*3/uL (ref 0.0–0.7)
Eosinophils Relative: 1.7 % (ref 0.0–5.0)
HCT: 39.1 % (ref 36.0–46.0)
Hemoglobin: 13 g/dL (ref 12.0–15.0)
Lymphocytes Relative: 24.9 % (ref 12.0–46.0)
Lymphs Abs: 2.8 10*3/uL (ref 0.7–4.0)
MCHC: 33.1 g/dL (ref 30.0–36.0)
MCV: 84.7 fl (ref 78.0–100.0)
Monocytes Absolute: 0.7 10*3/uL (ref 0.1–1.0)
Monocytes Relative: 6.5 % (ref 3.0–12.0)
Neutro Abs: 7.5 10*3/uL (ref 1.4–7.7)
Neutrophils Relative %: 65.8 % (ref 43.0–77.0)
Platelets: 588 10*3/uL — ABNORMAL HIGH (ref 150.0–400.0)
RBC: 4.62 Mil/uL (ref 3.87–5.11)
RDW: 14.4 % (ref 11.5–15.5)
WBC: 11.4 10*3/uL — ABNORMAL HIGH (ref 4.0–10.5)

## 2022-12-01 LAB — LIPASE: Lipase: <3 U/L — ABNORMAL LOW (ref 11.0–59.0)

## 2022-12-01 MED ORDER — COLCHICINE 0.6 MG PO TABS: ORAL_TABLET | ORAL | 3 refills | Status: DC

## 2022-12-01 MED ORDER — KETOROLAC TROMETHAMINE 30 MG/ML IJ SOLN
30.0000 mg | Freq: Once | INTRAMUSCULAR | Status: AC
  Administered 2022-12-01: 30 mg via INTRAVENOUS

## 2022-12-01 MED ORDER — METHYLPREDNISOLONE ACETATE 80 MG/ML IJ SUSP
80.0000 mg | Freq: Once | INTRAMUSCULAR | Status: AC
  Administered 2022-12-01: 80 mg via INTRAMUSCULAR

## 2022-12-01 MED ORDER — HYDROCODONE-ACETAMINOPHEN 5-325 MG PO TABS: 1.0000 | ORAL_TABLET | Freq: Four times a day (QID) | ORAL | 0 refills | Status: DC | PRN

## 2022-12-01 MED ORDER — PREDNISONE 10 MG PO TABS: ORAL_TABLET | ORAL | 0 refills | Status: DC

## 2022-12-01 NOTE — Assessment & Plan Note (Signed)
Mild to mod, for depomedrl 80 mg IM, toradol 30 mg Im, prednisone, colchine asd,  to f/u any worsening symptoms or concerns

## 2022-12-01 NOTE — Assessment & Plan Note (Addendum)
Exam somewhat improved with less upper lumbar paravetebral spasm but pain persists essentially little changed; vicodin 7.5 makes her sleepy - ok for vicodin 5 325 mg prn, for UA and cbc with labs, pt declines MRI ls spine, or even CT abd/pelvis to f/u other such as pancreatitis or infection source,  to f/u any worsening symptoms or concerns;  pt asking about antibx but has no fever, non toxic, and little to suggest specific etiology, will need UA and cx as above

## 2022-12-01 NOTE — Patient Instructions (Addendum)
You had the pain shot (toradol) and steroid shot today - both of which can help the foot and lower back  Please take all new medication as prescribed - the colchicine, prednisone, and lower dose hydrocodone as needed for pain  Please continue all other medications as before, and refills have been done if requested.  Please have the pharmacy call with any other refills you may need.  Please keep your appointments with your specialists as you may have planned  Please call if you change your mind about the MRI for the lower back  Please go to the LAB at the blood drawing area for the tests to be done  You will be contacted by phone if any changes need to be made immediately.  Otherwise, you will receive a letter about your results with an explanation, but please check with MyChart first.  Please remember to sign up for MyChart if you have not done so, as this will be important to you in the future with finding out test results, communicating by private email, and scheduling acute appointments online when needed.

## 2022-12-01 NOTE — Progress Notes (Signed)
Patient ID: [name] [name], female   DOB: [birth year], 72 y.o.   MRN: [id number]        Chief Complaint: follow up persistent back pain > 2 weeks, left foot acute gout, ckd       HPI:  [name] [name] is a 72 y.o. female here with c/o persistent pain improved with recent pain medication but no real improvement otherwise, now ongoing for > 2wks, located midline with more pain left paralumbar than right.  No rash, swelling, trauma, falls, gait worsening or LE weakness, fever, chills.  Pt is concerned about urinary status though admits Denies urinary symptoms such as dysuria, frequency, urgency, flank pain, hematuria or n/v, fever, chills.  Incidentally also has area to left lateral midfoot with 2-3 days onset red, tender, swelling and asking for colchicine that has helped in the past for other flare.         Wt Readings from Last 3 Encounters:  12/01/22 155 lb (70.3 kg)  11/26/22 155 lb (70.3 kg)  11/18/22 156 lb 2 oz (70.8 kg)   BP Readings from Last 3 Encounters:  12/01/22 130/82  11/26/22 130/78  11/18/22 126/78         Past Medical History:  Diagnosis Date   ASTHMA    BREAST CANCER, HX OF 11/1979   CKD (chronic kidney disease) stage 3, GFR 30-59 ml/min ([place]) 03/05/2020   GERD    GI bleed 11/92, 11/93, 11/94   GI Bleed   Irritable bowel syndrome    MIGRAINE HEADACHE    Symptomatic anemia 08/24/2018   Past Surgical History:  Procedure Laterality Date   ABDOMINAL HYSTERECTOMY  11/1979   BREAST SURGERY     ESOPHAGOGASTRODUODENOSCOPY N/A 08/26/2018   Procedure: ESOPHAGOGASTRODUODENOSCOPY (EGD);  Surgeon: Doran Stabler, MD;  Location: [place];  Service: Gastroenterology;  Laterality: N/A;   Left wrist  1963   MASTECTOMY  11/1979   Bilaterally & implants   OTHER SURGICAL HISTORY Bilateral    removal of breast implants   [place]    reports that she has never smoked. She has never used smokeless tobacco. She reports current alcohol use. She  reports that she does not use drugs. family history includes Breast cancer in her mother; Colon polyps (age of onset: 74) in her sister; Coronary artery disease in her mother; Coronary artery disease (age of onset: 58) in her father; Endometriosis in her sister; Heart disease in her brother; Hyperlipidemia in her brother; Hypertension in her brother; Irritable bowel syndrome in her sister; Uterine cancer in her mother. Allergies  Allergen Reactions   Albuterol Anaphylaxis    Pt states she has tolerated levalbuterol   Albuterol Sulfate Anaphylaxis    Pt states she has tolerated levalbuterol   Amoxicillin-Pot Clavulanate Anaphylaxis    Lips swell    Broccoli [Brassica Oleracea] Anaphylaxis   Covid-19 (Mrna) Vaccine Therapist, music) [Covid-19 (Mrna) Vaccine] Anaphylaxis   Drug Ingredient [Acetylcysteine] Shortness Of Breath   Epipen [Epinephrine] Other (See Comments)    Allergic to preservatives in Epipen with caution!!!   Fluzone Anaphylaxis   Influenza Vaccines Anaphylaxis   Morphine Anaphylaxis   Penicillin G Anaphylaxis    All cillins All cillins   Povidone-Iodine Anaphylaxis and Hives   Sulfonamide Derivatives Anaphylaxis   Atrovent [Ipratropium] Other (See Comments)    Has sulfa in preservative   Ciprofloxacin Itching    Itchy palms   Hyoscyamine Sulfate Hives   Latex Itching  Meperidine Hcl Nausea And Vomiting    REACTION: N\T\V   Other Other (See Comments) and Swelling    Mucomist-unable to breathe Arm swelling with red streaks   Pneumococcal Vaccines Other (See Comments)    Pt declines   Ppd [Tuberculin Purified Protein Derivative] Swelling and Other (See Comments)    Arm swelling with red streaks   Reglan [Metoclopramide] Other (See Comments)   Zostavax [Zoster Vaccine Live] Other (See Comments)    Pt declines   Pepcid [Famotidine] Nausea And Vomiting, Palpitations and Other (See Comments)    Due to sulfide preservative   Current Outpatient Medications on File Prior to  Visit  Medication Sig Dispense Refill   Ascorbic Acid (VITAMIN C) 1000 MG tablet Take 1,000 mg by mouth daily.     aspirin-acetaminophen-caffeine (EXCEDRIN MIGRAINE) 250-250-65 MG per tablet Take 1 tablet by mouth every 6 (six) hours as needed. For migraine     Cyanocobalamin (VITAMIN B 12 PO) Take 1 tablet by mouth daily.     cyclobenzaprine (FLEXERIL) 10 MG tablet Take 1 tablet by mouth 3 times daily. Annual appt due in May must see provider for future refills 60 tablet 2   diazepam (VALIUM) 5 MG tablet 1/2 - 1 tab by mouth twice per day as needed 30 tablet 1   EPINEPHrine 0.3 mg/0.3 mL IJ SOAJ injection As directed for severe allergic reaction (Patient taking differently: Inject 0.3 mg into the muscle once as needed for anaphylaxis.) 2 each 12   gabapentin (NEURONTIN) 100 MG capsule Take 1 capsule (100 mg total) by mouth 3 (three) times daily. 90 capsule 5   levalbuterol (XOPENEX HFA) 45 MCG/ACT inhaler Inhale 1-2 puffs into the lungs every 6 (six) hours as needed. For shortness of breath 15 g 12   levalbuterol (XOPENEX) 1.25 MG/3ML nebulizer solution Take 1.25 mg by nebulization every 4 (four) hours as needed for wheezing. 72 mL 12   Melatonin 10 MG CAPS Take 10 mg by mouth at bedtime as needed (sleep).      mometasone-formoterol (DULERA) 200-5 MCG/ACT AERO Inhale 2 puffs into the lungs 2 (two) times daily. Rinse mouth 1 each 5   pantoprazole (PROTONIX) 40 MG tablet Take 1 tablet (40 mg total) by mouth daily. Annual appt due in March must see provider for future refills 90 tablet 0   traMADol (ULTRAM) 50 MG tablet Take 1 tablet (50 mg total) by mouth every 6 (six) hours as needed. 30 tablet 0   triamcinolone ointment (KENALOG) 0.1 % Apply topically 2 (two) times daily. 30 g 0   vitamin E 400 UNIT capsule Take 400 Units by mouth daily.     zolpidem (AMBIEN) 10 MG tablet TAKE 1 TABLET BY MOUTH EVERY DAY 90 tablet 1   diphenhydrAMINE (BENADRYL) 25 MG tablet Take 1 tablet (25 mg total) by mouth  every 6 (six) hours as needed for up to 5 days. 20 tablet 0   No current facility-administered medications on file prior to visit.        ROS:  All others reviewed and negative.  Objective        PE:  BP 130/82   Temp 97.9 F (36.6 C) (Oral)   Ht 5\' 3"  (1.6 m)   Wt 155 lb (70.3 kg)   SpO2 94%   BMI 27.46 kg/m                 Constitutional: Pt appears in NAD  HENT: Head: NCAT.                Right Ear: External ear normal.                 Left Ear: External ear normal.                Eyes: . Pupils are equal, round, and reactive to light. Conjunctivae and EOM are normal               Nose: without d/c or deformity               Neck: Neck supple. Gross normal ROM               Cardiovascular: Normal rate and regular rhythm.                 Pulmonary/Chest: Effort normal and breath sounds without rales or wheezing.                Abd:  Soft, NT, ND, + BS, no organomegaly;  spine mild tender in midline without swelling with some tenderness left > right paravertebral tenderness               Neurological: Pt is alert. At baseline orientation, motor grossly intact, cn 2-12 intact               Skin: Skin is warm. No rashes, no other new lesions, LE edema - none; left mid foot with x 2 x 2 cm area redness and tender and distal foot swelling without drainage, ulcer o/w neurovasc intact               Psychiatric: Pt behavior is normal without agitation   Micro: none  Cardiac tracings I have personally interpreted today:  none  Pertinent Radiological findings (summarize): none   Lab Results  Component Value Date   WBC 11.4 (H) 12/01/2022   HGB 13.0 12/01/2022   HCT 39.1 12/01/2022   PLT 588.0 (H) 12/01/2022   GLUCOSE 97 12/01/2022   CHOL 231 (H) 10/06/2022   TRIG 299.0 (H) 10/06/2022   HDL 64.50 10/06/2022   LDLDIRECT 59.0 10/06/2022   LDLCALC 133 (H) 02/03/2017   ALT 13 12/01/2022   AST 16 12/01/2022   NA 137 12/01/2022   K 3.7 12/01/2022   CL 100 12/01/2022    CREATININE 1.23 (H) 12/01/2022   BUN 12 12/01/2022   CO2 26 12/01/2022   TSH 4.69 10/06/2022   INR 1.0 08/24/2018   HGBA1C 6.2 10/06/2022   Assessment/Plan:  [name] [name] is a 72 y.o. White or Caucasian [1] female with  has a past medical history of ASTHMA, BREAST CANCER, HX OF (11/1979), CKD (chronic kidney disease) stage 3, GFR 30-59 ml/min ([facility]) (03/05/2020), GERD, GI bleed (11/92, 11/93, 11/94), Irritable bowel syndrome, MIGRAINE HEADACHE, and Symptomatic anemia (08/24/2018).  Acute gouty arthritis Mild to mod, for depomedrl 80 mg IM, toradol 30 mg Im, prednisone, colchine asd,  to f/u any worsening symptoms or concerns  Low back pain Exam somewhat improved with less upper lumbar paravetebral spasm but pain persists essentially little changed; vicodin 7.5 makes her sleepy - ok for vicodin 5 325 mg prn, for UA and cbc with labs, pt declines MRI ls spine, or even CT abd/pelvis to f/u other such as pancreatitis or infection source,  to f/u any worsening symptoms or concerns;  pt asking about antibx but has no fever, non toxic, and little to suggest  specific etiology, will need UA and cx as above  CKD (chronic kidney disease) stage 3, GFR 30-59 ml/min Lab Results  Component Value Date   CREATININE 1.23 (H) 12/01/2022   Stable overall, cont to avoid nephrotoxins   Followup: Return if symptoms worsen or fail to improve.  Cathlean Cower, MD 12/01/2022 9:30 PM [facility]

## 2022-12-01 NOTE — Assessment & Plan Note (Signed)
Lab Results  Component Value Date   CREATININE 1.23 (H) 12/01/2022   Stable overall, cont to avoid nephrotoxins

## 2022-12-02 LAB — URINALYSIS, ROUTINE W REFLEX MICROSCOPIC
Bilirubin Urine: NEGATIVE
Hgb urine dipstick: NEGATIVE
Ketones, ur: NEGATIVE
Leukocytes,Ua: NEGATIVE
Nitrite: NEGATIVE
RBC / HPF: NONE SEEN (ref 0–?)
Specific Gravity, Urine: <=1.005 — AB (ref 1.000–1.030)
Total Protein, Urine: NEGATIVE
Urine Glucose: NEGATIVE
Urobilinogen, UA: 0.2 (ref 0.0–1.0)
pH: 6 (ref 5.0–8.0)

## 2022-12-03 ENCOUNTER — Telehealth: Payer: Self-pay

## 2022-12-03 ENCOUNTER — Other Ambulatory Visit: Payer: Self-pay | Admitting: Internal Medicine

## 2022-12-03 LAB — URINE CULTURE

## 2022-12-03 MED ORDER — CLINDAMYCIN HCL 300 MG PO CAPS: 300.0000 mg | ORAL_CAPSULE | Freq: Three times a day (TID) | ORAL | 0 refills | Status: DC

## 2022-12-03 NOTE — Telephone Encounter (Signed)
Contacted [name] [name] to schedule their annual wellness visit. Appointment made for 12/10/22.  Norton Blizzard, [place] ([facility])  [facility] [place] [facility] [phone]

## 2022-12-10 ENCOUNTER — Ambulatory Visit (INDEPENDENT_AMBULATORY_CARE_PROVIDER_SITE_OTHER): Payer: Medicare Other

## 2022-12-10 VITALS — Ht 63.0 in | Wt 155.0 lb

## 2022-12-10 DIAGNOSIS — Z0000 Encounter for general adult medical examination without abnormal findings: Secondary | ICD-10-CM | POA: Diagnosis not present

## 2022-12-10 NOTE — Progress Notes (Addendum)
I connected with  Vick Frees on 12/10/22 by a audio enabled telemedicine application and verified that I am speaking with the correct person using two identifiers.  Patient Location: Home  Provider Location: Office/Clinic  I discussed the limitations of evaluation and management by telemedicine. The patient expressed understanding and agreed to proceed.  Patient Medicare AWV questionnaire was completed by the patient on 12/10/2022; I have confirmed that all information answered by patient is correct and no changes since this date.    Subjective:   [name] [name] is a 72 y.o. female who presents for Medicare Annual (Subsequent) preventive examination.  Review of Systems     Cardiac Risk Factors include: advanced age (>59men, >39 women);family history of premature cardiovascular disease;sedentary lifestyle     Objective:    Today's Vitals   12/10/22 1514  Weight: 155 lb (70.3 kg)  Height: 5\' 3"  (1.6 m)  PainSc: 0-No pain   Body mass index is 27.46 kg/m.     12/10/2022    3:18 PM 04/30/2022    9:33 AM 12/08/2021    1:48 PM 11/26/2020    9:16 AM 06/28/2019    9:00 AM 08/25/2018    7:43 PM 08/24/2018    2:03 PM  Advanced Directives  Does Patient Have a Medical Advance Directive? Yes Yes Yes Yes Yes Yes Yes  Type of Paramedic of [place];Living will;Out of facility DNR (pink MOST or yellow form) [place];Living will [place];Living will Living will;Healthcare Power of [place];Out of facility DNR (pink MOST or yellow form) [place];Living will [place];Living will [place];Living will  Does patient want to make changes to medical advance directive?  No - Patient declined  No - Patient declined  No - Patient declined   Copy of Ali Molina in Chart? No - copy requested No - copy requested No - copy requested No - copy requested No - copy  requested Yes - validated most recent copy scanned in chart (See row information)     Current Medications (verified) Outpatient Encounter Medications as of 12/10/2022  Medication Sig   clindamycin (CLEOCIN) 300 MG capsule Take 1 capsule (300 mg total) by mouth 3 (three) times daily.   Ascorbic Acid (VITAMIN C) 1000 MG tablet Take 1,000 mg by mouth daily.   aspirin-acetaminophen-caffeine (EXCEDRIN MIGRAINE) 250-250-65 MG per tablet Take 1 tablet by mouth every 6 (six) hours as needed. For migraine   colchicine 0.6 MG tablet 1 tab by mouth every 4 hours until pain improved or diarrhea   Cyanocobalamin (VITAMIN B 12 PO) Take 1 tablet by mouth daily.   cyclobenzaprine (FLEXERIL) 10 MG tablet Take 1 tablet by mouth 3 times daily. Annual appt due in May must see provider for future refills   diazepam (VALIUM) 5 MG tablet 1/2 - 1 tab by mouth twice per day as needed   diphenhydrAMINE (BENADRYL) 25 MG tablet Take 1 tablet (25 mg total) by mouth every 6 (six) hours as needed for up to 5 days.   EPINEPHrine 0.3 mg/0.3 mL IJ SOAJ injection As directed for severe allergic reaction (Patient taking differently: Inject 0.3 mg into the muscle once as needed for anaphylaxis.)   HYDROcodone-acetaminophen (NORCO/VICODIN) 5-325 MG tablet Take 1 tablet by mouth every 6 (six) hours as needed.   levalbuterol (XOPENEX HFA) 45 MCG/ACT inhaler Inhale 1-2 puffs into the lungs every 6 (six) hours as needed. For shortness of breath  levalbuterol (XOPENEX) 1.25 MG/3ML nebulizer solution Take 1.25 mg by nebulization every 4 (four) hours as needed for wheezing.   Melatonin 10 MG CAPS Take 10 mg by mouth at bedtime as needed (sleep).    mometasone-formoterol (DULERA) 200-5 MCG/ACT AERO Inhale 2 puffs into the lungs 2 (two) times daily. Rinse mouth   pantoprazole (PROTONIX) 40 MG tablet Take 1 tablet (40 mg total) by mouth daily. Annual appt due in March must see provider for future refills   traMADol (ULTRAM) 50 MG tablet Take  1 tablet (50 mg total) by mouth every 6 (six) hours as needed.   triamcinolone ointment (KENALOG) 0.1 % Apply topically 2 (two) times daily.   vitamin E 400 UNIT capsule Take 400 Units by mouth daily.   zolpidem (AMBIEN) 10 MG tablet TAKE 1 TABLET BY MOUTH EVERY DAY   [DISCONTINUED] gabapentin (NEURONTIN) 100 MG capsule Take 1 capsule (100 mg total) by mouth 3 (three) times daily.   [DISCONTINUED] predniSONE (DELTASONE) 10 MG tablet 3 tabs by mouth per day for 3 days,2tabs per day for 3 days,1tab per day for 3 days   No facility-administered encounter medications on file as of 12/10/2022.    Allergies (verified) Albuterol, Albuterol sulfate, Amoxicillin-pot clavulanate, Broccoli [brassica oleracea], Covid-19 (mrna) vaccine (pfizer) [covid-19 (mrna) vaccine], Drug ingredient [acetylcysteine], Epipen [epinephrine], Fluzone, Influenza vaccines, Morphine, Penicillin g, Povidone-iodine, Sulfonamide derivatives, Atrovent [ipratropium], Ciprofloxacin, Hyoscyamine sulfate, Latex, Meperidine hcl, Other, Pneumococcal vaccines, Ppd [tuberculin purified protein derivative], Reglan [metoclopramide], Zostavax [zoster vaccine live], and Pepcid [famotidine]   History: Past Medical History:  Diagnosis Date   ASTHMA    BREAST CANCER, HX OF 11/1979   CKD (chronic kidney disease) stage 3, GFR 30-59 ml/min ([place]) 03/05/2020   GERD    GI bleed 11/92, 11/93, 11/94   GI Bleed   Irritable bowel syndrome    MIGRAINE HEADACHE    Symptomatic anemia 08/24/2018   Past Surgical History:  Procedure Laterality Date   ABDOMINAL HYSTERECTOMY  11/1979   BREAST SURGERY     ESOPHAGOGASTRODUODENOSCOPY N/A 08/26/2018   Procedure: ESOPHAGOGASTRODUODENOSCOPY (EGD);  Surgeon: Doran Stabler, MD;  Location: [facility];  Service: Gastroenterology;  Laterality: N/A;   Left wrist  1963   MASTECTOMY  11/1979   Bilaterally & implants   OTHER SURGICAL HISTORY Bilateral    removal of breast implants   TONSILLECTOMY  1955   TUBAL  LIGATION  1975   Family History  Problem Relation Age of Onset   Coronary artery disease Father 68       CABG age 74   Breast cancer Mother    Uterine cancer Mother    Coronary artery disease Mother    Endometriosis Sister    Colon polyps Sister 21       1/2 sister - same dad   Irritable bowel syndrome Sister    Hyperlipidemia Brother        1/2 bro, same dad   Hypertension Brother        1/2 bro, same dad   Heart disease Brother    Colon cancer Neg Hx    Liver cancer Neg Hx    Social History   Socioeconomic History   Marital status: Married    Spouse name: Not on file   Number of children: 1   Years of education: Not on file   Highest education level: Not on file  Occupational History   Occupation: retired  Tobacco Use   Smoking status: Never   Smokeless tobacco:  Never  Vaping Use   Vaping Use: Never used  Substance and Sexual Activity   Alcohol use: Yes    Alcohol/week: 0.0 standard drinks of alcohol    Comment: occ   Drug use: No   Sexual activity: Not on file  Other Topics Concern   Not on file  Social History Narrative   Married, lives at home with spouse.    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   Social Determinants of Health   Financial Resource Strain: Low Risk  (12/10/2022)   Overall Financial Resource Strain (CARDIA)    Difficulty of Paying Living Expenses: Not hard at all  Food Insecurity: No Food Insecurity (12/10/2022)   Hunger Vital Sign    Worried About Running Out of Food in the Last Year: Never true    [place] in the Last Year: Never true  Transportation Needs: No Transportation Needs (12/10/2022)   PRAPARE - Hydrologist (Medical): No    Lack of Transportation (Non-Medical): No  Physical Activity: Inactive (12/10/2022)   Exercise Vital Sign    Days of Exercise per Week: 0 days    Minutes of Exercise per Session: 0 min  Stress: No Stress Concern Present  (12/10/2022)   [place]    Feeling of Stress : Not at all  Social Connections: Unknown (12/10/2022)   Social Connection and Isolation Panel [NHANES]    Frequency of Communication with Friends and Family: More than three times a week    Frequency of Social Gatherings with Friends and Family: More than three times a week    Attends Religious Services: Not on Advertising copywriter or Organizations: Yes    Attends Archivist Meetings: 1 to 4 times per year    Marital Status: Married    Tobacco Counseling Counseling given: Not Answered   Clinical Intake:  Pre-visit preparation completed: Yes  Pain : No/denies pain Pain Score: 0-No pain     BMI - recorded: 27.46 Nutritional Status: BMI 25 -29 Overweight Nutritional Risks: None Diabetes: No  How often do you need to have someone help you when you read instructions, pamphlets, or other written materials from your doctor or pharmacy?: 1 - Never 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 No  Interpreter Needed?: No  Information entered by :: Lisette Abu, LPN.   Activities of Daily Living    12/10/2022    3:19 PM 12/10/2022   11:09 AM  In your present state of health, do you have any difficulty performing the following activities:  Hearing? 0 0  Vision? 0 0  Difficulty concentrating or making decisions? 0 0  Walking or climbing stairs? 0 0  Dressing or bathing? 0 0  Doing errands, shopping? 0 0  Preparing Food and eating ? N N  Using the Toilet? N N  In the past six months, have you accidently leaked urine? Y Y  Do you have problems with loss of bowel control? N N  Managing your Medications? N N  Managing your Finances? N N  Housekeeping or managing your Housekeeping? N N    Patient Care Team: Biagio Borg, MD as PCP - General (Internal Medicine) Deneise Lever, MD as Consulting Physician  (Pulmonary Disease) Servando Salina, MD as Consulting Physician (Obstetrics and Gynecology) [name],  MD as Consulting Physician (Gastroenterology) Lonia Skinner, MD as Consulting Physician (Ophthalmology)  Indicate any recent Medical Services you may have received from other than [facility] providers in the past year (date may be approximate).     Assessment:   This is a routine wellness examination for Lyanne.  Hearing/Vision screen Hearing Screening - Comments:: Denies hearing difficulties   Vision Screening - Comments:: Wears rx glasses - up to date with routine eye exams with Gala Romney, MD.   Dietary issues and exercise activities discussed: Current Exercise Habits: The patient does not participate in regular exercise at present, Exercise limited by: respiratory conditions(s)   Goals Addressed             This Visit's Progress    My goal for 2024 is to stay out of Dr. Gwynn Burly office. LOL!!!!!        Depression Screen    12/10/2022    3:17 PM 11/18/2022    2:56 PM 10/06/2022   10:58 AM 10/06/2022   10:26 AM 12/08/2021    1:49 PM 12/08/2021    1:47 PM 03/06/2021    2:22 PM  PHQ 2/9 Scores  PHQ - 2 Score 0 0 0 0 0 0 0  PHQ- 9 Score 0 2  0       Fall Risk    12/10/2022    3:19 PM 12/10/2022   11:09 AM 11/18/2022    2:56 PM 10/06/2022   10:58 AM 10/06/2022   10:26 AM  [place] in the past year? 0 0 0 0 0  Number falls in past yr: 0 0 0 0 0  Injury with Fall? 0 0 0 0 0  Risk for fall due to : No Fall Risks  No Fall Risks  No Fall Risks  Follow up Falls prevention discussed  Falls evaluation completed  Falls evaluation completed    FALL RISK PREVENTION PERTAINING TO THE HOME:  Any stairs in or around the home? No  If so, are there any without handrails? No  Home free of loose throw rugs in walkways, pet beds, electrical cords, etc? Yes  Adequate lighting in your home to reduce risk of falls? Yes   ASSISTIVE DEVICES UTILIZED TO PREVENT  FALLS:  Life alert? No  Use of a cane, walker or w/c? No  Grab bars in the bathroom? No  Shower chair or bench in shower? Yes  Elevated toilet seat or a handicapped toilet? Yes   TIMED UP AND GO:  Was the test performed? No . Telephonic Visit   Cognitive Function:        12/10/2022    3:20 PM  6CIT Screen  What Year? 0 points  What month? 0 points  What time? 0 points  Count back from 20 0 points  Months in reverse 0 points  Repeat phrase 0 points  Total Score 0 points    Immunizations Immunization History  Administered Date(s) Administered   Influenza Whole 09/14/2006   PFIZER(Purple Top)SARS-COV-2 Vaccination 06/04/2020   Td 09/15/2007    TDAP status: Declined  Flu Vaccine status: Declined, Education has been provided regarding the importance of this vaccine but patient still declined. Advised may receive this vaccine at local pharmacy or Health Dept. Aware to provide a copy of the vaccination record if obtained from local pharmacy or Health Dept. Verbalized acceptance and understanding.  Pneumococcal vaccine status: Declined,  Education has been provided regarding the importance of this vaccine but patient still declined.  Advised may receive this vaccine at local pharmacy or Health Dept. Aware to provide a copy of the vaccination record if obtained from local pharmacy or Health Dept. Verbalized acceptance and understanding.   Covid-19 vaccine status: Declined, Education has been provided regarding the importance of this vaccine but patient still declined. Advised may receive this vaccine at local pharmacy or Health Dept.or vaccine clinic. Aware to provide a copy of the vaccination record if obtained from local pharmacy or Health Dept. Verbalized acceptance and understanding.  Qualifies for Shingles Vaccine? Yes   Zostavax completed No   Shingrix Completed?: No.    Education has been provided regarding the importance of this vaccine. Patient has been advised to call  insurance company to determine out of pocket expense if they have not yet received this vaccine. Advised may also receive vaccine at local pharmacy or Health Dept. Verbalized acceptance and understanding.  Screening Tests Health Maintenance  Topic Date Due   Pneumonia Vaccine 103+ Years old (1 of 1 - PCV) 10/07/2023 (Originally 11/13/2013)   Medicare Annual Wellness (AWV)  12/10/2023   DEXA SCAN  Completed   Hepatitis C Screening  Completed   HPV VACCINES  Aged Out   DTaP/Tdap/Td  Discontinued   MAMMOGRAM  Discontinued   COVID-19 Vaccine  Discontinued   Zoster Vaccines- Shingrix  Discontinued    Health Maintenance  There are no preventive care reminders to display for this patient.   Colorectal cancer screening: No longer required.   Mammogram status: No longer required due to history of breast cancer.  Bone Density status: Completed 02/03/2017. Results reflect: Bone density results: OSTEOPENIA. Repeat every 3 years.  Lung Cancer Screening: (Low Dose CT Chest recommended if Age 65-80 years, 30 pack-year currently smoking OR have quit w/in 15years.) does not qualify.   Lung Cancer Screening Referral: no  Additional Screening:  Hepatitis C Screening: does qualify; Completed 11/20/2015  Vision Screening: Recommended annual ophthalmology exams for early detection of glaucoma and other disorders of the eye. Is the patient up to date with their annual eye exam?  Yes  Who is the provider or what is the name of the office in which the patient attends annual eye exams? Gala Romney, MD. If pt is not established with a provider, would they like to be referred to a provider to establish care? No .   Dental Screening: Recommended annual dental exams for proper oral hygiene  Community Resource Referral / Chronic Care Management: CRR required this visit?  No   CCM required this visit?  No      Plan:     I have personally reviewed and noted the following in the patient's chart:    Medical and social history Use of alcohol, tobacco or illicit drugs  Current medications and supplements including opioid prescriptions. Patient is currently taking opioid prescriptions. Information provided to patient regarding non-opioid alternatives. Patient advised to discuss non-opioid treatment plan with their provider. Functional ability and status Nutritional status Physical activity Advanced directives List of other physicians Hospitalizations, surgeries, and ER visits in previous 12 months Vitals Screenings to include cognitive, depression, and falls Referrals and appointments  In addition, I have reviewed and discussed with patient certain preventive protocols, quality metrics, and best practice recommendations. A written personalized care plan for preventive services as well as general preventive health recommendations were provided to patient.     Sheral Flow, LPN   [id number]   Nurse Notes:  Normal cognitive status assessed by direct observation by this  Nurse Health Advisor by telephone conversation. No abnormalities found.   Patient Medicare AWV questionnaire was completed by the patient on 12/10/2022; I have confirmed that all information answered by patient is correct and no changes since this date.

## 2022-12-10 NOTE — Patient Instructions (Signed)
[name] [name] , Thank you for taking time to come for your Medicare Wellness Visit. I appreciate your ongoing commitment to your health goals. Please review the following plan we discussed and let me know if I can assist you in the future.   These are the goals we discussed:  Goals      My goal for 2024 is to stay out of Dr. Gwynn Burly office. LOL!!!!!        This is a list of the screening recommended for you and due dates:  Health Maintenance  Topic Date Due   Pneumonia Vaccine (1 of 1 - PCV) 10/07/2023*   Medicare Annual Wellness Visit  12/10/2023   DEXA scan (bone density measurement)  Completed   Hepatitis C Screening: USPSTF Recommendation to screen - Ages 74-74 yo.  Completed   HPV Vaccine  Aged Out   DTaP/Tdap/Td vaccine  Discontinued   Mammogram  Discontinued   COVID-19 Vaccine  Discontinued   Zoster (Shingles) Vaccine  Discontinued  *Topic was postponed. The date shown is not the original due date.    Advanced directives: Yes  Conditions/risks identified: Yes  Next appointment: Follow up in one year for your annual wellness visit.   Preventive Care 74 Years and Older, Female Preventive care refers to lifestyle choices and visits with your health care provider that can promote health and wellness. What does preventive care include? A yearly physical exam. This is also called an annual well check. Dental exams once or twice a year. Routine eye exams. Ask your health care provider how often you should have your eyes checked. Personal lifestyle choices, including: Daily care of your teeth and gums. Regular physical activity. Eating a healthy diet. Avoiding tobacco and drug use. Limiting alcohol use. Practicing safe sex. Taking low-dose aspirin every day. Taking vitamin and mineral supplements as recommended by your health care provider. What happens during an annual well check? The services and screenings done by your health care provider during your annual well check  will depend on your age, overall health, lifestyle risk factors, and family history of disease. Counseling  Your health care provider may ask you questions about your: Alcohol use. Tobacco use. Drug use. Emotional well-being. Home and relationship well-being. Sexual activity. Eating habits. History of falls. Memory and ability to understand (cognition). Work and work Statistician. Reproductive health. Screening  You may have the following tests or measurements: Height, weight, and BMI. Blood pressure. Lipid and cholesterol levels. These may be checked every 5 years, or more frequently if you are over 58 years old. Skin check. Lung cancer screening. You may have this screening every year starting at age 42 if you have a 30-pack-year history of smoking and currently smoke or have quit within the past 15 years. Fecal occult blood test (FOBT) of the stool. You may have this test every year starting at age 7. Flexible sigmoidoscopy or colonoscopy. You may have a sigmoidoscopy every 5 years or a colonoscopy every 10 years starting at age 13. Hepatitis C blood test. Hepatitis B blood test. Sexually transmitted disease (STD) testing. Diabetes screening. This is done by checking your blood sugar (glucose) after you have not eaten for a while (fasting). You may have this done every 1-3 years. Bone density scan. This is done to screen for osteoporosis. You may have this done starting at age 70. Mammogram. This may be done every 1-2 years. Talk to your health care provider about how often you should have regular mammograms. Talk with your  health care provider about your test results, treatment options, and if necessary, the need for more tests. Vaccines  Your health care provider may recommend certain vaccines, such as: Influenza vaccine. This is recommended every year. Tetanus, diphtheria, and acellular pertussis (Tdap, Td) vaccine. You may need a Td booster every 10 years. Zoster vaccine. You  may need this after age 40. Pneumococcal 13-valent conjugate (PCV13) vaccine. One dose is recommended after age 57. Pneumococcal polysaccharide (PPSV23) vaccine. One dose is recommended after age 4. Talk to your health care provider about which screenings and vaccines you need and how often you need them. This information is not intended to replace advice given to you by your health care provider. Make sure you discuss any questions you have with your health care provider. Document Released: 09/27/2015 Document Revised: 05/20/2016 Document Reviewed: 07/02/2015 Elsevier Interactive Patient Education  2017 [place] Prevention in the Home Falls can cause injuries. They can happen to people of all ages. There are many things you can do to make your home safe and to help prevent falls. What can I do on the outside of my home? Regularly fix the edges of walkways and driveways and fix any cracks. Remove anything that might make you trip as you walk through a door, such as a raised step or threshold. Trim any bushes or trees on the path to your home. Use bright outdoor lighting. Clear any walking paths of anything that might make someone trip, such as rocks or tools. Regularly check to see if handrails are loose or broken. Make sure that both sides of any steps have handrails. Any raised decks and porches should have guardrails on the edges. Have any leaves, snow, or ice cleared regularly. Use sand or salt on walking paths during winter. Clean up any spills in your garage right away. This includes oil or grease spills. What can I do in the bathroom? Use night lights. Install grab bars by the toilet and in the tub and shower. Do not use towel bars as grab bars. Use non-skid mats or decals in the tub or shower. If you need to sit down in the shower, use a plastic, non-slip stool. Keep the floor dry. Clean up any water that spills on the floor as soon as it happens. Remove soap buildup  in the tub or shower regularly. Attach bath mats securely with double-sided non-slip rug tape. Do not have throw rugs and other things on the floor that can make you trip. What can I do in the bedroom? Use night lights. Make sure that you have a light by your bed that is easy to reach. Do not use any sheets or blankets that are too big for your bed. They should not hang down onto the floor. Have a firm chair that has side arms. You can use this for support while you get dressed. Do not have throw rugs and other things on the floor that can make you trip. What can I do in the kitchen? Clean up any spills right away. Avoid walking on wet floors. Keep items that you use a lot in easy-to-reach places. If you need to reach something above you, use a strong step stool that has a grab bar. Keep electrical cords out of the way. Do not use floor polish or wax that makes floors slippery. If you must use wax, use non-skid floor wax. Do not have throw rugs and other things on the floor that can make you trip. What  can I do with my stairs? Do not leave any items on the stairs. Make sure that there are handrails on both sides of the stairs and use them. Fix handrails that are broken or loose. Make sure that handrails are as long as the stairways. Check any carpeting to make sure that it is firmly attached to the stairs. Fix any carpet that is loose or worn. Avoid having throw rugs at the top or bottom of the stairs. If you do have throw rugs, attach them to the floor with carpet tape. Make sure that you have a light switch at the top of the stairs and the bottom of the stairs. If you do not have them, ask someone to add them for you. What else can I do to help prevent falls? Wear shoes that: Do not have high heels. Have rubber bottoms. Are comfortable and fit you well. Are closed at the toe. Do not wear sandals. If you use a stepladder: Make sure that it is fully opened. Do not climb a closed  stepladder. Make sure that both sides of the stepladder are locked into place. Ask someone to hold it for you, if possible. Clearly mark and make sure that you can see: Any grab bars or handrails. First and last steps. Where the edge of each step is. Use tools that help you move around (mobility aids) if they are needed. These include: Canes. Walkers. Scooters. Crutches. Turn on the lights when you go into a dark area. Replace any light bulbs as soon as they burn out. Set up your furniture so you have a clear path. Avoid moving your furniture around. If any of your floors are uneven, fix them. If there are any pets around you, be aware of where they are. Review your medicines with your doctor. Some medicines can make you feel dizzy. This can increase your chance of falling. Ask your doctor what other things that you can do to help prevent falls. This information is not intended to replace advice given to you by your health care provider. Make sure you discuss any questions you have with your health care provider. Document Released: 06/27/2009 Document Revised: 02/06/2016 Document Reviewed: 10/05/2014 Elsevier Interactive Patient Education  2017 [employer].

## 2022-12-22 ENCOUNTER — Encounter: Payer: Self-pay | Admitting: Internal Medicine

## 2022-12-22 ENCOUNTER — Ambulatory Visit (INDEPENDENT_AMBULATORY_CARE_PROVIDER_SITE_OTHER): Payer: Medicare Other | Admitting: Internal Medicine

## 2022-12-22 VITALS — BP 128/84 | HR 63 | Temp 97.7°F | Ht 63.0 in | Wt 148.0 lb

## 2022-12-22 DIAGNOSIS — M545 Low back pain, unspecified: Secondary | ICD-10-CM | POA: Diagnosis not present

## 2022-12-22 DIAGNOSIS — R739 Hyperglycemia, unspecified: Secondary | ICD-10-CM

## 2022-12-22 DIAGNOSIS — N1831 Chronic kidney disease, stage 3a: Secondary | ICD-10-CM | POA: Diagnosis not present

## 2022-12-22 DIAGNOSIS — R109 Unspecified abdominal pain: Secondary | ICD-10-CM

## 2022-12-22 LAB — URINALYSIS, ROUTINE W REFLEX MICROSCOPIC
Bilirubin Urine: NEGATIVE
Hgb urine dipstick: NEGATIVE
Ketones, ur: NEGATIVE
Nitrite: NEGATIVE
Specific Gravity, Urine: 1.02 (ref 1.000–1.030)
Total Protein, Urine: NEGATIVE
Urine Glucose: NEGATIVE
Urobilinogen, UA: 0.2 (ref 0.0–1.0)
pH: 6 (ref 5.0–8.0)

## 2022-12-22 MED ORDER — CLINDAMYCIN HCL 300 MG PO CAPS: 300.0000 mg | ORAL_CAPSULE | Freq: Three times a day (TID) | ORAL | 0 refills | Status: DC

## 2022-12-22 MED ORDER — TRAMADOL HCL 50 MG PO TABS: 50.0000 mg | ORAL_TABLET | Freq: Four times a day (QID) | ORAL | 1 refills | Status: DC | PRN

## 2022-12-22 NOTE — Assessment & Plan Note (Signed)
Lab Results  Component Value Date   HGBA1C 6.2 10/06/2022   Stable, pt to continue current medical treatment  - diet, wt control  

## 2022-12-22 NOTE — Progress Notes (Deleted)
Patient ID: [name] [name], female   DOB: 1951-02-12, 72 y.o.   MRN: [id number]

## 2022-12-22 NOTE — Assessment & Plan Note (Signed)
Etiology unclear, ok for empiric repeat cleocin 300 tid, ua and culture, CT renal and refer urology

## 2022-12-22 NOTE — Assessment & Plan Note (Signed)
Chronic stable with worsening LE symptoms

## 2022-12-22 NOTE — Patient Instructions (Signed)
Please take all new medication as prescribed - the cleocin and tramadol as needed  Please continue all other medications as before, and refills have been done if requested.  Please have the pharmacy call with any other refills you may need.  Please keep your appointments with your specialists as you may have planned  You will be contacted regarding the referral for: CT renal stone study  You will be contacted regarding the referral for: Urologyt (female if possible)  Please go to the LAB at the blood drawing area for the tests to be done - just the urine testing today  You will be contacted by phone if any changes need to be made immediately.  Otherwise, you will receive a letter about your results with an explanation, but please check with MyChart first.

## 2022-12-22 NOTE — Progress Notes (Signed)
Chief Complaint: follow up right flank pain, chronic lbp, ckd3a, hyperglycemia       HPI:  [name] KindlerSandra S Gahan is a 72 y.o. female here with c/o persistent even worsening right flank pain distinct from her lower back pain, may have been somewhat improved with cleocin previously, but not resolved, now worst pain is 8/10.  Asks for tramadol only for pain, but also open to repeat urine tesitng, CT renal , and urology referral, female if possible.  Pt denies chest pain, increased sob or doe, wheezing, orthopnea, PND, increased LE swelling, palpitations, dizziness or syncope.   Pt denies polydipsia, polyuria, or new focal neuro s/s.   Denies urinary symptoms such as dysuria, frequency, urgency, hematuria or n/v, fever, chills.       Wt Readings from Last 3 Encounters:  12/22/22 148 lb (67.1 kg)  12/10/22 155 lb (70.3 kg)  12/01/22 155 lb (70.3 kg)   BP Readings from Last 3 Encounters:  12/22/22 128/84  12/01/22 130/82  11/26/22 130/78         Past Medical History:  Diagnosis Date   ASTHMA    BREAST CANCER, HX OF 11/1979   CKD (chronic kidney disease) stage 3, GFR 30-59 ml/min 03/05/2020   GERD    GI bleed 11/92, 11/93, 11/94   GI Bleed   Irritable bowel syndrome    MIGRAINE HEADACHE    Symptomatic anemia 08/24/2018   Past Surgical History:  Procedure Laterality Date   ABDOMINAL HYSTERECTOMY  11/1979   BREAST SURGERY     ESOPHAGOGASTRODUODENOSCOPY N/A 08/26/2018   Procedure: ESOPHAGOGASTRODUODENOSCOPY (EGD);  Surgeon: Sherrilyn Ristanis, Henry L III, MD;  [facility];  Service: Gastroenterology;  Laterality: N/A;   Left wrist  1963   MASTECTOMY  11/1979   Bilaterally & implants   OTHER SURGICAL HISTORY Bilateral    removal of breast implants   TONSILLECTOMY  1955   TUBAL LIGATION  1975    reports that she has never smoked. She has never used smokeless tobacco. She reports current alcohol use. She reports that she does not use drugs. family history includes Breast cancer in her  mother; Colon polyps (age of onset: 4564) in her sister; Coronary artery disease in her mother; Coronary artery disease (age of onset: 2250) in her father; Endometriosis in her sister; Heart disease in her brother; Hyperlipidemia in her brother; Hypertension in her brother; Irritable bowel syndrome in her sister; Uterine cancer in her mother. Allergies  Allergen Reactions   Albuterol Anaphylaxis    Pt states she has tolerated levalbuterol   Albuterol Sulfate Anaphylaxis    Pt states she has tolerated levalbuterol   Amoxicillin-Pot Clavulanate Anaphylaxis    Lips swell    Broccoli [Brassica Oleracea] Anaphylaxis   Covid-19 (Mrna) Vaccine Proofreader(Pfizer) [Covid-19 (Mrna) Vaccine] Anaphylaxis   Drug Ingredient [Acetylcysteine] Shortness Of Breath   Epipen [Epinephrine] Other (See Comments)    Allergic to preservatives in Epipen with caution!!!   Fluzone Anaphylaxis   Influenza Vaccines Anaphylaxis   Morphine Anaphylaxis   Penicillin G Anaphylaxis    All cillins All cillins   Povidone-Iodine Anaphylaxis and Hives   Sulfonamide Derivatives Anaphylaxis   Atrovent [Ipratropium] Other (See Comments)    Has sulfa in preservative   Ciprofloxacin Itching    Itchy palms   Hyoscyamine Sulfate Hives   Latex Itching   Meperidine Hcl Nausea And Vomiting    REACTION: N\T\V   Other Other (See Comments) and Swelling    Mucomist-unable to  breathe Arm swelling with red streaks   Pneumococcal Vaccines Other (See Comments)    Pt declines   Ppd [Tuberculin Purified Protein Derivative] Swelling and Other (See Comments)    Arm swelling with red streaks   Reglan [Metoclopramide] Other (See Comments)   Zostavax [Zoster Vaccine Live] Other (See Comments)    Pt declines   Pepcid [Famotidine] Nausea And Vomiting, Palpitations and Other (See Comments)    Due to sulfide preservative   Current Outpatient Medications on File Prior to Visit  Medication Sig Dispense Refill   Ascorbic Acid (VITAMIN C) 1000 MG tablet  Take 1,000 mg by mouth daily.     aspirin-acetaminophen-caffeine (EXCEDRIN MIGRAINE) 250-250-65 MG per tablet Take 1 tablet by mouth every 6 (six) hours as needed. For migraine     colchicine 0.6 MG tablet 1 tab by mouth every 4 hours until pain improved or diarrhea 40 tablet 3   Cyanocobalamin (VITAMIN B 12 PO) Take 1 tablet by mouth daily.     cyclobenzaprine (FLEXERIL) 10 MG tablet Take 1 tablet by mouth 3 times daily. Annual appt due in May must see provider for future refills 60 tablet 2   diazepam (VALIUM) 5 MG tablet 1/2 - 1 tab by mouth twice per day as needed 30 tablet 1   EPINEPHrine 0.3 mg/0.3 mL IJ SOAJ injection As directed for severe allergic reaction (Patient taking differently: Inject 0.3 mg into the muscle once as needed for anaphylaxis.) 2 each 12   HYDROcodone-acetaminophen (NORCO/VICODIN) 5-325 MG tablet Take 1 tablet by mouth every 6 (six) hours as needed. 30 tablet 0   levalbuterol (XOPENEX HFA) 45 MCG/ACT inhaler Inhale 1-2 puffs into the lungs every 6 (six) hours as needed. For shortness of breath 15 g 12   levalbuterol (XOPENEX) 1.25 MG/3ML nebulizer solution Take 1.25 mg by nebulization every 4 (four) hours as needed for wheezing. 72 mL 12   Melatonin 10 MG CAPS Take 10 mg by mouth at bedtime as needed (sleep).      mometasone-formoterol (DULERA) 200-5 MCG/ACT AERO Inhale 2 puffs into the lungs 2 (two) times daily. Rinse mouth 1 each 5   pantoprazole (PROTONIX) 40 MG tablet Take 1 tablet (40 mg total) by mouth daily. Annual appt due in March must see provider for future refills 90 tablet 0   triamcinolone ointment (KENALOG) 0.1 % Apply topically 2 (two) times daily. 30 g 0   vitamin E 400 UNIT capsule Take 400 Units by mouth daily.     zolpidem (AMBIEN) 10 MG tablet TAKE 1 TABLET BY MOUTH EVERY DAY 90 tablet 1   diphenhydrAMINE (BENADRYL) 25 MG tablet Take 1 tablet (25 mg total) by mouth every 6 (six) hours as needed for up to 5 days. 20 tablet 0   No current  facility-administered medications on file prior to visit.        ROS:  All others reviewed and negative.  Objective        PE:  BP 128/84   Pulse 63   Temp 97.7 F (36.5 C) (Oral)   Ht 5\' 3"  (1.6 m)   Wt 148 lb (67.1 kg)   SpO2 92%   BMI 26.22 kg/m                 Constitutional: Pt appears in NAD               HENT: Head: NCAT.  Right Ear: External ear normal.                 Left Ear: External ear normal.                Eyes: . Pupils are equal, round, and reactive to light. Conjunctivae and EOM are normal               Nose: without d/c or deformity               Neck: Neck supple. Gross normal ROM               Cardiovascular: Normal rate and regular rhythm.                 Pulmonary/Chest: Effort normal and breath sounds without rales or wheezing.                Abd:  Soft, NT, ND, + BS, no organomegaly but with mod right flank tender, also has lower lumbar chronic tender as well in midline               Neurological: Pt is alert. At baseline orientation, motor grossly intact               Skin: Skin is warm. No rashes, no other new lesions, LE edema - none               Psychiatric: Pt behavior is normal without agitation   Micro: none  Cardiac tracings I have personally interpreted today:  none  Pertinent Radiological findings (summarize): none   Lab Results  Component Value Date   WBC 11.4 (H) 12/01/2022   HGB 13.0 12/01/2022   HCT 39.1 12/01/2022   PLT 588.0 (H) 12/01/2022   GLUCOSE 97 12/01/2022   CHOL 231 (H) 10/06/2022   TRIG 299.0 (H) 10/06/2022   HDL 64.50 10/06/2022   LDLDIRECT 59.0 10/06/2022   LDLCALC 133 (H) 02/03/2017   ALT 13 12/01/2022   AST 16 12/01/2022   NA 137 12/01/2022   K 3.7 12/01/2022   CL 100 12/01/2022   CREATININE 1.23 (H) 12/01/2022   BUN 12 12/01/2022   CO2 26 12/01/2022   TSH 4.69 10/06/2022   INR 1.0 08/24/2018   HGBA1C 6.2 10/06/2022   Assessment/Plan:  EDI PEINADO is a 72 y.o. White or Caucasian [1]  female with  has a past medical history of ASTHMA, BREAST CANCER, HX OF (11/1979), CKD (chronic kidney disease) stage 3, GFR 30-59 ml/min (03/05/2020), GERD, GI bleed (11/92, 11/93, 11/94), Irritable bowel syndrome, MIGRAINE HEADACHE, and Symptomatic anemia (08/24/2018).  CKD (chronic kidney disease) stage 3, GFR 30-59 ml/min Lab Results  Component Value Date   CREATININE 1.23 (H) 12/01/2022   Stable overall, cont to avoid nephrotoxins  Hyperglycemia Lab Results  Component Value Date   HGBA1C 6.2 10/06/2022   Stable, pt to continue current medical treatment  - diet, wt control   Low back pain Chronic stable with worsening LE symptoms  Right flank pain Etiology unclear, ok for empiric repeat cleocin 300 tid, ua and culture, CT renal and refer urology   Followup: Return if symptoms worsen or fail to improve.  Oliver Barre, MD 12/22/2022 8:55 PM [facility] [place] [facility] - [facility]

## 2022-12-22 NOTE — Assessment & Plan Note (Signed)
Lab Results  Component Value Date   CREATININE 1.23 (H) 12/01/2022   Stable overall, cont to avoid nephrotoxins  

## 2022-12-23 ENCOUNTER — Ambulatory Visit
Admission: RE | Admit: 2022-12-23 | Discharge: 2022-12-23 | Disposition: A | Discharge status: Home / Self Care | Payer: Medicare Other | Source: Ambulatory Visit | Attending: Internal Medicine | Admitting: Internal Medicine

## 2022-12-23 ENCOUNTER — Other Ambulatory Visit: Payer: Self-pay | Admitting: Internal Medicine

## 2022-12-23 DIAGNOSIS — M5137 Other intervertebral disc degeneration, lumbosacral region: Secondary | ICD-10-CM | POA: Diagnosis not present

## 2022-12-23 DIAGNOSIS — K760 Fatty (change of) liver, not elsewhere classified: Secondary | ICD-10-CM | POA: Diagnosis not present

## 2022-12-23 DIAGNOSIS — S22070A Wedge compression fracture of T9-T10 vertebra, initial encounter for closed fracture: Secondary | ICD-10-CM

## 2022-12-23 DIAGNOSIS — K5730 Diverticulosis of large intestine without perforation or abscess without bleeding: Secondary | ICD-10-CM | POA: Diagnosis not present

## 2022-12-23 DIAGNOSIS — E2839 Other primary ovarian failure: Secondary | ICD-10-CM

## 2022-12-23 DIAGNOSIS — M4854XA Collapsed vertebra, not elsewhere classified, thoracic region, initial encounter for fracture: Secondary | ICD-10-CM | POA: Diagnosis not present

## 2022-12-23 DIAGNOSIS — R109 Unspecified abdominal pain: Secondary | ICD-10-CM

## 2022-12-24 LAB — URINE CULTURE

## 2022-12-26 IMAGING — DX DG CHEST 2V
2 series · 2 of 2 positions shown · non-contrast
Comparison: 08/24/2018

CLINICAL DATA: Persistent cough and wheezing for 2 weeks, dyspnea,
asthma, GERD

EXAM:
CHEST - 2 VIEW

[chest pa]
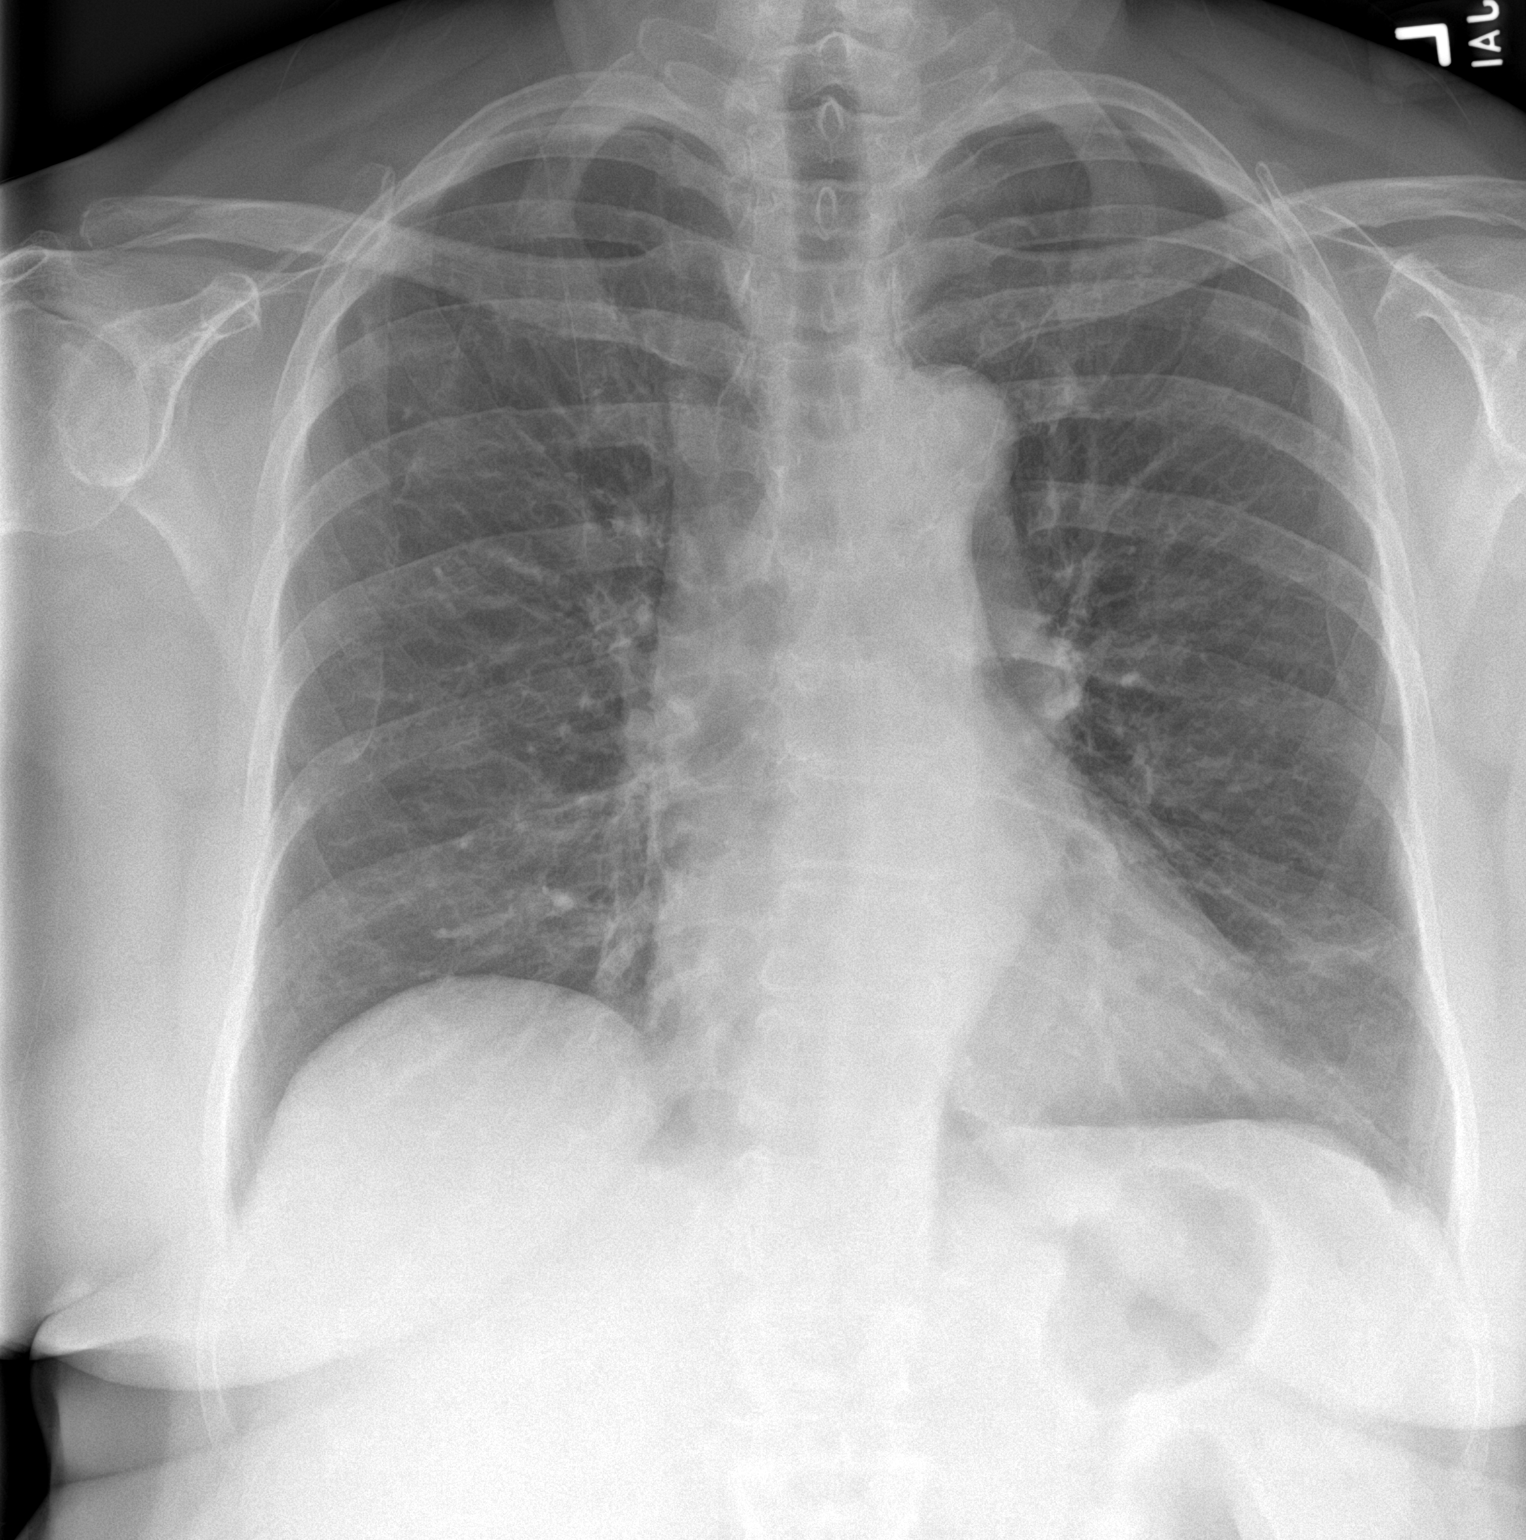

[chest lat]
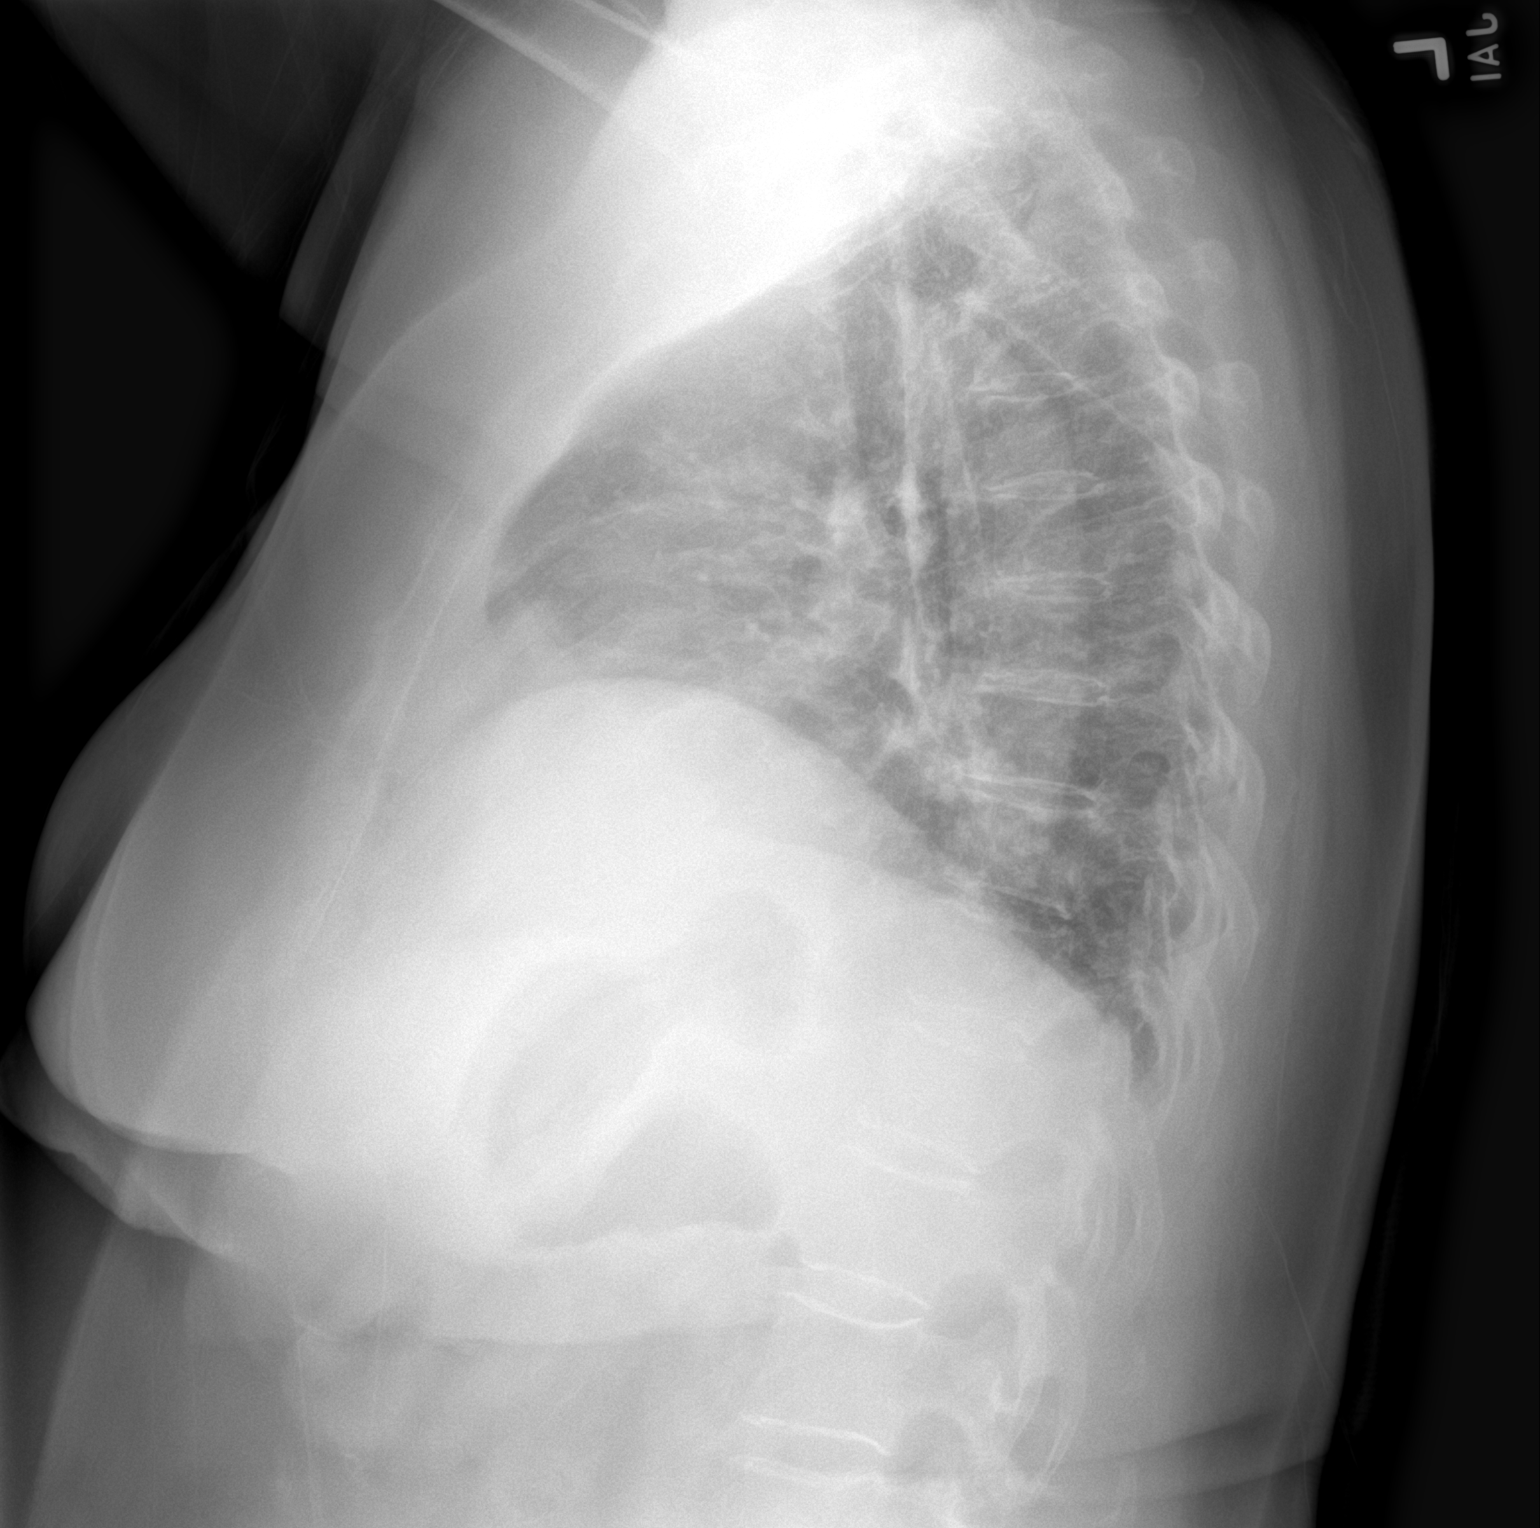

[2 of 2 positions shown; findings below may reference images not displayed]

FINDINGS: Normal heart size, mediastinal contours, and pulmonary vascularity.

Minimal residual LEFT basilar atelectasis, improved.

Lungs otherwise clear.

No acute infiltrate, pleural effusion, or pneumothorax.

Osseous structures unremarkable.
IMPRESSION: Minimal residual LEFT basilar atelectasis.

No acute abnormalities.

## 2022-12-30 ENCOUNTER — Other Ambulatory Visit: Payer: Self-pay | Admitting: Internal Medicine

## 2022-12-30 DIAGNOSIS — R5383 Other fatigue: Secondary | ICD-10-CM

## 2023-01-01 ENCOUNTER — Other Ambulatory Visit: Payer: Self-pay | Admitting: Internal Medicine

## 2023-01-01 MED ORDER — FENTANYL 12 MCG/HR TD PT72: 1.0000 | MEDICATED_PATCH | TRANSDERMAL | 0 refills | Status: DC

## 2023-01-04 ENCOUNTER — Ambulatory Visit (INDEPENDENT_AMBULATORY_CARE_PROVIDER_SITE_OTHER)
Admission: RE | Admit: 2023-01-04 | Discharge: 2023-01-04 | Disposition: A | Discharge status: Home / Self Care | Payer: Medicare Other | Source: Ambulatory Visit | Attending: Internal Medicine | Admitting: Internal Medicine

## 2023-01-04 DIAGNOSIS — E2839 Other primary ovarian failure: Secondary | ICD-10-CM

## 2023-01-05 DIAGNOSIS — S22070A Wedge compression fracture of T9-T10 vertebra, initial encounter for closed fracture: Secondary | ICD-10-CM | POA: Diagnosis not present

## 2023-01-28 DIAGNOSIS — S22070A Wedge compression fracture of T9-T10 vertebra, initial encounter for closed fracture: Secondary | ICD-10-CM | POA: Diagnosis not present

## 2023-02-15 DIAGNOSIS — H35363 Drusen (degenerative) of macula, bilateral: Secondary | ICD-10-CM | POA: Diagnosis not present

## 2023-02-15 DIAGNOSIS — H2513 Age-related nuclear cataract, bilateral: Secondary | ICD-10-CM | POA: Diagnosis not present

## 2023-02-15 DIAGNOSIS — H40013 Open angle with borderline findings, low risk, bilateral: Secondary | ICD-10-CM | POA: Diagnosis not present

## 2023-02-24 ENCOUNTER — Other Ambulatory Visit: Payer: Self-pay | Admitting: Internal Medicine

## 2023-03-11 DIAGNOSIS — S22070A Wedge compression fracture of T9-T10 vertebra, initial encounter for closed fracture: Secondary | ICD-10-CM | POA: Diagnosis not present

## 2023-04-06 ENCOUNTER — Ambulatory Visit: Payer: Medicare Other | Admitting: Internal Medicine

## 2023-04-06 ENCOUNTER — Encounter: Payer: Self-pay | Admitting: Internal Medicine

## 2023-04-06 VITALS — BP 120/76 | HR 90 | Temp 98.3°F | Ht 63.0 in | Wt 150.0 lb

## 2023-04-06 DIAGNOSIS — S22079A Unspecified fracture of T9-T10 vertebra, initial encounter for closed fracture: Secondary | ICD-10-CM | POA: Insufficient documentation

## 2023-04-06 DIAGNOSIS — R739 Hyperglycemia, unspecified: Secondary | ICD-10-CM

## 2023-04-06 DIAGNOSIS — N1831 Chronic kidney disease, stage 3a: Secondary | ICD-10-CM

## 2023-04-06 DIAGNOSIS — M545 Low back pain, unspecified: Secondary | ICD-10-CM

## 2023-04-06 DIAGNOSIS — E039 Hypothyroidism, unspecified: Secondary | ICD-10-CM

## 2023-04-06 DIAGNOSIS — M85859 Other specified disorders of bone density and structure, unspecified thigh: Secondary | ICD-10-CM | POA: Diagnosis not present

## 2023-04-06 LAB — HEPATIC FUNCTION PANEL
ALT: 19 U/L (ref 0–35)
AST: 22 U/L (ref 0–37)
Albumin: 4.3 g/dL (ref 3.5–5.2)
Alkaline Phosphatase: 66 U/L (ref 39–117)
Bilirubin, Direct: 0.1 mg/dL (ref 0.0–0.3)
Total Bilirubin: 0.4 mg/dL (ref 0.2–1.2)
Total Protein: 7.4 g/dL (ref 6.0–8.3)

## 2023-04-06 LAB — BASIC METABOLIC PANEL
BUN: 14 mg/dL (ref 6–23)
CO2: 29 mEq/L (ref 19–32)
Calcium: 9.9 mg/dL (ref 8.4–10.5)
Chloride: 102 mEq/L (ref 96–112)
Creatinine, Ser: 1.19 mg/dL (ref 0.40–1.20)
GFR: 45.08 mL/min — ABNORMAL LOW (ref >60.00)
Glucose, Bld: 94 mg/dL (ref 70–99)
Potassium: 3.9 mEq/L (ref 3.5–5.1)
Sodium: 140 mEq/L (ref 135–145)

## 2023-04-06 LAB — CBC WITH DIFFERENTIAL/PLATELET
Basophils Absolute: 0.1 10*3/uL (ref 0.0–0.1)
Basophils Relative: 1.1 % (ref 0.0–3.0)
Eosinophils Absolute: 0.2 10*3/uL (ref 0.0–0.7)
Eosinophils Relative: 2.8 % (ref 0.0–5.0)
HCT: 41.3 % (ref 36.0–46.0)
Hemoglobin: 13.5 g/dL (ref 12.0–15.0)
Lymphocytes Relative: 27.8 % (ref 12.0–46.0)
Lymphs Abs: 2.4 10*3/uL (ref 0.7–4.0)
MCHC: 32.7 g/dL (ref 30.0–36.0)
MCV: 86.5 fl (ref 78.0–100.0)
Monocytes Absolute: 0.6 10*3/uL (ref 0.1–1.0)
Monocytes Relative: 7.1 % (ref 3.0–12.0)
Neutro Abs: 5.3 10*3/uL (ref 1.4–7.7)
Neutrophils Relative %: 61.2 % (ref 43.0–77.0)
Platelets: 534 10*3/uL — ABNORMAL HIGH (ref 150.0–400.0)
RBC: 4.77 Mil/uL (ref 3.87–5.11)
RDW: 15.3 % (ref 11.5–15.5)
WBC: 8.7 10*3/uL (ref 4.0–10.5)

## 2023-04-06 LAB — LIPID PANEL
Cholesterol: 257 mg/dL — ABNORMAL HIGH (ref 0–200)
HDL: 58.6 mg/dL (ref >39.00)
NonHDL: 198.3
Total CHOL/HDL Ratio: 4
Triglycerides: 328 mg/dL — ABNORMAL HIGH (ref 0.0–149.0)
VLDL: 65.6 mg/dL — ABNORMAL HIGH (ref 0.0–40.0)

## 2023-04-06 LAB — TSH: TSH: 4.61 u[IU]/mL (ref 0.35–5.50)

## 2023-04-06 LAB — LDL CHOLESTEROL, DIRECT: Direct LDL: 80 mg/dL

## 2023-04-06 LAB — HEMOGLOBIN A1C: Hgb A1c MFr Bld: 6.2 % (ref 4.6–6.5)

## 2023-04-06 NOTE — Assessment & Plan Note (Signed)
Lab Results  Component Value Date   TSH 4.61 04/06/2023   Stable, pt to continue on no med replacement fo rnow

## 2023-04-06 NOTE — Assessment & Plan Note (Signed)
Lab Results  Component Value Date   CREATININE 1.19 04/06/2023   Stable overall, cont to avoid nephrotoxins

## 2023-04-06 NOTE — Progress Notes (Signed)
Patient ID: [name] [name], female   DOB: [birth year], 72 y.o.   MRN: [id number]        Chief Complaint: follow up osteopenia and t10 compression fx, ckd, hyperglycemia, low thyroid       HPI:  [name] [name] is a 72 y.o. female here overall doing ok,  Pt continues to have recurring LBP without change in severity, bowel or bladder change, fever, wt loss,  worsening LE pain/numbness/weakness, gait change or falls, but still has marked tender at the t10 compression fx area mid and lower back.  Has delcines kyphoplasty so far.  Pt denies chest pain, increased sob or doe, wheezing, orthopnea, PND, increased LE swelling, palpitations, dizziness or syncope.   Pt denies polydipsia, polyuria, or new focal neuro s/s.    Pt denies fever, wt loss, night sweats, loss of appetite, or other constitutional symptoms          Wt Readings from Last 3 Encounters:  04/06/23 150 lb (68 kg)  12/22/22 148 lb (67.1 kg)  12/10/22 155 lb (70.3 kg)   BP Readings from Last 3 Encounters:  04/06/23 120/76  12/22/22 128/84  12/01/22 130/82         Past Medical History:  Diagnosis Date   ASTHMA    BREAST CANCER, HX OF 11/1979   CKD (chronic kidney disease) stage 3, GFR 30-59 ml/min (HCC) 03/05/2020   GERD    GI bleed 11/92, 11/93, 11/94   GI Bleed   Irritable bowel syndrome    MIGRAINE HEADACHE    Symptomatic anemia 08/24/2018   Past Surgical History:  Procedure Laterality Date   ABDOMINAL HYSTERECTOMY  11/1979   BREAST SURGERY     ESOPHAGOGASTRODUODENOSCOPY N/A 08/26/2018   Procedure: ESOPHAGOGASTRODUODENOSCOPY (EGD);  Surgeon: Sherrilyn Rist, MD;  Location: [facility] ENDOSCOPY;  Service: Gastroenterology;  Laterality: N/A;   Left wrist  1963   MASTECTOMY  11/1979   Bilaterally & implants   OTHER SURGICAL HISTORY Bilateral    removal of breast implants   TONSILLECTOMY  1955   TUBAL LIGATION  1975    reports that she has never smoked. She has never used smokeless tobacco. She reports current alcohol use. She  reports that she does not use drugs. family history includes Breast cancer in her mother; Colon polyps (age of onset: 29) in her sister; Coronary artery disease in her mother; Coronary artery disease (age of onset: 26) in her father; Endometriosis in her sister; Heart disease in her brother; Hyperlipidemia in her brother; Hypertension in her brother; Irritable bowel syndrome in her sister; Uterine cancer in her mother. Allergies  Allergen Reactions   Albuterol Anaphylaxis    Pt states she has tolerated levalbuterol   Albuterol Sulfate Anaphylaxis    Pt states she has tolerated levalbuterol   Amoxicillin-Pot Clavulanate Anaphylaxis    Lips swell    Broccoli [Brassica Oleracea] Anaphylaxis   Covid-19 (Mrna) Vaccine Proofreader) [Covid-19 (Mrna) Vaccine] Anaphylaxis   Drug Ingredient [Acetylcysteine] Shortness Of Breath   Epipen [Epinephrine] Other (See Comments)    Allergic to preservatives in Epipen with caution!!!   Fluzone Anaphylaxis   Influenza Vaccines Anaphylaxis   Morphine Anaphylaxis   Penicillin G Anaphylaxis    All cillins All cillins   Povidone-Iodine Anaphylaxis and Hives   Sulfonamide Derivatives Anaphylaxis   Atrovent [Ipratropium] Other (See Comments)    Has sulfa in preservative   Ciprofloxacin Itching    Itchy palms   Hyoscyamine Sulfate Hives   Latex Itching  Meperidine Hcl Nausea And Vomiting    REACTION: N\T\V   Other Other (See Comments) and Swelling    Mucomist-unable to breathe Arm swelling with red streaks   Pneumococcal Vaccines Other (See Comments)    Pt declines   Ppd [Tuberculin Purified Protein Derivative] Swelling and Other (See Comments)    Arm swelling with red streaks   Reglan [Metoclopramide] Other (See Comments)   Zostavax [Zoster Vaccine Live] Other (See Comments)    Pt declines   Pepcid [Famotidine] Nausea And Vomiting, Palpitations and Other (See Comments)    Due to sulfide preservative   Current Outpatient Medications on File Prior to  Visit  Medication Sig Dispense Refill   Ascorbic Acid (VITAMIN C) 1000 MG tablet Take 1,000 mg by mouth daily.     aspirin-acetaminophen-caffeine (EXCEDRIN MIGRAINE) 250-250-65 MG per tablet Take 1 tablet by mouth every 6 (six) hours as needed. For migraine     Cyanocobalamin (VITAMIN B 12 PO) Take 1 tablet by mouth daily.     cyclobenzaprine (FLEXERIL) 10 MG tablet TAKE 1 TABLET BY MOUTH 3 TIMES DAILY. must see provider in MAY FOR future refills 60 tablet 2   EPINEPHrine 0.3 mg/0.3 mL IJ SOAJ injection As directed for severe allergic reaction (Patient taking differently: Inject 0.3 mg into the muscle once as needed for anaphylaxis.) 2 each 12   levalbuterol (XOPENEX HFA) 45 MCG/ACT inhaler Inhale 1-2 puffs into the lungs every 6 (six) hours as needed. For shortness of breath 15 g 12   levalbuterol (XOPENEX) 1.25 MG/3ML nebulizer solution Take 1.25 mg by nebulization every 4 (four) hours as needed for wheezing. 72 mL 12   Melatonin 10 MG CAPS Take 10 mg by mouth at bedtime as needed (sleep).      mometasone-formoterol (DULERA) 200-5 MCG/ACT AERO Inhale 2 puffs into the lungs 2 (two) times daily. Rinse mouth 1 each 5   pantoprazole (PROTONIX) 40 MG tablet Take 1 tablet (40 mg total) by mouth daily. 90 tablet 1   traMADol (ULTRAM) 50 MG tablet Take 1 tablet (50 mg total) by mouth every 6 (six) hours as needed. 60 tablet 1   triamcinolone ointment (KENALOG) 0.1 % Apply topically 2 (two) times daily. 30 g 0   vitamin E 400 UNIT capsule Take 400 Units by mouth daily.     zolpidem (AMBIEN) 10 MG tablet TAKE 1 TABLET BY MOUTH EVERY DAY 90 tablet 1   diphenhydrAMINE (BENADRYL) 25 MG tablet Take 1 tablet (25 mg total) by mouth every 6 (six) hours as needed for up to 5 days. 20 tablet 0   No current facility-administered medications on file prior to visit.        ROS:  All others reviewed and negative.  Objective        PE:  BP 120/76 (BP Location: Right Arm, Patient Position: Sitting, Cuff Size:  Normal)   Pulse 90   Temp 98.3 F (36.8 C) (Oral)   Ht 5\' 3"  (1.6 m)   Wt 150 lb (68 kg)   SpO2 97%   BMI 26.57 kg/m                 Constitutional: Pt appears in NAD               HENT: Head: NCAT.                Right Ear: External ear normal.  Left Ear: External ear normal.                Eyes: . Pupils are equal, round, and reactive to light. Conjunctivae and EOM are normal               Nose: without d/c or deformity               Neck: Neck supple. Gross normal ROM               Cardiovascular: Normal rate and regular rhythm.                 Pulmonary/Chest: Effort normal and breath sounds without rales or wheezing.                Abd:  Soft, NT, ND, + BS, no organomegaly               Neurological: Pt is alert. At baseline orientation, motor grossly intact               Skin: Skin is warm. No rashes, no other new lesions, LE edema - none               Psychiatric: Pt behavior is normal without agitation   Micro: none  Cardiac tracings I have personally interpreted today:  none  Pertinent Radiological findings (summarize): none   Lab Results  Component Value Date   WBC 8.7 04/06/2023   HGB 13.5 04/06/2023   HCT 41.3 04/06/2023   PLT 534.0 (H) 04/06/2023   GLUCOSE 94 04/06/2023   CHOL 257 (H) 04/06/2023   TRIG 328.0 (H) 04/06/2023   HDL 58.60 04/06/2023   LDLDIRECT 80.0 04/06/2023   LDLCALC 133 (H) 02/03/2017   ALT 19 04/06/2023   AST 22 04/06/2023   NA 140 04/06/2023   K 3.9 04/06/2023   CL 102 04/06/2023   CREATININE 1.19 04/06/2023   BUN 14 04/06/2023   CO2 29 04/06/2023   TSH 4.61 04/06/2023   INR 1.0 08/24/2018   HGBA1C 6.2 04/06/2023   Assessment/Plan:  [name] [name] is a 72 y.o. White or Caucasian [1] female with  has a past medical history of ASTHMA, BREAST CANCER, HX OF (11/1979), CKD (chronic kidney disease) stage 3, GFR 30-59 ml/min (HCC) (03/05/2020), GERD, GI bleed (11/92, 11/93, 11/94), Irritable bowel syndrome, MIGRAINE  HEADACHE, and Symptomatic anemia (08/24/2018).  CKD (chronic kidney disease) stage 3, GFR 30-59 ml/min Lab Results  Component Value Date   CREATININE 1.19 04/06/2023   Stable overall, cont to avoid nephrotoxins   Hyperglycemia Lab Results  Component Value Date   HGBA1C 6.2 04/06/2023   Stable, pt to continue current medical treatment  - diet,w t control   Hypothyroidism Lab Results  Component Value Date   TSH 4.61 04/06/2023   Stable, pt to continue on no med replacement fo rnow   Low back pain Chronic after t10 compresion fx, to f/u NS as planned, declines other pain med control for now  Osteopenia D/w pt  - mild worsening, declines fosamax or similar due to risk of side effect  Followup: Return in about 6 months (around 10/07/2023).  Oliver Barre, MD 04/06/2023 9:19 PM [facility]

## 2023-04-06 NOTE — Assessment & Plan Note (Signed)
Chronic after t10 compresion fx, to f/u NS as planned, declines other pain med control for now

## 2023-04-06 NOTE — Assessment & Plan Note (Signed)
Lab Results  Component Value Date   HGBA1C 6.2 04/06/2023   Stable, pt to continue current medical treatment  - diet,w t control

## 2023-04-06 NOTE — Assessment & Plan Note (Signed)
D/w pt  - mild worsening, declines fosamax or similar due to risk of side effect

## 2023-04-06 NOTE — Patient Instructions (Signed)
Please continue all other medications as before, and refills have been done if requested.  Please have the pharmacy call with any other refills you may need.  Please continue your efforts at being more active, low cholesterol diet, and weight control.  Please keep your appointments with your specialists as you may have planned  Please go to the LAB at the blood drawing area for the tests to be done

## 2023-04-06 NOTE — Progress Notes (Signed)
The test results show that your current treatment is OK, as the tests are stable.  Please continue the same plan.  There is no other need for change of treatment or further evaluation based on these results, at this time.  thanks 

## 2023-04-19 ENCOUNTER — Telehealth: Payer: Self-pay

## 2023-04-19 DIAGNOSIS — M533 Sacrococcygeal disorders, not elsewhere classified: Secondary | ICD-10-CM

## 2023-04-19 NOTE — Telephone Encounter (Signed)
Order for repeat right SI joint placed

## 2023-04-19 NOTE — Telephone Encounter (Signed)
Spoke with patient and she is requesting a SI joint injection. Last injection on 06/18/22 lasted until recently and she had more than 75% relief during that time. No new falls, accidents or injuries affecting SI. Please advise

## 2023-04-19 NOTE — Addendum Note (Signed)
Addended by: Sharlet Salina on: 04/19/2023 10:58 AM   Modules accepted: Orders

## 2023-04-27 DIAGNOSIS — S22070A Wedge compression fracture of T9-T10 vertebra, initial encounter for closed fracture: Secondary | ICD-10-CM | POA: Diagnosis not present

## 2023-05-03 ENCOUNTER — Other Ambulatory Visit: Payer: Self-pay

## 2023-05-03 ENCOUNTER — Ambulatory Visit: Payer: Medicare Other | Admitting: Physical Medicine and Rehabilitation

## 2023-05-03 DIAGNOSIS — M461 Sacroiliitis, not elsewhere classified: Secondary | ICD-10-CM

## 2023-05-03 NOTE — Progress Notes (Signed)
Functional Pain Scale - descriptive words and definitions  Distracting (5)    Aware of pain/able to complete some ADL's but limited by pain/sleep is affected and active distractions are only slightly useful. Moderate range order  Average Pain 4   +Driver, -BT, +Dye Allergies.  Lower back pain with radiation in the right leg. Tramadol and acupuncture has helped pain

## 2023-05-14 MED ORDER — BUPIVACAINE HCL 0.5 % IJ SOLN
2.0000 mL | INTRAMUSCULAR | Status: AC | PRN
Start: 2023-05-03 — End: 2023-05-03
  Administered 2023-05-03: 2 mL via INTRA_ARTICULAR

## 2023-05-14 MED ORDER — METHYLPREDNISOLONE ACETATE 80 MG/ML IJ SUSP
80.0000 mg | INTRAMUSCULAR | Status: AC | PRN
Start: 2023-05-03 — End: 2023-05-03
  Administered 2023-05-03: 80 mg via INTRA_ARTICULAR

## 2023-05-14 MED ORDER — METHYLPREDNISOLONE ACETATE 40 MG/ML IJ SUSP
40.0000 mg | INTRAMUSCULAR | Status: AC | PRN
Start: 2023-05-03 — End: 2023-05-03
  Administered 2023-05-03: 40 mg via INTRA_ARTICULAR

## 2023-05-14 NOTE — Progress Notes (Signed)
DONIECE AZURDIA - 72 y.o. female MRN [id number]  Date of birth: 24-Jan-1951  Office Visit Note: Visit Date: 05/03/2023 PCP: Corwin Levins, MD Referred by: Coletta Memos, MD  Subjective: Chief Complaint  Patient presents with   Lower Back - Pain   HPI:  CESILIA AMIE is a 72 y.o. female who comes in today at the request of Ellin Goodie, FNP and Dr. Doneen Poisson for planned Right anesthetic Sacroiliac joint arthrogram with fluoroscopic guidance.  The patient has failed conservative care including home exercise, medications, time and activity modification.  This injection will be diagnostic and hopefully therapeutic.  Please see requesting physician notes for further details and justification.  She has allergy to iodine/contrast. If no help consider Lumbar MRI. Notes to Dr. Franky Macho, managing her thoracic compression fracture.  Positive Fortin finger sign, Patrick's testing,and lateral compression test.     ROS Otherwise per HPI.  Assessment & Plan: Visit Diagnoses:    ICD-10-CM   1. Sacroiliitis (HCC)  M46.1 XR C-ARM NO REPORT      Plan: No additional findings.   Meds & Orders: No orders of the defined types were placed in this encounter.   Orders Placed This Encounter  Procedures   Sacroiliac Joint Inj   XR C-ARM NO REPORT    Follow-up: Return for visit to requesting provider as needed.   Procedures: Sacroiliac Joint Inj on 05/03/2023 8:30 AM Indications: pain and diagnostic evaluation Details: 22 G 3.5 in needle, fluoroscopy-guided posterior approach Medications: 2 mL bupivacaine 0.5 %; 80 mg methylPREDNISolone acetate 80 MG/ML; 40 mg methylPREDNISolone acetate 40 MG/ML Outcome: tolerated well, no immediate complications  Sacroiliac Joint Intra-Articular Injection - Posterior Approach with Fluoroscopic Guidance   Position: PRONE  Additional Comments: Vital signs were monitored before and after the procedure. Patient was prepped and draped in the usual  sterile fashion. The correct patient, procedure, and site was verified.   Injection Procedure Details:   Location/Site:  Sacroiliac joint  Needle size: 3.5 in Spinal Needle  Needle type: Spinal  Needle Placement: Intra-articular  Findings:  -Comments: There was excellent flow of contrast producing a partial arthrogram of the sacroiliac joint.   Procedure Details: Starting with a 90 degree vertical and midline orientation the fluoroscope was tilted cranially 20 to 25 degrees and the target area of the inferior most part of the SI joint on the side mentioned above was visualized.  The soft tissues overlying this target were infiltrated with 4 ml. of 1% Lidocaine without Epinephrine. A #22 gauge spinal needle was inserted perpendicular to the fluoroscope table and advanced into the posterior inferior joint space using fluoroscopic guidance.  Position in the joint space was confirmed by obtaining bi planar imaging. No contrast due to allergy. After negative aspirate for gross pus or blood, the injectate was delivered to the joint. Radiographs were obtained for documentation purposes.   Additional Comments:   Dressing: Bandaid    Post-procedure details: Patient was observed during the procedure. Post-procedure instructions were reviewed.  Patient left the clinic in stable condition.    There was excellent flow of contrast producing a partial arthrogram of the sacroiliac joint.  Procedure, treatment alternatives, risks and benefits explained, specific risks discussed. Consent was given by the patient. Immediately prior to procedure a time out was called to verify the correct patient, procedure, equipment, support staff and site/side marked as required. Patient was prepped and draped in the usual sterile fashion.  Clinical History: No specialty comments available.     Objective:  VS:  HT:    WT:   BMI:     BP:   HR: bpm  TEMP: ( )  RESP:  Physical Exam    Imaging: No results found.

## 2023-06-02 ENCOUNTER — Other Ambulatory Visit: Payer: Self-pay | Admitting: Internal Medicine

## 2023-06-02 ENCOUNTER — Other Ambulatory Visit: Payer: Self-pay

## 2023-06-02 DIAGNOSIS — R5383 Other fatigue: Secondary | ICD-10-CM

## 2023-07-16 ENCOUNTER — Other Ambulatory Visit: Payer: Self-pay | Admitting: Internal Medicine

## 2023-08-26 ENCOUNTER — Ambulatory Visit (INDEPENDENT_AMBULATORY_CARE_PROVIDER_SITE_OTHER): Payer: Medicare Other | Admitting: Family Medicine

## 2023-08-26 ENCOUNTER — Encounter: Payer: Self-pay | Admitting: Family Medicine

## 2023-08-26 ENCOUNTER — Ambulatory Visit: Payer: Medicare Other | Admitting: Internal Medicine

## 2023-08-26 VITALS — BP 120/72 | HR 94 | Temp 97.7°F | Ht 63.0 in | Wt 153.0 lb

## 2023-08-26 DIAGNOSIS — J4520 Mild intermittent asthma, uncomplicated: Secondary | ICD-10-CM | POA: Diagnosis not present

## 2023-08-26 DIAGNOSIS — R051 Acute cough: Secondary | ICD-10-CM

## 2023-08-26 MED ORDER — DULERA 200-5 MCG/ACT IN AERO: 2.0000 | INHALATION_SPRAY | Freq: Two times a day (BID) | RESPIRATORY_TRACT | 0 refills | Status: DC

## 2023-08-26 MED ORDER — LEVALBUTEROL TARTRATE 45 MCG/ACT IN AERO: 1.0000 | INHALATION_SPRAY | Freq: Four times a day (QID) | RESPIRATORY_TRACT | 1 refills | Status: DC | PRN

## 2023-08-26 MED ORDER — BENZONATATE 200 MG PO CAPS: 200.0000 mg | ORAL_CAPSULE | Freq: Two times a day (BID) | ORAL | 0 refills | Status: DC | PRN

## 2023-08-26 NOTE — Patient Instructions (Signed)
Continue using your Dulera inhaler and Xopenex inhaler.  I refilled your Tessalon Perles for cough.  Hopefully you will continue improving on your own.  Follow-up with [place] if not.

## 2023-08-26 NOTE — Progress Notes (Signed)
Subjective:  [name] [name] is a 72 y.o. female who presents for a 5 day hx of a dry cough. No fever, chest pain, shortness of breath.   ROS as in subjective.   Objective: Vitals:   08/26/23 1456  BP: 120/72  Pulse: 94  Temp: 97.7 F (36.5 C)  SpO2: 98%    General appearance: Alert, WD/WN, no distress, mildly ill appearing                             Skin: warm, no rash                           Head: no sinus tenderness                            Eyes: conjunctiva normal, corneas clear, PERRLA                            Ears: pearly TMs, external ear canals normal                          Nose: septum midline, turbinates swollen, with erythema and clear discharge             Mouth/throat: MMM, tongue normal, mild pharyngeal erythema                           Neck: supple, no adenopathy, no thyromegaly, nontender                          Heart: RRR, normal S1, S2, no murmurs                         Lungs: CTA bilaterally, no wheezes, rales, or rhonchi      Assessment: Acute cough - Plan: levalbuterol (XOPENEX HFA) 45 MCG/ACT inhaler, mometasone-formoterol (DULERA) 200-5 MCG/ACT AERO, benzonatate (TESSALON) 200 MG capsule  Mild intermittent asthma without complication - Plan: levalbuterol (XOPENEX HFA) 45 MCG/ACT inhaler, mometasone-formoterol (DULERA) 200-5 MCG/ACT AERO   Plan: Covid test negative at home.  Reports being at least 50% improved.  Refilled Dulera which she takes when sick but normally does not need. Refilled Xopenex inhaler.  Refilled Tessalon.  Continue with symptomatic treatment. Follow up if worsening.

## 2023-08-27 ENCOUNTER — Other Ambulatory Visit: Payer: Self-pay

## 2023-08-27 ENCOUNTER — Other Ambulatory Visit: Payer: Self-pay | Admitting: Internal Medicine

## 2023-10-06 ENCOUNTER — Ambulatory Visit: Payer: Medicare Other | Admitting: Internal Medicine

## 2023-10-06 ENCOUNTER — Encounter: Payer: Self-pay | Admitting: Internal Medicine

## 2023-10-06 VITALS — BP 120/84 | HR 90 | Ht 63.0 in | Wt 154.0 lb

## 2023-10-06 DIAGNOSIS — N1831 Chronic kidney disease, stage 3a: Secondary | ICD-10-CM

## 2023-10-06 DIAGNOSIS — R9431 Abnormal electrocardiogram [ECG] [EKG]: Secondary | ICD-10-CM

## 2023-10-06 DIAGNOSIS — D509 Iron deficiency anemia, unspecified: Secondary | ICD-10-CM

## 2023-10-06 DIAGNOSIS — R739 Hyperglycemia, unspecified: Secondary | ICD-10-CM

## 2023-10-06 DIAGNOSIS — E781 Pure hyperglyceridemia: Secondary | ICD-10-CM | POA: Diagnosis not present

## 2023-10-06 DIAGNOSIS — E559 Vitamin D deficiency, unspecified: Secondary | ICD-10-CM

## 2023-10-06 DIAGNOSIS — Z0001 Encounter for general adult medical examination with abnormal findings: Secondary | ICD-10-CM

## 2023-10-06 DIAGNOSIS — E538 Deficiency of other specified B group vitamins: Secondary | ICD-10-CM | POA: Diagnosis not present

## 2023-10-06 DIAGNOSIS — Z0000 Encounter for general adult medical examination without abnormal findings: Secondary | ICD-10-CM | POA: Diagnosis not present

## 2023-10-06 DIAGNOSIS — E039 Hypothyroidism, unspecified: Secondary | ICD-10-CM | POA: Diagnosis not present

## 2023-10-06 LAB — CBC WITH DIFFERENTIAL/PLATELET
Basophils Absolute: 0.1 10*3/uL (ref 0.0–0.1)
Basophils Relative: 1.4 % (ref 0.0–3.0)
Eosinophils Absolute: 0.4 10*3/uL (ref 0.0–0.7)
Eosinophils Relative: 5 % (ref 0.0–5.0)
HCT: 43.7 % (ref 36.0–46.0)
Hemoglobin: 14.2 g/dL (ref 12.0–15.0)
Lymphocytes Relative: 37.6 % (ref 12.0–46.0)
Lymphs Abs: 2.7 10*3/uL (ref 0.7–4.0)
MCHC: 32.5 g/dL (ref 30.0–36.0)
MCV: 87.6 fL (ref 78.0–100.0)
Monocytes Absolute: 0.5 10*3/uL (ref 0.1–1.0)
Monocytes Relative: 7.6 % (ref 3.0–12.0)
Neutro Abs: 3.4 10*3/uL (ref 1.4–7.7)
Neutrophils Relative %: 48.4 % (ref 43.0–77.0)
Platelets: 493 10*3/uL — ABNORMAL HIGH (ref 150.0–400.0)
RBC: 4.99 Mil/uL (ref 3.87–5.11)
RDW: 13.9 % (ref 11.5–15.5)
WBC: 7.1 10*3/uL (ref 4.0–10.5)

## 2023-10-06 LAB — LIPID PANEL
Cholesterol: 232 mg/dL — ABNORMAL HIGH (ref 0–200)
HDL: 66.5 mg/dL (ref >39.00)
LDL Cholesterol: 110 mg/dL — ABNORMAL HIGH (ref 0–99)
NonHDL: 165.7
Total CHOL/HDL Ratio: 3
Triglycerides: 279 mg/dL — ABNORMAL HIGH (ref 0.0–149.0)
VLDL: 55.8 mg/dL — ABNORMAL HIGH (ref 0.0–40.0)

## 2023-10-06 LAB — HEPATIC FUNCTION PANEL
ALT: 22 U/L (ref 0–35)
AST: 22 U/L (ref 0–37)
Albumin: 4.7 g/dL (ref 3.5–5.2)
Alkaline Phosphatase: 64 U/L (ref 39–117)
Bilirubin, Direct: 0 mg/dL (ref 0.0–0.3)
Total Bilirubin: 0.4 mg/dL (ref 0.2–1.2)
Total Protein: 7.7 g/dL (ref 6.0–8.3)

## 2023-10-06 LAB — TSH: TSH: 4.11 u[IU]/mL (ref 0.35–5.50)

## 2023-10-06 LAB — BASIC METABOLIC PANEL
BUN: 20 mg/dL (ref 6–23)
CO2: 32 meq/L (ref 19–32)
Calcium: 11.1 mg/dL — ABNORMAL HIGH (ref 8.4–10.5)
Chloride: 101 meq/L (ref 96–112)
Creatinine, Ser: 1.07 mg/dL (ref 0.40–1.20)
GFR: 51.03 mL/min — ABNORMAL LOW (ref >60.00)
Glucose, Bld: 103 mg/dL — ABNORMAL HIGH (ref 70–99)
Potassium: 4 meq/L (ref 3.5–5.1)
Sodium: 141 meq/L (ref 135–145)

## 2023-10-06 LAB — FERRITIN: Ferritin: 18.2 ng/mL (ref 10.0–291.0)

## 2023-10-06 LAB — IBC PANEL
Iron: 86 ug/dL (ref 42–145)
Saturation Ratios: 17 % — ABNORMAL LOW (ref 20.0–50.0)
TIBC: 505.4 ug/dL — ABNORMAL HIGH (ref 250.0–450.0)
Transferrin: 361 mg/dL — ABNORMAL HIGH (ref 212.0–360.0)

## 2023-10-06 LAB — VITAMIN B12: Vitamin B-12: >1537 pg/mL — ABNORMAL HIGH (ref 211–911)

## 2023-10-06 LAB — VITAMIN D 25 HYDROXY (VIT D DEFICIENCY, FRACTURES): VITD: 36.72 ng/mL (ref 30.00–100.00)

## 2023-10-06 LAB — HEMOGLOBIN A1C: Hgb A1c MFr Bld: 6.3 % (ref 4.6–6.5)

## 2023-10-06 NOTE — Patient Instructions (Signed)
Please continue all other medications as before, and refills have been done if requested.  Please have the pharmacy call with any other refills you may need.  Please continue your efforts at being more active, low cholesterol diet, and weight control.  You are otherwise up to date with prevention measures today.  Please keep your appointments with your specialists as you may have planned  You will be contacted regarding the referral for: Cardiac CT score  Please go to the LAB at the blood drawing area for the tests to be done  You will be contacted by phone if any changes need to be made immediately.  Otherwise, you will receive a letter about your results with an explanation, but please check with MyChart first.  Please make an Appointment to return in 6 months, or sooner if needed

## 2023-10-06 NOTE — Progress Notes (Signed)
Patient ID: [name] [name], female   DOB: May 25, 1951, 73 y.o.   MRN: [id number]         Chief Complaint:: wellness exam and ckd3a, hyperglycemia, hld, low thryoid, iron def anemia,        HPI:  [name] [name] is a 73 y.o. female here for wellness exam; up to date                Also Pt denies chest pain, increased sob or doe, wheezing, orthopnea, PND, increased LE swelling, palpitations, dizziness or syncope. Pt willing for cardiac CT score,   Pt denies polydipsia, polyuria, or new focal neuro s/s.    Pt denies fever, wt loss, night sweats, loss of appetite, or other constitutional symptoms  Denies hyper or hypo thyroid symptoms such as voice, skin or hair change.     Wt Readings from Last 3 Encounters:  10/06/23 154 lb (69.9 kg)  08/26/23 153 lb (69.4 kg)  04/06/23 150 lb (68 kg)   BP Readings from Last 3 Encounters:  10/06/23 120/84  08/26/23 120/72  04/06/23 120/76   Immunization History  Administered Date(s) Administered   Influenza Whole 09/14/2006   PFIZER(Purple Top)SARS-COV-2 Vaccination 06/04/2020   Td 09/15/2007  There are no preventive care reminders to display for this patient.    Past Medical History:  Diagnosis Date   ASTHMA    BREAST CANCER, HX OF 11/1979   CKD (chronic kidney disease) stage 3, GFR 30-59 ml/min (HCC) 03/05/2020   GERD    GI bleed 11/92, 11/93, 11/94   GI Bleed   Irritable bowel syndrome    MIGRAINE HEADACHE    Symptomatic anemia 08/24/2018   Past Surgical History:  Procedure Laterality Date   ABDOMINAL HYSTERECTOMY  11/1979   BREAST SURGERY     ESOPHAGOGASTRODUODENOSCOPY N/A 08/26/2018   Procedure: ESOPHAGOGASTRODUODENOSCOPY (EGD);  Surgeon: Sherrilyn Rist, MD;  Location: [facility] ENDOSCOPY;  Service: Gastroenterology;  Laterality: N/A;   Left wrist  1963   MASTECTOMY  11/1979   Bilaterally & implants   OTHER SURGICAL HISTORY Bilateral    removal of breast implants   TONSILLECTOMY  1955   TUBAL LIGATION  1975    reports that she has never  smoked. She has never used smokeless tobacco. She reports current alcohol use. She reports that she does not use drugs. family history includes Breast cancer in her mother; Colon polyps (age of onset: 70) in her sister; Coronary artery disease in her mother; Coronary artery disease (age of onset: 38) in her father; Endometriosis in her sister; Heart disease in her brother; Hyperlipidemia in her brother; Hypertension in her brother; Irritable bowel syndrome in her sister; Uterine cancer in her mother. Allergies  Allergen Reactions   Albuterol Anaphylaxis    Pt states she has tolerated levalbuterol   Albuterol Sulfate Anaphylaxis    Pt states she has tolerated levalbuterol   Amoxicillin-Pot Clavulanate Anaphylaxis    Lips swell    Broccoli [Brassica Oleracea] Anaphylaxis   Covid-19 (Mrna) Vaccine Proofreader) [Covid-19 (Mrna) Vaccine] Anaphylaxis   Drug Ingredient [Acetylcysteine] Shortness Of Breath   Epipen [Epinephrine] Other (See Comments)    Allergic to preservatives in Epipen with caution!!!   Fluzone Anaphylaxis   Influenza Vaccines Anaphylaxis   Morphine Anaphylaxis   Penicillin G Anaphylaxis    All cillins All cillins   Povidone-Iodine Anaphylaxis and Hives   Sulfonamide Derivatives Anaphylaxis   Atrovent [Ipratropium] Other (See Comments)    Has sulfa in preservative  Ciprofloxacin Itching    Itchy palms   Hyoscyamine Sulfate Hives   Latex Itching   Meperidine Hcl Nausea And Vomiting    REACTION: N\T\V   Other Other (See Comments) and Swelling    Mucomist-unable to breathe Arm swelling with red streaks   Pneumococcal Vaccines Other (See Comments)    Pt declines   Ppd [Tuberculin Purified Protein Derivative] Swelling and Other (See Comments)    Arm swelling with red streaks   Reglan [Metoclopramide] Other (See Comments)   Zostavax [Zoster Vaccine Live] Other (See Comments)    Pt declines   Pepcid [Famotidine] Nausea And Vomiting, Palpitations and Other (See Comments)     Due to sulfide preservative   Current Outpatient Medications on File Prior to Visit  Medication Sig Dispense Refill   Ascorbic Acid (VITAMIN C) 1000 MG tablet Take 1,000 mg by mouth daily.     aspirin-acetaminophen-caffeine (EXCEDRIN MIGRAINE) 250-250-65 MG per tablet Take 1 tablet by mouth every 6 (six) hours as needed. For migraine     benzonatate (TESSALON) 200 MG capsule Take 1 capsule (200 mg total) by mouth 2 (two) times daily as needed for cough. 20 capsule 0   Cyanocobalamin (VITAMIN B 12 PO) Take 1 tablet by mouth daily.     cyclobenzaprine (FLEXERIL) 10 MG tablet TAKE 1 TABLET BY MOUTH 3 TIMES DAILY. MUST SEE PROVIDER FOR MORE REFILLS 60 tablet 0   diphenhydrAMINE (BENADRYL) 25 MG tablet Take 1 tablet (25 mg total) by mouth every 6 (six) hours as needed for up to 5 days. 20 tablet 0   EPINEPHrine 0.3 mg/0.3 mL IJ SOAJ injection As directed for severe allergic reaction (Patient taking differently: Inject 0.3 mg into the muscle once as needed for anaphylaxis.) 2 each 12   levalbuterol (XOPENEX HFA) 45 MCG/ACT inhaler Inhale 1-2 puffs into the lungs every 6 (six) hours as needed. For shortness of breath 15 g 1   levalbuterol (XOPENEX) 1.25 MG/3ML nebulizer solution Take 1.25 mg by nebulization every 4 (four) hours as needed for wheezing. 72 mL 12   Melatonin 10 MG CAPS Take 10 mg by mouth at bedtime as needed (sleep).      mometasone-formoterol (DULERA) 200-5 MCG/ACT AERO Inhale 2 puffs into the lungs 2 (two) times daily. Rinse mouth 1 each 0   pantoprazole (PROTONIX) 40 MG tablet TAKE 1 TABLET BY MOUTH DAILY 90 tablet 3   traMADol (ULTRAM) 50 MG tablet Take 1 tablet (50 mg total) by mouth every 6 (six) hours as needed. 60 tablet 1   triamcinolone ointment (KENALOG) 0.1 % Apply topically 2 (two) times daily. 30 g 0   vitamin E 400 UNIT capsule Take 400 Units by mouth daily.     zolpidem (AMBIEN) 10 MG tablet TAKE 1 TABLET BY MOUTH ONCE DAILY 90 tablet 0   No current facility-administered  medications on file prior to visit.        ROS:  All others reviewed and negative.  Objective        PE:  BP 120/84 (BP Location: Left Arm, Patient Position: Sitting, Cuff Size: Small)   Pulse 90   Ht 5\' 3"  (1.6 m)   Wt 154 lb (69.9 kg)   BMI 27.28 kg/m                 Constitutional: Pt appears in NAD               HENT: Head: NCAT.  Right Ear: External ear normal.                 Left Ear: External ear normal.                Eyes: . Pupils are equal, round, and reactive to light. Conjunctivae and EOM are normal               Nose: without d/c or deformity               Neck: Neck supple. Gross normal ROM               Cardiovascular: Normal rate and regular rhythm.                 Pulmonary/Chest: Effort normal and breath sounds without rales or wheezing.                Abd:  Soft, NT, ND, + BS, no organomegaly               Neurological: Pt is alert. At baseline orientation, motor grossly intact               Skin: Skin is warm. No rashes, no other new lesions, LE edema - none               Psychiatric: Pt behavior is normal without agitation   Micro: none  Cardiac tracings I have personally interpreted today:  none  Pertinent Radiological findings (summarize): none   Lab Results  Component Value Date   WBC 7.1 10/06/2023   HGB 14.2 10/06/2023   HCT 43.7 10/06/2023   PLT 493.0 (H) 10/06/2023   GLUCOSE 103 (H) 10/06/2023   CHOL 232 (H) 10/06/2023   TRIG 279.0 (H) 10/06/2023   HDL 66.50 10/06/2023   LDLDIRECT 80.0 04/06/2023   LDLCALC 110 (H) 10/06/2023   ALT 22 10/06/2023   AST 22 10/06/2023   NA 141 10/06/2023   K 4.0 10/06/2023   CL 101 10/06/2023   CREATININE 1.07 10/06/2023   BUN 20 10/06/2023   CO2 32 10/06/2023   TSH 4.11 10/06/2023   INR 1.0 08/24/2018   HGBA1C 6.3 10/06/2023   Assessment/Plan:  RUDEAN ICENHOUR is a 73 y.o. White or Caucasian [1] female with  has a past medical history of ASTHMA, BREAST CANCER, HX OF (11/1979), CKD  (chronic kidney disease) stage 3, GFR 30-59 ml/min (HCC) (03/05/2020), GERD, GI bleed (11/92, 11/93, 11/94), Irritable bowel syndrome, MIGRAINE HEADACHE, and Symptomatic anemia (08/24/2018).  Encounter for well adult exam with abnormal findings Age and sex appropriate education and counseling updated with regular exercise and diet Referrals for preventative services - none needed Immunizations addressed - none needed Smoking counseling  - none needed Evidence for depression or other mood disorder - none significant Most recent labs reviewed. I have personally reviewed and have noted: 1) the patient's medical and social history 2) The patient's current medications and supplements 3) The patient's height, weight, and BMI have been recorded in the chart   CKD (chronic kidney disease) stage 3, GFR 30-59 ml/min Lab Results  Component Value Date   CREATININE 1.07 10/06/2023   Stable overall, cont to avoid nephrotoxins   Hyperglycemia Lab Results  Component Value Date   HGBA1C 6.3 10/06/2023   Stable, pt to continue current medical treatment  - diet,w t control   Hypertriglyceridemia Lab Results  Component Value Date   CHOL 232 (H) 10/06/2023   HDL 66.50 10/06/2023  LDLCALC 110 (H) 10/06/2023   LDLDIRECT 80.0 04/06/2023   TRIG 279.0 (H) 10/06/2023   CHOLHDL 3 10/06/2023   Mild, for lower fat low chol diet  Hypothyroidism Lab Results  Component Value Date   TSH 4.11 10/06/2023   Stable, pt for no med at at this tim  Iron deficiency anemia No overt bleeding, also for iron lab f/u  Hypercalcemia New mild, for f/u PTH level  Vitamin D deficiency Last vitamin D Lab Results  Component Value Date   VD25OH 36.72 10/06/2023   Low, to start oral replacement   Followup: Return in about 6 months (around 04/04/2024).  Oliver Barre, MD 10/09/2023 7:52 PM [facility]

## 2023-10-07 ENCOUNTER — Encounter: Payer: Self-pay | Admitting: Internal Medicine

## 2023-10-07 ENCOUNTER — Other Ambulatory Visit: Payer: Self-pay | Admitting: Internal Medicine

## 2023-10-09 ENCOUNTER — Encounter: Payer: Self-pay | Admitting: Internal Medicine

## 2023-10-09 DIAGNOSIS — E559 Vitamin D deficiency, unspecified: Secondary | ICD-10-CM | POA: Insufficient documentation

## 2023-10-09 NOTE — Assessment & Plan Note (Signed)
Lab Results  Component Value Date   CREATININE 1.07 10/06/2023   Stable overall, cont to avoid nephrotoxins

## 2023-10-09 NOTE — Assessment & Plan Note (Signed)

## 2023-10-09 NOTE — Assessment & Plan Note (Signed)
No overt bleeding, also for iron lab f/u

## 2023-10-09 NOTE — Assessment & Plan Note (Signed)
Last vitamin D Lab Results  Component Value Date   VD25OH 36.72 10/06/2023   Low, to start oral replacement

## 2023-10-09 NOTE — Assessment & Plan Note (Signed)
Lab Results  Component Value Date   CHOL 232 (H) 10/06/2023   HDL 66.50 10/06/2023   LDLCALC 110 (H) 10/06/2023   LDLDIRECT 80.0 04/06/2023   TRIG 279.0 (H) 10/06/2023   CHOLHDL 3 10/06/2023   Mild, for lower fat low chol diet

## 2023-10-09 NOTE — Assessment & Plan Note (Signed)
New mild, for f/u PTH level

## 2023-10-09 NOTE — Assessment & Plan Note (Signed)
Lab Results  Component Value Date   HGBA1C 6.3 10/06/2023   Stable, pt to continue current medical treatment  - diet,w t control

## 2023-10-09 NOTE — Assessment & Plan Note (Signed)
Lab Results  Component Value Date   TSH 4.11 10/06/2023   Stable, pt for no med at at this tim

## 2023-10-11 ENCOUNTER — Other Ambulatory Visit: Payer: Self-pay | Admitting: Internal Medicine

## 2023-10-11 ENCOUNTER — Other Ambulatory Visit: Payer: Self-pay

## 2023-10-11 DIAGNOSIS — R5383 Other fatigue: Secondary | ICD-10-CM

## 2023-10-12 ENCOUNTER — Other Ambulatory Visit: Payer: Medicare Other

## 2023-10-13 ENCOUNTER — Encounter: Payer: Self-pay | Admitting: Internal Medicine

## 2023-10-13 LAB — PTH, INTACT AND CALCIUM
Calcium: 10.6 mg/dL — ABNORMAL HIGH (ref 8.6–10.4)
PTH: 16 pg/mL (ref 16–77)

## 2023-11-22 ENCOUNTER — Encounter: Payer: Self-pay | Admitting: Internal Medicine

## 2023-11-22 ENCOUNTER — Other Ambulatory Visit: Payer: Self-pay | Admitting: Internal Medicine

## 2023-11-22 ENCOUNTER — Ambulatory Visit (HOSPITAL_BASED_OUTPATIENT_CLINIC_OR_DEPARTMENT_OTHER)
Admission: RE | Admit: 2023-11-22 | Discharge: 2023-11-22 | Disposition: A | Discharge status: Home / Self Care | Payer: Self-pay | Source: Ambulatory Visit | Attending: Internal Medicine | Admitting: Internal Medicine

## 2023-11-22 ENCOUNTER — Encounter (HOSPITAL_BASED_OUTPATIENT_CLINIC_OR_DEPARTMENT_OTHER): Payer: Self-pay | Admitting: Obstetrics & Gynecology

## 2023-11-22 DIAGNOSIS — R739 Hyperglycemia, unspecified: Secondary | ICD-10-CM | POA: Insufficient documentation

## 2023-11-22 DIAGNOSIS — R9431 Abnormal electrocardiogram [ECG] [EKG]: Secondary | ICD-10-CM | POA: Insufficient documentation

## 2023-11-22 DIAGNOSIS — E781 Pure hyperglyceridemia: Secondary | ICD-10-CM | POA: Insufficient documentation

## 2023-11-22 MED ORDER — ROSUVASTATIN CALCIUM 10 MG PO TABS: 10.0000 mg | ORAL_TABLET | Freq: Every day | ORAL | 3 refills | Status: DC

## 2023-11-22 MED ORDER — ASPIRIN 81 MG PO TBEC: 81.0000 mg | DELAYED_RELEASE_TABLET | Freq: Every day | ORAL | Status: AC

## 2024-01-13 ENCOUNTER — Ambulatory Visit

## 2024-01-14 ENCOUNTER — Ambulatory Visit (INDEPENDENT_AMBULATORY_CARE_PROVIDER_SITE_OTHER)

## 2024-01-14 VITALS — Ht 63.0 in | Wt 154.0 lb

## 2024-01-14 DIAGNOSIS — Z0000 Encounter for general adult medical examination without abnormal findings: Secondary | ICD-10-CM

## 2024-01-14 NOTE — Progress Notes (Signed)
 Subjective:   [name] [name] is a 73 y.o. who presents for a Medicare Wellness preventive visit.  Visit Complete: Virtual I connected with  [name] [name] on 01/14/24 by a audio enabled telemedicine application and verified that I am speaking with the correct person using two identifiers.  Patient Location: Home  Provider Location: Office/Clinic  I discussed the limitations of evaluation and management by telemedicine. The patient expressed understanding and agreed to proceed.  Vital Signs: Because this visit was a virtual/telehealth visit, some criteria may be missing or patient reported. Any vitals not documented were not able to be obtained and vitals that have been documented are patient reported.  VideoDeclined- This patient declined Librarian, academic. Therefore the visit was completed with audio only.  Persons Participating in Visit: Patient.  AWV Questionnaire: No: Patient Medicare AWV questionnaire was not completed prior to this visit.  Cardiac Risk Factors include: advanced age (>94men, >27 women);Other (see comment), Risk factor comments: CKD stage 3     Objective:    Today's Vitals   01/14/24 0810  Weight: 154 lb (69.9 kg)  Height: 5\' 3"  (1.6 m)   Body mass index is 27.28 kg/m.     01/14/2024    8:20 AM 12/10/2022    3:18 PM 04/30/2022    9:33 AM 12/08/2021    1:48 PM 11/26/2020    9:16 AM 06/28/2019    9:00 AM 08/25/2018    7:43 PM  Advanced Directives  Does Patient Have a Medical Advance Directive? Yes Yes Yes Yes Yes Yes Yes  Type of Estate agent of [place];Living will;Out of facility DNR (pink MOST or yellow form) Healthcare Power of [place];Living will;Out of facility DNR (pink MOST or yellow form) Healthcare Power of [place];Living will Healthcare Power of [place];Living will Living will;Healthcare Power of [place];Out of facility DNR (pink MOST or yellow form) Healthcare Power of [place];Living will  Healthcare Power of [place];Living will  Does patient want to make changes to medical advance directive?   No - Patient declined  No - Patient declined  No - Patient declined  Copy of Healthcare Power of Attorney in Chart? No - copy requested No - copy requested No - copy requested No - copy requested No - copy requested No - copy requested Yes - validated most recent copy scanned in chart (See row information)    Current Medications (verified) Outpatient Encounter Medications as of 01/14/2024  Medication Sig   Ascorbic Acid  (VITAMIN C ) 1000 MG tablet Take 1,000 mg by mouth daily.   aspirin  EC 81 MG tablet Take 1 tablet (81 mg total) by mouth daily. Swallow whole.   aspirin -acetaminophen -caffeine (EXCEDRIN MIGRAINE) 250-250-65 MG per tablet Take 1 tablet by mouth every 6 (six) hours as needed. For migraine   benzonatate  (TESSALON ) 200 MG capsule Take 1 capsule (200 mg total) by mouth 2 (two) times daily as needed for cough.   Cyanocobalamin  (VITAMIN B 12 PO) Take 1 tablet by mouth daily.   cyclobenzaprine  (FLEXERIL ) 10 MG tablet TAKE 1 TABLET BY MOUTH 3 TIMES DAILY MUST SEE PROVIDER FOR MORE REFILLS   EPINEPHrine  0.3 mg/0.3 mL IJ SOAJ injection As directed for severe allergic reaction (Patient taking differently: Inject 0.3 mg into the muscle once as needed for anaphylaxis.)   levalbuterol  (XOPENEX  HFA) 45 MCG/ACT inhaler Inhale 1-2 puffs into the lungs every 6 (six) hours as needed. For shortness of breath   levalbuterol  (XOPENEX ) 1.25 MG/3ML nebulizer solution Take 1.25 mg by nebulization  every 4 (four) hours as needed for wheezing.   Melatonin 10 MG CAPS Take 10 mg by mouth at bedtime as needed (sleep).    mometasone -formoterol  (DULERA ) 200-5 MCG/ACT AERO Inhale 2 puffs into the lungs 2 (two) times daily. Rinse mouth   pantoprazole  (PROTONIX ) 40 MG tablet TAKE 1 TABLET BY MOUTH DAILY   rosuvastatin  (CRESTOR ) 10 MG tablet Take 1 tablet (10 mg total) by mouth daily. (Patient not taking: Reported  on 01/14/2024)   traMADol  (ULTRAM ) 50 MG tablet Take 1 tablet (50 mg total) by mouth every 6 (six) hours as needed.   triamcinolone  ointment (KENALOG ) 0.1 % Apply topically 2 (two) times daily.   vitamin E  400 UNIT capsule Take 400 Units by mouth daily.   zolpidem  (AMBIEN ) 10 MG tablet TAKE 1 TABLET BY MOUTH ONCE DAILY   diphenhydrAMINE  (BENADRYL ) 25 MG tablet Take 1 tablet (25 mg total) by mouth every 6 (six) hours as needed for up to 5 days.   No facility-administered encounter medications on file as of 01/14/2024.    Allergies (verified) Albuterol, Albuterol sulfate, Amoxicillin-pot clavulanate, Broccoli [brassica oleracea], Covid-19 (mrna) vaccine (pfizer) [covid-19 (mrna) vaccine], Drug ingredient [acetylcysteine], Epipen  [epinephrine ], Fluzone, Influenza vaccines, Morphine, Penicillin g, Povidone-iodine, Sulfonamide derivatives, Atrovent [ipratropium], Ciprofloxacin, Hyoscyamine  sulfate, Latex, Meperidine hcl, Other, Pneumococcal vaccines, Ppd [tuberculin purified protein derivative], Reglan [metoclopramide], Zostavax [zoster vaccine live], and Pepcid [famotidine]   History: Past Medical History:  Diagnosis Date   ASTHMA    BREAST CANCER, HX OF 11/1979   CKD (chronic kidney disease) stage 3, GFR 30-59 ml/min (HCC) 03/05/2020   GERD    GI bleed 11/92, 11/93, 11/94   GI Bleed   Irritable bowel syndrome    MIGRAINE HEADACHE    Symptomatic anemia 08/24/2018   Past Surgical History:  Procedure Laterality Date   ABDOMINAL HYSTERECTOMY  11/1979   BREAST SURGERY     ESOPHAGOGASTRODUODENOSCOPY N/A 08/26/2018   Procedure: ESOPHAGOGASTRODUODENOSCOPY (EGD);  Surgeon: Albertina Hugger, MD;  Location: [facility] ENDOSCOPY;  Service: Gastroenterology;  Laterality: N/A;   Left wrist  1963   MASTECTOMY  11/1979   Bilaterally & implants   OTHER SURGICAL HISTORY Bilateral    removal of breast implants   TONSILLECTOMY  1955   TUBAL LIGATION  1975   Family History  Problem Relation Age of Onset    Coronary artery disease Father 73       CABG age 23   Breast cancer Mother    Uterine cancer Mother    Coronary artery disease Mother    Endometriosis Sister    Colon polyps Sister 63       1/2 sister - same dad   Irritable bowel syndrome Sister    Hyperlipidemia Brother        1/2 bro, same dad   Hypertension Brother        1/2 bro, same dad   Heart disease Brother    Colon cancer Neg Hx    Liver cancer Neg Hx    Social History   Socioeconomic History   Marital status: Married    Spouse name: Ethelle Herb   Number of children: 1   Years of education: Not on file   Highest education level: Associate degree: academic program  Occupational History   Occupation: retired  Tobacco Use   Smoking status: Never   Smokeless tobacco: Never  Vaping Use   Vaping status: Never Used  Substance and Sexual Activity   Alcohol use: Yes    Alcohol/week: 0.0  standard drinks of alcohol    Comment: occ   Drug use: No   Sexual activity: Not on file  Other Topics Concern   Not on file  Social History Narrative   Married, lives at home with spouse and 1 dog   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, 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   Social Drivers of Health   Financial Resource Strain: Low Risk  (01/14/2024)   Overall Financial Resource Strain (CARDIA)    Difficulty of Paying Living Expenses: Not hard at all  Food Insecurity: No Food Insecurity (01/14/2024)   Hunger Vital Sign    Worried About Running Out of Food in the Last Year: Never true    Ran Out of Food in the Last Year: Never true  Transportation Needs: No Transportation Needs (01/14/2024)   PRAPARE - Administrator, Civil Service (Medical): No    Lack of Transportation (Non-Medical): No  Physical Activity: Inactive (01/14/2024)   Exercise Vital Sign    Days of Exercise per Week: 0 days    Minutes of Exercise per Session: 0 min  Stress: No Stress Concern Present (01/14/2024)   [employer] of Occupational Health -  Occupational Stress Questionnaire    Feeling of Stress : Only a little  Social Connections: Socially Integrated (01/14/2024)   Social Connection and Isolation Panel [NHANES]    Frequency of Communication with Friends and Family: More than three times a week    Frequency of Social Gatherings with Friends and Family: Once a week    Attends Religious Services: More than 4 times per year    Active Member of [employer] or Organizations: Yes    Attends Banker Meetings: 1 to 4 times per year    Marital Status: Married    Tobacco Counseling Counseling given: Not Answered    Clinical Intake:  Pre-visit preparation completed: Yes  Pain : No/denies pain     BMI - recorded: 27.28 Nutritional Status: BMI 25 -29 Overweight Nutritional Risks: None Diabetes: No  Lab Results  Component Value Date   HGBA1C 6.3 10/06/2023   HGBA1C 6.2 04/06/2023   HGBA1C 6.2 10/06/2022     How often do you need to have someone help you when you read instructions, pamphlets, or other written materials from your doctor or pharmacy?: 1 - Never  Interpreter Needed?: No  Information entered by :: Jaun Galluzzo, RMA   Activities of Daily Living     01/14/2024    8:15 AM  In your present state of health, do you have any difficulty performing the following activities:  Hearing? 0  Vision? 0  Difficulty concentrating or making decisions? 0  Walking or climbing stairs? 0  Dressing or bathing? 0  Doing errands, shopping? 0  Preparing Food and eating ? N  Using the Toilet? N  In the past six months, have you accidently leaked urine? Y  Do you have problems with loss of bowel control? N  Managing your Medications? N  Managing your Finances? N  Housekeeping or managing your Housekeeping? N    Patient Care Team: Roslyn Coombe, MD as PCP - General (Internal Medicine) Faustina Hood, MD as Consulting Physician (Pulmonary Disease) Ivery Marking, MD as Consulting Physician (Obstetrics and  Gynecology) Dominic Friendly Cordelia Dessert, MD as Consulting Physician (Gastroenterology) Aminta Kales, MD as Consulting Physician (Ophthalmology)  Indicate any recent Medical Services you may have received from other than [facility] providers in the past year (  date may be approximate).     Assessment:   This is a routine wellness examination for [place].  Hearing/Vision screen Hearing Screening - Comments:: Denies hearing difficulties   Vision Screening - Comments:: Wears eyeglasses   Goals Addressed             This Visit's Progress    My goal for 2024 is to stay out of Dr. Koleen Perna office. LOL!!!!!       SAME answer 2025       Depression Screen     01/14/2024    8:22 AM 10/06/2023    2:52 PM 04/06/2023    9:26 AM 12/10/2022    3:17 PM 11/18/2022    2:56 PM 10/06/2022   10:58 AM 10/06/2022   10:26 AM  PHQ 2/9 Scores  PHQ - 2 Score 0 0 0 0 0 0 0  PHQ- 9 Score 1   0 2  0    Fall Risk     01/14/2024    8:20 AM 10/06/2023    2:52 PM 04/06/2023    9:26 AM 12/10/2022    3:19 PM 12/10/2022   11:09 AM  Fall Risk   Falls in the past year? 0 0 0 0 0  Number falls in past yr: 0 0 0 0 0  Injury with Fall? 0 0 0 0 0  Risk for fall due to : No Fall Risks  No Fall Risks No Fall Risks   Follow up Falls prevention discussed;Falls evaluation completed  Falls evaluation completed Falls prevention discussed     MEDICARE RISK AT HOME:  Medicare Risk at Home Any stairs in or around the home?: Yes (outside in front and back) If so, are there any without handrails?: Yes Home free of loose throw rugs in walkways, pet beds, electrical cords, etc?: Yes Adequate lighting in your home to reduce risk of falls?: Yes Life alert?: No Use of a cane, walker or w/c?: No Grab bars in the bathroom?: No Shower chair or bench in shower?: Yes Elevated toilet seat or a handicapped toilet?: Yes  TIMED UP AND GO:  Was the test performed?  No  Cognitive Function: Declined/Normal: No cognitive concerns noted by patient  or family. Patient alert, oriented, able to answer questions appropriately and recall recent events. No signs of memory loss or confusion.        12/10/2022    3:20 PM  6CIT Screen  What Year? 0 points  What month? 0 points  What time? 0 points  Count back from 20 0 points  Months in reverse 0 points  Repeat phrase 0 points  Total Score 0 points    Immunizations Immunization History  Administered Date(s) Administered   Influenza Whole 09/14/2006   PFIZER(Purple Top)SARS-COV-2 Vaccination 06/04/2020   Td 09/15/2007    Screening Tests Health Maintenance  Topic Date Due   Medicare Annual Wellness (AWV)  01/13/2025   DEXA SCAN  Completed   Hepatitis C Screening  Completed   HPV VACCINES  Aged Out   Meningococcal B Vaccine  Aged Out   DTaP/Tdap/Td  Discontinued   Pneumonia Vaccine 30+ Years old  Discontinued   COVID-19 Vaccine  Discontinued   Zoster Vaccines- Shingrix  Discontinued    Health Maintenance  There are no preventive care reminders to display for this patient. Health Maintenance Items Addressed: See Nurse Notes  Additional Screening:  Vision Screening: Recommended annual ophthalmology exams for early detection of glaucoma and other disorders of the eye.  Dental Screening: Recommended annual dental exams for proper oral hygiene  Community Resource Referral / Chronic Care Management: CRR required this visit?  No   CCM required this visit?  No     Plan:     I have personally reviewed and noted the following in the patient's chart:   Medical and social history Use of alcohol, tobacco or illicit drugs  Current medications and supplements including opioid prescriptions. Patient is currently taking opioid prescriptions. Information provided to patient regarding non-opioid alternatives. Patient advised to discuss non-opioid treatment plan with their provider. Functional ability and status Nutritional status Physical activity Advanced directives List  of other physicians Hospitalizations, surgeries, and ER visits in previous 12 months Vitals Screenings to include cognitive, depression, and falls Referrals and appointments  In addition, I have reviewed and discussed with patient certain preventive protocols, quality metrics, and best practice recommendations. A written personalized care plan for preventive services as well as general preventive health recommendations were provided to patient.     Yzabelle Calles L Arnett Galindez, CMA   01/14/2024   After Visit Summary: (MyChart) Due to this being a telephonic visit, the after visit summary with patients personalized plan was offered to patient via MyChart   Notes: Nothing significant to report at this time.

## 2024-01-14 NOTE — Patient Instructions (Signed)
 Ms. [name] [name] , Thank you for taking time to come for your Medicare Wellness Visit. I appreciate your ongoing commitment to your health goals. Please review the following plan we discussed and let me know if I can assist you in the future.   Referrals/Orders/Follow-Ups/Clinician Recommendations: It was nice talking with you today.  Aim for 30 minutes of exercise or brisk walking, 6-8 glasses of water, and 5 servings of fruits and vegetables each day.   This is a list of the screening recommended for you and due dates:  Health Maintenance  Topic Date Due   Medicare Annual Wellness Visit  01/13/2025   DEXA scan (bone density measurement)  Completed   Hepatitis C Screening  Completed   HPV Vaccine  Aged Out   Meningitis B Vaccine  Aged Out   DTaP/Tdap/Td vaccine  Discontinued   Pneumonia Vaccine  Discontinued   COVID-19 Vaccine  Discontinued   Zoster (Shingles) Vaccine  Discontinued    Advanced directives: (Copy Requested) Please bring a copy of your health care power of attorney and living will to the office to be added to your chart at your convenience. You can mail to [facility] [address] [place], [place] [address] or email to [email or web address]  Next Medicare Annual Wellness Visit scheduled for next year: Yes  Have you seen your provider in the last 6 months (3 months if uncontrolled diabetes)? Yes In Jan 2025.

## 2024-02-03 ENCOUNTER — Other Ambulatory Visit: Payer: Self-pay | Admitting: Internal Medicine

## 2024-02-11 ENCOUNTER — Ambulatory Visit (INDEPENDENT_AMBULATORY_CARE_PROVIDER_SITE_OTHER): Admitting: Internal Medicine

## 2024-02-11 ENCOUNTER — Encounter: Payer: Self-pay | Admitting: Internal Medicine

## 2024-02-11 VITALS — BP 122/76 | HR 95 | Temp 98.1°F | Ht 63.0 in | Wt 152.0 lb

## 2024-02-11 DIAGNOSIS — N1831 Chronic kidney disease, stage 3a: Secondary | ICD-10-CM | POA: Diagnosis not present

## 2024-02-11 DIAGNOSIS — R739 Hyperglycemia, unspecified: Secondary | ICD-10-CM | POA: Diagnosis not present

## 2024-02-11 DIAGNOSIS — E559 Vitamin D deficiency, unspecified: Secondary | ICD-10-CM | POA: Diagnosis not present

## 2024-02-11 DIAGNOSIS — M109 Gout, unspecified: Secondary | ICD-10-CM | POA: Diagnosis not present

## 2024-02-11 MED ORDER — COLCHICINE 0.6 MG PO TABS: ORAL_TABLET | ORAL | 5 refills | Status: DC

## 2024-02-11 MED ORDER — PREDNISONE 10 MG PO TABS: ORAL_TABLET | ORAL | 0 refills | Status: DC

## 2024-02-11 MED ORDER — ALLOPURINOL 100 MG PO TABS: 100.0000 mg | ORAL_TABLET | Freq: Every day | ORAL | 3 refills | Status: DC

## 2024-02-11 NOTE — Assessment & Plan Note (Signed)
 Lab Results  Component Value Date   HGBA1C 6.3 10/06/2023   Stable, pt to continue current medical treatment  - diet,w t control

## 2024-02-11 NOTE — Patient Instructions (Addendum)
 Please take all new medication as prescribed - the colchicine , allopurinol , and the prednisone   Please continue all other medications as before, and refills have been done if requested.  Please have the pharmacy call with any other refills you may need.  Please keep your appointments with your specialists as you may have planned  Please make an Appointment to return in July 22, or sooner if needed

## 2024-02-11 NOTE — Assessment & Plan Note (Signed)
 Lab Results  Component Value Date   CREATININE 1.07 10/06/2023   Stable overall, cont to avoid nephrotoxins

## 2024-02-11 NOTE — Assessment & Plan Note (Signed)
 Last vitamin D Lab Results  Component Value Date   VD25OH 36.72 10/06/2023   Low, to start oral replacement

## 2024-02-11 NOTE — Assessment & Plan Note (Signed)
 Right thumb cmc, for prednisone  taper, start alloupurinol 100 mg every day, and colchcine prn flares

## 2024-02-11 NOTE — Progress Notes (Signed)
 Patient ID: [name] [name], female   DOB: 11-16-1950, 73 y.o.   MRN: [id number]        Chief Complaint: follow up severe pain swelling right thumb CMC,        HPI:  [name] [name] is a 73 y.o. female here after running out of colchcine 1.5 wks ago, now with marked pain, swelling and tender to right thumb cmc without fever, trauma or overuse.  Pt denies chest pain, increased sob or doe, wheezing, orthopnea, PND, increased LE swelling, palpitations, dizziness or syncope.   Pt denies polydipsia, polyuria, or new focal neuro s/s.    Pt denies fever, wt loss, night sweats, loss of appetite, or other constitutional symptoms         Wt Readings from Last 3 Encounters:  02/11/24 152 lb (68.9 kg)  01/14/24 154 lb (69.9 kg)  10/06/23 154 lb (69.9 kg)   BP Readings from Last 3 Encounters:  02/11/24 122/76  10/06/23 120/84  08/26/23 120/72         Past Medical History:  Diagnosis Date   ASTHMA    BREAST CANCER, HX OF 11/1979   CKD (chronic kidney disease) stage 3, GFR 30-59 ml/min (HCC) 03/05/2020   GERD    GI bleed 11/92, 11/93, 11/94   GI Bleed   Irritable bowel syndrome    MIGRAINE HEADACHE    Symptomatic anemia 08/24/2018   Past Surgical History:  Procedure Laterality Date   ABDOMINAL HYSTERECTOMY  11/1979   BREAST SURGERY     ESOPHAGOGASTRODUODENOSCOPY N/A 08/26/2018   Procedure: ESOPHAGOGASTRODUODENOSCOPY (EGD);  Surgeon: Albertina Hugger, MD;  Location: [facility] ENDOSCOPY;  Service: Gastroenterology;  Laterality: N/A;   Left wrist  1963   MASTECTOMY  11/1979   Bilaterally & implants   OTHER SURGICAL HISTORY Bilateral    removal of breast implants   TONSILLECTOMY  1955   TUBAL LIGATION  1975    reports that she has never smoked. She has never used smokeless tobacco. She reports current alcohol use. She reports that she does not use drugs. family history includes Breast cancer in her mother; Colon polyps (age of onset: 27) in her sister; Coronary artery disease in her mother; Coronary  artery disease (age of onset: 86) in her father; Endometriosis in her sister; Heart disease in her brother; Hyperlipidemia in her brother; Hypertension in her brother; Irritable bowel syndrome in her sister; Uterine cancer in her mother. Allergies  Allergen Reactions   Albuterol Anaphylaxis    Pt states she has tolerated levalbuterol    Albuterol Sulfate Anaphylaxis    Pt states she has tolerated levalbuterol    Amoxicillin-Pot Clavulanate Anaphylaxis    Lips swell    Broccoli [Brassica Oleracea] Anaphylaxis   Covid-19 (Mrna) Vaccine Proofreader) [Covid-19 (Mrna) Vaccine] Anaphylaxis   Drug Ingredient [Acetylcysteine] Shortness Of Breath   Epipen  [Epinephrine ] Other (See Comments)    Allergic to preservatives in Epipen  with caution!!!   Fluzone Anaphylaxis   Influenza Vaccines Anaphylaxis   Morphine Anaphylaxis   Penicillin G Anaphylaxis    All cillins All cillins   Povidone-Iodine Anaphylaxis and Hives   Sulfonamide Derivatives Anaphylaxis   Atrovent [Ipratropium] Other (See Comments)    Has sulfa in preservative   Ciprofloxacin Itching    Itchy palms   Hyoscyamine  Sulfate Hives   Latex Itching   Meperidine Hcl Nausea And Vomiting    REACTION: N\T\V   Other Other (See Comments) and Swelling    Mucomist-unable to breathe Arm swelling with red streaks  Pneumococcal Vaccines Other (See Comments)    Pt declines   Ppd [Tuberculin Purified Protein Derivative] Swelling and Other (See Comments)    Arm swelling with red streaks   Reglan [Metoclopramide] Other (See Comments)   Zostavax [Zoster Vaccine Live] Other (See Comments)    Pt declines   Pepcid [Famotidine] Nausea And Vomiting, Palpitations and Other (See Comments)    Due to sulfide preservative   Current Outpatient Medications on File Prior to Visit  Medication Sig Dispense Refill   Ascorbic Acid  (VITAMIN C ) 1000 MG tablet Take 1,000 mg by mouth daily.     aspirin  EC 81 MG tablet Take 1 tablet (81 mg total) by mouth daily.  Swallow whole.     aspirin -acetaminophen -caffeine (EXCEDRIN MIGRAINE) 250-250-65 MG per tablet Take 1 tablet by mouth every 6 (six) hours as needed. For migraine     benzonatate  (TESSALON ) 200 MG capsule Take 1 capsule (200 mg total) by mouth 2 (two) times daily as needed for cough. 20 capsule 0   Cyanocobalamin  (VITAMIN B 12 PO) Take 1 tablet by mouth daily.     cyclobenzaprine  (FLEXERIL ) 10 MG tablet TAKE 1 TABLET BY MOUTH 3 TIMES DAILY MUST SEE PROVIDER FOR MORE REFILLS 60 tablet 0   EPINEPHrine  0.3 mg/0.3 mL IJ SOAJ injection As directed for severe allergic reaction (Patient taking differently: Inject 0.3 mg into the muscle once as needed for anaphylaxis.) 2 each 12   levalbuterol  (XOPENEX  HFA) 45 MCG/ACT inhaler Inhale 1-2 puffs into the lungs every 6 (six) hours as needed. For shortness of breath 15 g 1   levalbuterol  (XOPENEX ) 1.25 MG/3ML nebulizer solution Take 1.25 mg by nebulization every 4 (four) hours as needed for wheezing. 72 mL 12   Melatonin 10 MG CAPS Take 10 mg by mouth at bedtime as needed (sleep).      mometasone -formoterol  (DULERA ) 200-5 MCG/ACT AERO Inhale 2 puffs into the lungs 2 (two) times daily. Rinse mouth 1 each 0   pantoprazole  (PROTONIX ) 40 MG tablet TAKE 1 TABLET BY MOUTH DAILY 90 tablet 3   rosuvastatin  (CRESTOR ) 10 MG tablet Take 1 tablet (10 mg total) by mouth daily. (Patient not taking: Reported on 02/11/2024) 90 tablet 3   traMADol  (ULTRAM ) 50 MG tablet Take 1 tablet (50 mg total) by mouth every 6 (six) hours as needed. 60 tablet 1   triamcinolone  ointment (KENALOG ) 0.1 % Apply topically 2 (two) times daily. 30 g 0   vitamin E  400 UNIT capsule Take 400 Units by mouth daily.     zolpidem  (AMBIEN ) 10 MG tablet TAKE 1 TABLET BY MOUTH ONCE DAILY 90 tablet 0   diphenhydrAMINE  (BENADRYL ) 25 MG tablet Take 1 tablet (25 mg total) by mouth every 6 (six) hours as needed for up to 5 days. 20 tablet 0   No current facility-administered medications on file prior to visit.         ROS:  All others reviewed and negative.  Objective        PE:  BP 122/76 (BP Location: Right Arm, Patient Position: Sitting, Cuff Size: Normal)   Pulse 95   Temp 98.1 F (36.7 C) (Oral)   Ht 5\' 3"  (1.6 m)   Wt 152 lb (68.9 kg)   SpO2 96%   BMI 26.93 kg/m                 Constitutional: Pt appears in NAD               HENT: Head: NCAT.  Right Ear: External ear normal.                 Left Ear: External ear normal.                Eyes: . Pupils are equal, round, and reactive to light. Conjunctivae and EOM are normal               Nose: without d/c or deformity               Neck: Neck supple. Gross normal ROM               Cardiovascular: Normal rate and regular rhythm.                 Pulmonary/Chest: Effort normal and breath sounds without rales or wheezing.                              Neurological: Pt is alert. At baseline orientation, motor grossly intact               Skin: Skin is warm. No rashes, no other new lesions, LE edema - none               Right thumb cmc with 2-3+ tender, swelling               Psychiatric: Pt behavior is normal without agitation   Micro: none  Cardiac tracings I have personally interpreted today:  none  Pertinent Radiological findings (summarize): none   Lab Results  Component Value Date   WBC 7.1 10/06/2023   HGB 14.2 10/06/2023   HCT 43.7 10/06/2023   PLT 493.0 (H) 10/06/2023   GLUCOSE 103 (H) 10/06/2023   CHOL 232 (H) 10/06/2023   TRIG 279.0 (H) 10/06/2023   HDL 66.50 10/06/2023   LDLDIRECT 80.0 04/06/2023   LDLCALC 110 (H) 10/06/2023   ALT 22 10/06/2023   AST 22 10/06/2023   NA 141 10/06/2023   K 4.0 10/06/2023   CL 101 10/06/2023   CREATININE 1.07 10/06/2023   BUN 20 10/06/2023   CO2 32 10/06/2023   TSH 4.11 10/06/2023   INR 1.0 08/24/2018   HGBA1C 6.3 10/06/2023   Assessment/Plan:  [name] [name] is a 73 y.o. White or Caucasian [1] female with  has a past medical history of ASTHMA, BREAST CANCER, HX  OF (11/1979), CKD (chronic kidney disease) stage 3, GFR 30-59 ml/min (HCC) (03/05/2020), GERD, GI bleed (11/92, 11/93, 11/94), Irritable bowel syndrome, MIGRAINE HEADACHE, and Symptomatic anemia (08/24/2018).  Hyperglycemia Lab Results  Component Value Date   HGBA1C 6.3 10/06/2023   Stable, pt to continue current medical treatment  - diet, wt control   CKD (chronic kidney disease) stage 3, GFR 30-59 ml/min Lab Results  Component Value Date   CREATININE 1.07 10/06/2023   Stable overall, cont to avoid nephrotoxins   Vitamin D  deficiency Last vitamin D  Lab Results  Component Value Date   VD25OH 36.72 10/06/2023   Low, to start oral replacement   Acute gouty arthritis Right thumb cmc, for prednisone  taper, start alloupurinol 100 mg every day, and colchcine prn flares  Followup: Return in about 8 weeks (around 04/04/2024).  Rosalia Colonel, MD 02/11/2024 2:09 PM [facility] [place] [facility] - [facility]

## 2024-02-16 DIAGNOSIS — H35363 Drusen (degenerative) of macula, bilateral: Secondary | ICD-10-CM | POA: Diagnosis not present

## 2024-02-16 DIAGNOSIS — H2513 Age-related nuclear cataract, bilateral: Secondary | ICD-10-CM | POA: Diagnosis not present

## 2024-02-16 DIAGNOSIS — H02834 Dermatochalasis of left upper eyelid: Secondary | ICD-10-CM | POA: Diagnosis not present

## 2024-02-16 DIAGNOSIS — H40013 Open angle with borderline findings, low risk, bilateral: Secondary | ICD-10-CM | POA: Diagnosis not present

## 2024-03-13 ENCOUNTER — Other Ambulatory Visit: Payer: Self-pay

## 2024-03-13 ENCOUNTER — Other Ambulatory Visit: Payer: Self-pay | Admitting: Internal Medicine

## 2024-03-13 DIAGNOSIS — R5383 Other fatigue: Secondary | ICD-10-CM

## 2024-04-04 ENCOUNTER — Ambulatory Visit: Payer: Medicare Other | Admitting: Internal Medicine

## 2024-04-04 ENCOUNTER — Encounter: Payer: Self-pay | Admitting: Internal Medicine

## 2024-04-04 VITALS — BP 108/68 | HR 98 | Temp 97.4°F | Ht 63.0 in | Wt 155.4 lb

## 2024-04-04 DIAGNOSIS — M85859 Other specified disorders of bone density and structure, unspecified thigh: Secondary | ICD-10-CM | POA: Diagnosis not present

## 2024-04-04 DIAGNOSIS — E559 Vitamin D deficiency, unspecified: Secondary | ICD-10-CM

## 2024-04-04 DIAGNOSIS — E039 Hypothyroidism, unspecified: Secondary | ICD-10-CM | POA: Diagnosis not present

## 2024-04-04 DIAGNOSIS — N1831 Chronic kidney disease, stage 3a: Secondary | ICD-10-CM

## 2024-04-04 DIAGNOSIS — J4520 Mild intermittent asthma, uncomplicated: Secondary | ICD-10-CM

## 2024-04-04 DIAGNOSIS — R739 Hyperglycemia, unspecified: Secondary | ICD-10-CM | POA: Diagnosis not present

## 2024-04-04 DIAGNOSIS — E785 Hyperlipidemia, unspecified: Secondary | ICD-10-CM | POA: Diagnosis not present

## 2024-04-04 DIAGNOSIS — R051 Acute cough: Secondary | ICD-10-CM

## 2024-04-04 MED ORDER — ZOLPIDEM TARTRATE 10 MG PO TABS: 10.0000 mg | ORAL_TABLET | Freq: Every day | ORAL | 1 refills | Status: DC

## 2024-04-04 MED ORDER — EPINEPHRINE 0.3 MG/0.3ML IJ SOAJ: INTRAMUSCULAR | 12 refills | Status: AC

## 2024-04-04 MED ORDER — LEVALBUTEROL TARTRATE 45 MCG/ACT IN AERO: 1.0000 | INHALATION_SPRAY | Freq: Four times a day (QID) | RESPIRATORY_TRACT | 11 refills | Status: DC | PRN

## 2024-04-04 MED ORDER — DULERA 200-5 MCG/ACT IN AERO: 2.0000 | INHALATION_SPRAY | Freq: Two times a day (BID) | RESPIRATORY_TRACT | 11 refills | Status: DC

## 2024-04-04 NOTE — Assessment & Plan Note (Signed)
 Lab Results  Component Value Date   TSH 4.11 10/06/2023   Stable, pt cont off med tx for now

## 2024-04-04 NOTE — Assessment & Plan Note (Signed)
 Lab Results  Component Value Date   HGBA1C 6.3 10/06/2023   Stable, pt to continue current medical treatment  - diet,w t control

## 2024-04-04 NOTE — Patient Instructions (Addendum)
Please continue all other medications as before, and refills have been done if requested.  Please have the pharmacy call with any other refills you may need.  Please continue your efforts at being more active, low cholesterol diet, and weight control  Please keep your appointments with your specialists as you may have planned  We can hold on lab testing today  Please make an Appointment to return in 6 months, or sooner if needed

## 2024-04-04 NOTE — Assessment & Plan Note (Signed)
 Last vitamin D Lab Results  Component Value Date   VD25OH 36.72 10/06/2023   Low, to start oral replacement

## 2024-04-04 NOTE — Progress Notes (Signed)
 Patient ID: [name] [name], female   DOB: [birth year], 73 y.o.   MRN: [id number]        Chief Complaint: follow up HLD, osteopenia with hx T11 fx, ckd3a, hyperglycemia, low vit d       HPI:  [name] [name] is a 73 y.o. female here overall doing ok, Pt denies chest pain, increased sob or doe, wheezing, orthopnea, PND, increased LE swelling, palpitations, dizziness or syncope.   Pt denies polydipsia, polyuria, or new focal neuro s/s.   Had hx of T11 fx and osteopenia, not interested in tx for now.          Wt Readings from Last 3 Encounters:  04/04/24 155 lb 6.4 oz (70.5 kg)  02/11/24 152 lb (68.9 kg)  01/14/24 154 lb (69.9 kg)   BP Readings from Last 3 Encounters:  04/04/24 108/68  02/11/24 122/76  10/06/23 120/84         Past Medical History:  Diagnosis Date   ASTHMA    BREAST CANCER, HX OF 11/1979   CKD (chronic kidney disease) stage 3, GFR 30-59 ml/min (HCC) 03/05/2020   GERD    GI bleed 11/92, 11/93, 11/94   GI Bleed   Irritable bowel syndrome    MIGRAINE HEADACHE    Symptomatic anemia 08/24/2018   Past Surgical History:  Procedure Laterality Date   ABDOMINAL HYSTERECTOMY  11/1979   BREAST SURGERY     ESOPHAGOGASTRODUODENOSCOPY N/A 08/26/2018   Procedure: ESOPHAGOGASTRODUODENOSCOPY (EGD);  Surgeon: Legrand Victory LITTIE DOUGLAS, MD;  Location: [facility] ENDOSCOPY;  Service: Gastroenterology;  Laterality: N/A;   Left wrist  1963   MASTECTOMY  11/1979   Bilaterally & implants   OTHER SURGICAL HISTORY Bilateral    removal of breast implants   TONSILLECTOMY  1955   TUBAL LIGATION  1975    reports that she has never smoked. She has never used smokeless tobacco. She reports current alcohol use. She reports that she does not use drugs. family history includes Breast cancer in her mother; Colon polyps (age of onset: 6) in her sister; Coronary artery disease in her mother; Coronary artery disease (age of onset: 45) in her father; Endometriosis in her sister; Heart disease in her brother;  Hyperlipidemia in her brother; Hypertension in her brother; Irritable bowel syndrome in her sister; Uterine cancer in her mother. Allergies  Allergen Reactions   Albuterol Anaphylaxis    Pt states she has tolerated levalbuterol    Albuterol Sulfate Anaphylaxis    Pt states she has tolerated levalbuterol    Amoxicillin-Pot Clavulanate Anaphylaxis    Lips swell    Broccoli [Brassica Oleracea] Anaphylaxis   Covid-19 (Mrna) Vaccine Proofreader) [Covid-19 (Mrna) Vaccine] Anaphylaxis   Drug Ingredient [Acetylcysteine] Shortness Of Breath   Epipen  [Epinephrine ] Other (See Comments)    Allergic to preservatives in Epipen  with caution!!!   Fluzone Anaphylaxis   Influenza Vaccines Anaphylaxis   Morphine Anaphylaxis   Penicillin G Anaphylaxis    All cillins All cillins   Povidone-Iodine Anaphylaxis and Hives   Sulfonamide Derivatives Anaphylaxis   Atrovent [Ipratropium] Other (See Comments)    Has sulfa in preservative   Ciprofloxacin Itching    Itchy palms   Hyoscyamine  Sulfate Hives   Latex Itching   Meperidine Hcl Nausea And Vomiting    REACTION: N\T\V   Other Other (See Comments) and Swelling    Mucomist-unable to breathe Arm swelling with red streaks   Pneumococcal Vaccines Other (See Comments)    Pt declines   Ppd [Tuberculin Purified  Protein Derivative] Swelling and Other (See Comments)    Arm swelling with red streaks   Reglan [Metoclopramide] Other (See Comments)   Zostavax [Zoster Vaccine Live] Other (See Comments)    Pt declines   Pepcid [Famotidine] Nausea And Vomiting, Palpitations and Other (See Comments)    Due to sulfide preservative   Current Outpatient Medications on File Prior to Visit  Medication Sig Dispense Refill   allopurinol  (ZYLOPRIM ) 100 MG tablet Take 1 tablet (100 mg total) by mouth daily. 90 tablet 3   Ascorbic Acid  (VITAMIN C ) 1000 MG tablet Take 1,000 mg by mouth daily.     aspirin  EC 81 MG tablet Take 1 tablet (81 mg total) by mouth daily. Swallow whole.      aspirin -acetaminophen -caffeine (EXCEDRIN MIGRAINE) 250-250-65 MG per tablet Take 1 tablet by mouth every 6 (six) hours as needed. For migraine     benzonatate  (TESSALON ) 200 MG capsule Take 1 capsule (200 mg total) by mouth 2 (two) times daily as needed for cough. 20 capsule 0   colchicine  0.6 MG tablet Take 1 tab by mouth every 2 hrs for gout flare until better or diarrhea 60 tablet 5   Cyanocobalamin  (VITAMIN B 12 PO) Take 1 tablet by mouth daily.     cyclobenzaprine  (FLEXERIL ) 10 MG tablet TAKE 1 TABLET BY MOUTH 3 TIMES DAILY. must make appt FOR more refills 60 tablet 0   diphenhydrAMINE  (BENADRYL ) 25 MG tablet Take 1 tablet (25 mg total) by mouth every 6 (six) hours as needed for up to 5 days. 20 tablet 0   levalbuterol  (XOPENEX ) 1.25 MG/3ML nebulizer solution Take 1.25 mg by nebulization every 4 (four) hours as needed for wheezing. 72 mL 12   Melatonin 10 MG CAPS Take 10 mg by mouth at bedtime as needed (sleep).      pantoprazole  (PROTONIX ) 40 MG tablet TAKE 1 TABLET BY MOUTH DAILY 90 tablet 3   predniSONE  (DELTASONE ) 10 MG tablet 3 tabs by mouth per day for 3 days,2tabs per day for 3 days,1tab per day for 3 days 18 tablet 0   rosuvastatin  (CRESTOR ) 10 MG tablet Take 1 tablet (10 mg total) by mouth daily. (Patient not taking: Reported on 04/04/2024) 90 tablet 3   traMADol  (ULTRAM ) 50 MG tablet Take 1 tablet (50 mg total) by mouth every 6 (six) hours as needed. 60 tablet 1   triamcinolone  ointment (KENALOG ) 0.1 % Apply topically 2 (two) times daily. 30 g 0   vitamin E  400 UNIT capsule Take 400 Units by mouth daily.     No current facility-administered medications on file prior to visit.        ROS:  All others reviewed and negative.  Objective        PE:  BP 108/68   Pulse 98   Temp (!) 97.4 F (36.3 C)   Ht 5' 3 (1.6 m)   Wt 155 lb 6.4 oz (70.5 kg)   SpO2 98%   BMI 27.53 kg/m                 Constitutional: Pt appears in NAD               HENT: Head: NCAT.                 Right Ear: External ear normal.                 Left Ear: External ear normal.  Eyes: . Pupils are equal, round, and reactive to light. Conjunctivae and EOM are normal               Nose: without d/c or deformity               Neck: Neck supple. Gross normal ROM               Cardiovascular: Normal rate and regular rhythm.                 Pulmonary/Chest: Effort normal and breath sounds without rales or wheezing.                Abd:  Soft, NT, ND, + BS, no organomegaly               Neurological: Pt is alert. At baseline orientation, motor grossly intact               Skin: Skin is warm. No rashes, no other new lesions, LE edema - none               Psychiatric: Pt behavior is normal without agitation   Micro: none  Cardiac tracings I have personally interpreted today:  none  Pertinent Radiological findings (summarize): none   Lab Results  Component Value Date   WBC 7.1 10/06/2023   HGB 14.2 10/06/2023   HCT 43.7 10/06/2023   PLT 493.0 (H) 10/06/2023   GLUCOSE 103 (H) 10/06/2023   CHOL 232 (H) 10/06/2023   TRIG 279.0 (H) 10/06/2023   HDL 66.50 10/06/2023   LDLDIRECT 80.0 04/06/2023   LDLCALC 110 (H) 10/06/2023   ALT 22 10/06/2023   AST 22 10/06/2023   NA 141 10/06/2023   K 4.0 10/06/2023   CL 101 10/06/2023   CREATININE 1.07 10/06/2023   BUN 20 10/06/2023   CO2 32 10/06/2023   TSH 4.11 10/06/2023   INR 1.0 08/24/2018   HGBA1C 6.3 10/06/2023   Assessment/Plan:  [name] [name] is a 73 y.o. White or Caucasian [1] female with  has a past medical history of ASTHMA, BREAST CANCER, HX OF (11/1979), CKD (chronic kidney disease) stage 3, GFR 30-59 ml/min (HCC) (03/05/2020), GERD, GI bleed (11/92, 11/93, 11/94), Irritable bowel syndrome, MIGRAINE HEADACHE, and Symptomatic anemia (08/24/2018).  CKD (chronic kidney disease) stage 3, GFR 30-59 ml/min Lab Results  Component Value Date   CREATININE 1.07 10/06/2023   Stable overall, cont to avoid  nephrotoxins   Hyperglycemia Lab Results  Component Value Date   HGBA1C 6.3 10/06/2023   Stable, pt to continue current medical treatment  - diet, wt control   Hypothyroidism Lab Results  Component Value Date   TSH 4.11 10/06/2023   Stable, pt cont off med tx for now   Vitamin D  deficiency Last vitamin D  Lab Results  Component Value Date   VD25OH 36.72 10/06/2023   Low, to start oral replacement   Osteopenia With hx of T11 fx - declines referral to [facility] for management  Followup: Return in about 6 months (around 10/05/2024).  [name], MD 04/04/2024 8:40 PM [facility] - [facility]

## 2024-04-04 NOTE — Assessment & Plan Note (Signed)
 Lab Results  Component Value Date   CREATININE 1.07 10/06/2023   Stable overall, cont to avoid nephrotoxins

## 2024-04-04 NOTE — Assessment & Plan Note (Signed)
 With hx of T11 fx - declines referral to [facility] for management

## 2024-04-04 NOTE — Assessment & Plan Note (Signed)
 Lab Results  Component Value Date   LDLCALC 110 (H) 10/06/2023   uncontrolled, pt declines statin

## 2024-04-11 NOTE — Progress Notes (Signed)
 err

## 2024-04-12 ENCOUNTER — Encounter: Payer: Self-pay | Admitting: Internal Medicine

## 2024-04-17 MED ORDER — INDOMETHACIN 50 MG PO CAPS: 50.0000 mg | ORAL_CAPSULE | Freq: Three times a day (TID) | ORAL | 3 refills | Status: AC

## 2024-04-17 MED ORDER — ALLOPURINOL 300 MG PO TABS: 300.0000 mg | ORAL_TABLET | Freq: Every day | ORAL | 3 refills | Status: DC

## 2024-05-01 ENCOUNTER — Encounter: Payer: Self-pay | Admitting: Internal Medicine

## 2024-05-01 DIAGNOSIS — M79671 Pain in right foot: Secondary | ICD-10-CM

## 2024-05-03 ENCOUNTER — Ambulatory Visit (INDEPENDENT_AMBULATORY_CARE_PROVIDER_SITE_OTHER)

## 2024-05-03 ENCOUNTER — Other Ambulatory Visit: Payer: Self-pay | Admitting: Podiatry

## 2024-05-03 ENCOUNTER — Ambulatory Visit: Admitting: Podiatry

## 2024-05-03 VITALS — Ht 63.0 in | Wt 155.0 lb

## 2024-05-03 DIAGNOSIS — M21622 Bunionette of left foot: Secondary | ICD-10-CM

## 2024-05-03 DIAGNOSIS — M1000 Idiopathic gout, unspecified site: Secondary | ICD-10-CM

## 2024-05-03 DIAGNOSIS — M21621 Bunionette of right foot: Secondary | ICD-10-CM

## 2024-05-03 DIAGNOSIS — M7751 Other enthesopathy of right foot: Secondary | ICD-10-CM

## 2024-05-03 DIAGNOSIS — M79672 Pain in left foot: Secondary | ICD-10-CM | POA: Diagnosis not present

## 2024-05-03 DIAGNOSIS — M7671 Peroneal tendinitis, right leg: Secondary | ICD-10-CM

## 2024-05-03 DIAGNOSIS — M79671 Pain in right foot: Secondary | ICD-10-CM

## 2024-05-03 DIAGNOSIS — M778 Other enthesopathies, not elsewhere classified: Secondary | ICD-10-CM

## 2024-05-03 MED ORDER — PREDNISONE 5 MG PO TABS: ORAL_TABLET | ORAL | 0 refills | Status: DC

## 2024-05-03 NOTE — Progress Notes (Signed)
 Patient presents with pain and redness over the lateral aspect of the midfoot right.  Indicates over the fifth metatarsal base.  He said she initially had pain at the fifth MTP bilaterally.  Pain is moved to the fifth metatarsal base on the right.  She started taking allopurinol  100 mg and 2 months ago switched to 300 mg.  Cannot take colchicine  because of nausea.  Getting increased flares recently.  This has flares in the feet.  No fever or chills or nausea or vomiting.  Physical exam:  General appearance: Pleasant, and in no acute distress. AOx3.  Vascular: Pedal pulses: DP 2/4 bilaterally, PT 2/4 bilaterally. Mild edema lower legs bilaterally. Capillary fill time immediate bilaterally.  Neurological: Light touch intact feet bilaterally.  Normal Achilles reflex bilaterally.  No clonus or spasticity noted.   Dermatologic:   Erythema over the lateral right foot over the fifth metatarsal base.  Skin normal temperature bilaterally.  Skin normal color, tone, and texture bilaterally.   Musculoskeletal: Tenderness over the base of the fifth metatarsal at the insertion of the peroneal tendons some tenderness on the peroneal tendon about 3 cm proximally.  Tenderness with eversion against resistance on the right.  +5+5 muscle strength.  Some tenderness over the plantar lateral aspect of the knee bilaterally  Radiographs: 3 views foot right: No evidence of fractures or dislocations.  There is a somewhat irregular area of the base of the fifth metatarsal.  Normal bone density.  No bone tumors.  No periosteal proliferation or evidence of osteomyelitis.  Tailor's bunion formed with increased fourth intermetatarsal angle  3 views foot left: No evidence of fractures or dislocations.  Normal bone density.  No bone tumors.  No evidence of osteomyelitis.  Hammertoe deformities  2 through 5.  Tailor's bunion deformity with increased fourth fifth met tarsal angle.  Diagnosis: 1.  Pain feet bilaterally 2.   Acute gout foot. 3.  Peroneal tendinitis right. 4.  Tailor's bunion deformities bilaterally  Plan: -New patient office visit level 3 for evaluation and management.  Modifier 25. -I discussed with her gout.  She has been having multiple flares recently.  She was on allopurinol  100 for several months and then about 2 months ago she had the increase it to 300 mg.  She cannot take colchicine  because it causes her nausea.  Is not uncommon when starting or increasing the dosage of allopurinol  that is a number of gout flares.  Told her when things calm down she might want to try the colchicine  again to see if she still gets nausea back even if she takes it with food. -Rx prednisone  5 mg, 30 mg p.o. daily first day, then decrease by 5 mg every other day for 12 days -injected 3cc 2:1 mixture 0.5 cc Marcaine :Kenolog 10mg /60ml along peroneal tendon at insertion right.  -Ordered labs for uric acid   Return 1 week follow-up injection and gout

## 2024-05-04 LAB — URIC ACID: Uric Acid: 4.6 mg/dL (ref 3.1–7.9)

## 2024-05-17 ENCOUNTER — Ambulatory Visit: Admitting: Podiatry

## 2024-05-17 ENCOUNTER — Encounter: Payer: Self-pay | Admitting: Podiatry

## 2024-05-17 DIAGNOSIS — M7671 Peroneal tendinitis, right leg: Secondary | ICD-10-CM

## 2024-05-17 NOTE — Progress Notes (Signed)
  Patient presents with complaint of pain over the lateral foot from an acute gout flare.  Injected and had prednisone  last visit.  She says doing better although still sore toes pain is much more focal now.  She can at least walk comfortably now without too much discomfort although by the end of the day it started getting sore again.  Physical exam:  General appearance: Pleasant, and in no acute distress. AOx3.  Vascular: Pedal pulses: DP 2/4 bilaterally, PT 2/4 bilaterally. Mlld edema lower legs bilaterally.  Decreased edema to lateral foot and over fifth metatarsal base right.  Capillary fill time immediate bilaterally.  Neurological: Light touch intact feet bilaterally.  Normal Achilles reflex bilaterally.  Negative Tinel sign sural nerve right.  Dermatologic:   Decreased redness over lateral foot.  Has an area about the size of a nickel over the fifth metatarsal base insertion of the peroneal tendon that is still red..   Musculoskeletal: The base of the fifth metatarsal and along the distal 2 cm of the peroneus brevis tendon.  No tenderness at the TMT's.  No tenderness in the sinus tarsi.    Diagnosis: 1.  Peroneus brevis tendinitis right.-Resolving.  Plan: -Established office visit for evaluation management level 3. - Patient is improving symptomatically though still still having some soreness.  She is wearing a dress shoe today so I recommend wearing a good stable athletic shoe with good arch support and cushioning.  She is okay walking around she is not having any tremendous pain anymore.  She could benefit from a AFO however the strap would hit right where her area of maximal tenderness is so we will forego this for now. -Continue icing or heat as needed. - Recommend Voltaren gel 3-4 times a day over area of tenderness lateral foot right -Continue indomethacin  as needed.  Return 4 weeks follow-up gout

## 2024-06-14 ENCOUNTER — Ambulatory Visit: Admitting: Podiatry

## 2024-06-14 ENCOUNTER — Encounter: Payer: Self-pay | Admitting: Podiatry

## 2024-06-14 DIAGNOSIS — M7671 Peroneal tendinitis, right leg: Secondary | ICD-10-CM

## 2024-06-14 NOTE — Progress Notes (Signed)
 Today follow-up peroneal tendinitis right foot.  Doing much better.  Minimal soreness at this point.  Can walk fine without any trouble.   Physical exam:  General appearance: Pleasant, and in no acute distress. AOx3.  Vascular: Pedal pulses: DP 2/4 bilaterally, PT 2/4 bilaterally. Mild edema lower legs bilaterally. Capillary fill time immedaite b/l.SABRA  Neurological: Ossey intact bilaterally  Dermatologic:   Skin normal temperature bilaterally.  Skin normal color, tone, and texture bilaterally.   Musculoskeletal: Minimal soreness at base of fifth metatarsal at the insertion peroneus brevis.  No tenderness at the fourth fifth met cuboid joint work with palpation or range of motion right.  Diagnosis: 1.  Peroneal tendinitis right  Plan: -Established office visit for evaluation and management level 3. - I discussed over the peroneal tendinitis and possible acute gout flare.  [employer] to keep an eye on this and see what happens if she gets another flare or not discussed with her versus just localized inflammation.  If it does is little bit sore she can ice it, use Voltaren gel as needed, and/or use NSAIDs -Wear good stable supportive shoes  Return as needed

## 2024-06-26 ENCOUNTER — Other Ambulatory Visit: Payer: Self-pay

## 2024-06-26 ENCOUNTER — Other Ambulatory Visit: Payer: Self-pay | Admitting: Internal Medicine

## 2024-08-08 ENCOUNTER — Other Ambulatory Visit: Payer: Self-pay

## 2024-08-08 ENCOUNTER — Other Ambulatory Visit: Payer: Self-pay | Admitting: Internal Medicine

## 2024-08-08 DIAGNOSIS — R5383 Other fatigue: Secondary | ICD-10-CM

## 2024-08-24 ENCOUNTER — Other Ambulatory Visit: Payer: Self-pay | Admitting: Internal Medicine

## 2024-08-24 ENCOUNTER — Other Ambulatory Visit: Payer: Self-pay

## 2024-10-05 ENCOUNTER — Ambulatory Visit: Payer: Self-pay | Admitting: Internal Medicine

## 2024-10-05 ENCOUNTER — Encounter: Payer: Self-pay | Admitting: Internal Medicine

## 2024-10-05 ENCOUNTER — Ambulatory Visit: Admitting: Internal Medicine

## 2024-10-05 VITALS — BP 122/80 | HR 102 | Temp 97.6°F | Ht 63.0 in | Wt 160.0 lb

## 2024-10-05 DIAGNOSIS — E039 Hypothyroidism, unspecified: Secondary | ICD-10-CM

## 2024-10-05 DIAGNOSIS — R739 Hyperglycemia, unspecified: Secondary | ICD-10-CM

## 2024-10-05 DIAGNOSIS — R051 Acute cough: Secondary | ICD-10-CM

## 2024-10-05 DIAGNOSIS — E785 Hyperlipidemia, unspecified: Secondary | ICD-10-CM

## 2024-10-05 DIAGNOSIS — K2900 Acute gastritis without bleeding: Secondary | ICD-10-CM

## 2024-10-05 DIAGNOSIS — N1831 Chronic kidney disease, stage 3a: Secondary | ICD-10-CM | POA: Diagnosis not present

## 2024-10-05 DIAGNOSIS — Z0000 Encounter for general adult medical examination without abnormal findings: Secondary | ICD-10-CM | POA: Diagnosis not present

## 2024-10-05 DIAGNOSIS — R5383 Other fatigue: Secondary | ICD-10-CM

## 2024-10-05 DIAGNOSIS — J4520 Mild intermittent asthma, uncomplicated: Secondary | ICD-10-CM | POA: Diagnosis not present

## 2024-10-05 DIAGNOSIS — M5431 Sciatica, right side: Secondary | ICD-10-CM | POA: Diagnosis not present

## 2024-10-05 DIAGNOSIS — K2970 Gastritis, unspecified, without bleeding: Secondary | ICD-10-CM | POA: Insufficient documentation

## 2024-10-05 LAB — LIPID PANEL
Cholesterol: 183 mg/dL (ref 28–200)
HDL: 62.9 mg/dL
LDL Cholesterol: 71 mg/dL (ref 10–99)
NonHDL: 120.59
Total CHOL/HDL Ratio: 3
Triglycerides: 248 mg/dL — ABNORMAL HIGH (ref 10.0–149.0)
VLDL: 49.6 mg/dL — ABNORMAL HIGH (ref 0.0–40.0)

## 2024-10-05 LAB — CBC WITH DIFFERENTIAL/PLATELET
Basophils Absolute: 0.1 K/uL (ref 0.0–0.1)
Basophils Relative: 1.1 % (ref 0.0–3.0)
Eosinophils Absolute: 0.3 K/uL (ref 0.0–0.7)
Eosinophils Relative: 3.1 % (ref 0.0–5.0)
HCT: 40.2 % (ref 36.0–46.0)
Hemoglobin: 13.2 g/dL (ref 12.0–15.0)
Lymphocytes Relative: 23.8 % (ref 12.0–46.0)
Lymphs Abs: 2 K/uL (ref 0.7–4.0)
MCHC: 32.9 g/dL (ref 30.0–36.0)
MCV: 88.3 fl (ref 78.0–100.0)
Monocytes Absolute: 0.8 K/uL (ref 0.1–1.0)
Monocytes Relative: 9.2 % (ref 3.0–12.0)
Neutro Abs: 5.4 K/uL (ref 1.4–7.7)
Neutrophils Relative %: 62.8 % (ref 43.0–77.0)
Platelets: 446 K/uL — ABNORMAL HIGH (ref 150.0–400.0)
RBC: 4.55 Mil/uL (ref 3.87–5.11)
RDW: 16.8 % — ABNORMAL HIGH (ref 11.5–15.5)
WBC: 8.5 K/uL (ref 4.0–10.5)

## 2024-10-05 LAB — HEPATIC FUNCTION PANEL
ALT: 30 U/L (ref 3–35)
AST: 31 U/L (ref 5–37)
Albumin: 4.5 g/dL (ref 3.5–5.2)
Alkaline Phosphatase: 63 U/L (ref 39–117)
Bilirubin, Direct: 0.1 mg/dL (ref 0.1–0.3)
Total Bilirubin: 0.4 mg/dL (ref 0.2–1.2)
Total Protein: 7.3 g/dL (ref 6.0–8.3)

## 2024-10-05 LAB — URIC ACID: Uric Acid, Serum: 4.9 mg/dL (ref 2.4–7.0)

## 2024-10-05 LAB — TSH: TSH: 5.78 u[IU]/mL — ABNORMAL HIGH (ref 0.35–5.50)

## 2024-10-05 LAB — BASIC METABOLIC PANEL WITH GFR
BUN: 17 mg/dL (ref 6–23)
CO2: 31 meq/L (ref 19–32)
Calcium: 12.2 mg/dL — ABNORMAL HIGH (ref 8.4–10.5)
Chloride: 100 meq/L (ref 96–112)
Creatinine, Ser: 1.14 mg/dL (ref 0.40–1.20)
GFR: 46.96 mL/min — ABNORMAL LOW
Glucose, Bld: 110 mg/dL — ABNORMAL HIGH (ref 70–99)
Potassium: 3.9 meq/L (ref 3.5–5.1)
Sodium: 140 meq/L (ref 135–145)

## 2024-10-05 LAB — HEMOGLOBIN A1C: Hgb A1c MFr Bld: 6.3 % (ref 4.6–6.5)

## 2024-10-05 MED ORDER — SUCRALFATE 1 G PO TABS: 1.0000 g | ORAL_TABLET | Freq: Four times a day (QID) | ORAL | 0 refills | Status: AC

## 2024-10-05 MED ORDER — COLCHICINE 0.6 MG PO TABS: ORAL_TABLET | ORAL | 5 refills | Status: AC

## 2024-10-05 MED ORDER — PANTOPRAZOLE SODIUM 40 MG PO TBEC: 40.0000 mg | DELAYED_RELEASE_TABLET | Freq: Every day | ORAL | 3 refills | Status: AC

## 2024-10-05 MED ORDER — TRAMADOL HCL 50 MG PO TABS: 50.0000 mg | ORAL_TABLET | Freq: Four times a day (QID) | ORAL | 1 refills | Status: AC | PRN

## 2024-10-05 MED ORDER — CYCLOBENZAPRINE HCL 10 MG PO TABS: 10.0000 mg | ORAL_TABLET | Freq: Every day | ORAL | 1 refills | Status: AC | PRN

## 2024-10-05 MED ORDER — DULERA 200-5 MCG/ACT IN AERO: 2.0000 | INHALATION_SPRAY | Freq: Two times a day (BID) | RESPIRATORY_TRACT | 11 refills | Status: AC

## 2024-10-05 MED ORDER — LEVALBUTEROL TARTRATE 45 MCG/ACT IN AERO: 1.0000 | INHALATION_SPRAY | Freq: Four times a day (QID) | RESPIRATORY_TRACT | 11 refills | Status: AC | PRN

## 2024-10-05 MED ORDER — ALLOPURINOL 300 MG PO TABS: 300.0000 mg | ORAL_TABLET | Freq: Every day | ORAL | 3 refills | Status: AC

## 2024-10-05 MED ORDER — ZOLPIDEM TARTRATE 10 MG PO TABS: 10.0000 mg | ORAL_TABLET | Freq: Every day | ORAL | 1 refills | Status: AC

## 2024-10-05 NOTE — Assessment & Plan Note (Signed)
 Please take all new medication as prescribed - the carafate  1 qid x 1 mo

## 2024-10-05 NOTE — Assessment & Plan Note (Signed)
 Lab Results  Component Value Date   CREATININE 1.14 10/05/2024   Stable overall, cont to avoid nephrotoxins ckd3a

## 2024-10-05 NOTE — Progress Notes (Signed)
 Patient ID: [name] [name], female   DOB: 22-Aug-1951, 74 y.o.   MRN: [id number]         Chief Complaint:: wellness exam and ckd3a, hld, hyperglycemia, gastritis       HPI:  [name] [name] is a 74 y.o. female here for wellness exam; o/w up to date               Also Pt denies chest pain, increased sob or doe, wheezing, orthopnea, PND, increased LE swelling, palpitations, dizziness or syncope.   Pt denies polydipsia, polyuria, or new focal neuro s/s.   Has mild epigastric discomfort some helped with tums but persistent.  Pt continues to have recurring right LBP without change in severity, bowel or bladder change, fever, wt loss,  worsening LE pain/numbness/weakness, gait change or falls.     Wt Readings from Last 3 Encounters:  10/05/24 160 lb (72.6 kg)  05/03/24 155 lb (70.3 kg)  04/04/24 155 lb 6.4 oz (70.5 kg)   BP Readings from Last 3 Encounters:  10/05/24 122/80  04/04/24 108/68  02/11/24 122/76   Immunization History  Administered Date(s) Administered   Influenza Whole 09/14/2006   PFIZER(Purple Top)SARS-COV-2 Vaccination 06/04/2020   Td 09/15/2007  There are no preventive care reminders to display for this patient.    Past Medical History:  Diagnosis Date   ASTHMA    BREAST CANCER, HX OF 11/1979   CKD (chronic kidney disease) stage 3, GFR 30-59 ml/min (HCC) 03/05/2020   GERD    GI bleed 11/92, 11/93, 11/94   GI Bleed   Irritable bowel syndrome    MIGRAINE HEADACHE    Symptomatic anemia 08/24/2018   Past Surgical History:  Procedure Laterality Date   ABDOMINAL HYSTERECTOMY  11/1979   BREAST SURGERY     ESOPHAGOGASTRODUODENOSCOPY N/A 08/26/2018   Procedure: ESOPHAGOGASTRODUODENOSCOPY (EGD);  Surgeon: Legrand Victory LITTIE DOUGLAS, MD;  Location: [facility] ENDOSCOPY;  Service: Gastroenterology;  Laterality: N/A;   Left wrist  1963   MASTECTOMY  11/1979   Bilaterally & implants   OTHER SURGICAL HISTORY Bilateral    removal of breast implants   TONSILLECTOMY  1955   TUBAL LIGATION  1975     reports that she has never smoked. She has never used smokeless tobacco. She reports current alcohol use. She reports that she does not use drugs. family history includes Breast cancer in her mother; Colon polyps (age of onset: 34) in her sister; Coronary artery disease in her mother; Coronary artery disease (age of onset: 57) in her father; Endometriosis in her sister; Heart disease in her brother; Hyperlipidemia in her brother; Hypertension in her brother; Irritable bowel syndrome in her sister; Uterine cancer in her mother. Allergies[1] Medications Ordered Prior to Encounter[2]      ROS:  All others reviewed and negative.  Objective        PE:  BP 122/80 (BP Location: Left Arm, Patient Position: Sitting, Cuff Size: Normal)   Pulse (!) 102   Temp 97.6 F (36.4 C) (Oral)   Ht 5' 3 (1.6 m)   Wt 160 lb (72.6 kg)   SpO2 93%   BMI 28.34 kg/m                 Constitutional: Pt appears in NAD               HENT: Head: NCAT.                Right Ear: External ear normal.  Left Ear: External ear normal.                Eyes: . Pupils are equal, round, and reactive to light. Conjunctivae and EOM are normal               Nose: without d/c or deformity               Neck: Neck supple. Gross normal ROM               Cardiovascular: Normal rate and regular rhythm.                 Pulmonary/Chest: Effort normal and breath sounds without rales or wheezing.                Abd:  Soft, mild epigastric tender, ND, + BS, no organomegaly               Neurological: Pt is alert. At baseline orientation, motor grossly intact               Skin: Skin is warm. No rashes, no other new lesions, LE edema - none               Psychiatric: Pt behavior is normal without agitation   Micro: none  Cardiac tracings I have personally interpreted today:  none  Pertinent Radiological findings (summarize): none   Lab Results  Component Value Date   WBC 8.5 10/05/2024   HGB 13.2 10/05/2024    HCT 40.2 10/05/2024   PLT 446.0 (H) 10/05/2024   GLUCOSE 110 (H) 10/05/2024   CHOL 183 10/05/2024   TRIG 248.0 (H) 10/05/2024   HDL 62.90 10/05/2024   LDLDIRECT 80.0 04/06/2023   LDLCALC 71 10/05/2024   ALT 30 10/05/2024   AST 31 10/05/2024   NA 140 10/05/2024   K 3.9 10/05/2024   CL 100 10/05/2024   CREATININE 1.14 10/05/2024   BUN 17 10/05/2024   CO2 31 10/05/2024   TSH 5.78 (H) 10/05/2024   INR 1.0 08/24/2018   HGBA1C 6.3 10/05/2024   Assessment/Plan:  [name] [name] is a 74 y.o. White or Caucasian [1] female with  has a past medical history of ASTHMA, BREAST CANCER, HX OF (11/1979), CKD (chronic kidney disease) stage 3, GFR 30-59 ml/min (HCC) (03/05/2020), GERD, GI bleed (11/92, 11/93, 11/94), Irritable bowel syndrome, MIGRAINE HEADACHE, and Symptomatic anemia (08/24/2018).  Preventative health care Age and sex appropriate education and counseling updated with regular exercise and diet Referrals for preventative services - none needed Immunizations addressed - none needed Smoking counseling  - none needed Evidence for depression or other mood disorder - none significant Most recent labs reviewed. I have personally reviewed and have noted: 1) the patient's medical and social history 2) The patient's current medications and supplements 3) The patient's height, weight, and BMI have been recorded in the chart   CKD (chronic kidney disease) stage 3, GFR 30-59 ml/min (HCC) Lab Results  Component Value Date   CREATININE 1.14 10/05/2024   Stable overall, cont to avoid nephrotoxins ckd3a  Dyslipidemia Lab Results  Component Value Date   LDLCALC 71 10/05/2024   Stable, pt to continue low chol diet   Hyperglycemia Lab Results  Component Value Date   HGBA1C 6.3 10/05/2024   Stable, pt to continue current medical treatment  - diet, wt control   Hypothyroidism Lab Results  Component Value Date   TSH 5.78 (H) 10/05/2024   With minor elevation c/w subclinical,  asympt - ok to follow    Right sided sciatica For tramadol  50 mg qid prn refill,  to f/u any worsening symptoms or concerns  Gastritis Please take all new medication as prescribed - the carafate  1 qid x 1 mo  Followup: No follow-ups on file.  [name] Rush, MD 10/05/2024 8:31 PM [facility]     [1]  Allergies Allergen Reactions   Albuterol Anaphylaxis    Pt states she has tolerated levalbuterol    Albuterol Sulfate Anaphylaxis    Pt states she has tolerated levalbuterol    Amoxicillin-Pot Clavulanate Anaphylaxis    Lips swell    Broccoli [Brassica Oleracea] Anaphylaxis   Covid-19 (Mrna) Vaccine (Pfizer) [Covid-19 (Mrna) Vaccine] Anaphylaxis   Drug Ingredient [Acetylcysteine] Shortness Of Breath   Epipen  [Epinephrine ] Other (See Comments)    Allergic to preservatives in Epipen  with caution!!!   Fluzone Anaphylaxis   Influenza Vaccines Anaphylaxis   Morphine Anaphylaxis   Penicillin G Anaphylaxis    All cillins All cillins   Povidone-Iodine Anaphylaxis and Hives   Sulfonamide Derivatives Anaphylaxis   Atrovent [Ipratropium] Other (See Comments)    Has sulfa in preservative   Ciprofloxacin Itching    Itchy palms   Hyoscyamine  Sulfate Hives   Latex Itching   Meperidine Hcl Nausea And Vomiting    REACTION: N\T\V   Other Other (See Comments) and Swelling    Mucomist-unable to breathe Arm swelling with red streaks   Pneumococcal Vaccines Other (See Comments)    Pt declines   Ppd [Tuberculin Purified Protein Derivative] Swelling and Other (See Comments)    Arm swelling with red streaks   Reglan [Metoclopramide] Other (See Comments)   Zostavax [Zoster Vaccine Live] Other (See Comments)    Pt declines   Pepcid [Famotidine] Nausea And Vomiting, Palpitations and Other (See Comments)    Due to sulfide preservative  [2]  Current Outpatient Medications on File Prior to Visit  Medication Sig Dispense Refill    Ascorbic Acid  (VITAMIN C ) 1000 MG tablet Take 1,000 mg by mouth daily.     aspirin  EC 81 MG tablet Take 1 tablet (81 mg total) by mouth daily. Swallow whole.     aspirin -acetaminophen -caffeine (EXCEDRIN MIGRAINE) 250-250-65 MG per tablet Take 1 tablet by mouth every 6 (six) hours as needed. For migraine     Cyanocobalamin  (VITAMIN B 12 PO) Take 1 tablet by mouth daily.     diphenhydrAMINE  (BENADRYL ) 25 MG tablet Take 1 tablet (25 mg total) by mouth every 6 (six) hours as needed for up to 5 days. 20 tablet 0   EPINEPHrine  0.3 mg/0.3 mL IJ SOAJ injection As directed for severe allergic reaction 2 each 12   indomethacin  (INDOCIN ) 50 MG capsule Take 1 capsule (50 mg total) by mouth 3 (three) times daily with meals. 90 capsule 3   levalbuterol  (XOPENEX ) 1.25 MG/3ML nebulizer solution Take 1.25 mg by nebulization every 4 (four) hours as needed for wheezing. 72 mL 12   Melatonin 10 MG CAPS Take 10 mg by mouth at bedtime as needed (sleep).      triamcinolone  ointment (KENALOG ) 0.1 % apply topically 2 TIMES DAILY 30 g 0   vitamin E  400 UNIT capsule Take 400 Units by mouth daily.     No current facility-administered medications on file prior to visit.

## 2024-10-05 NOTE — Assessment & Plan Note (Signed)
 Lab Results  Component Value Date   LDLCALC 71 10/05/2024   Stable, pt to continue low chol diet

## 2024-10-05 NOTE — Assessment & Plan Note (Signed)
 Lab Results  Component Value Date   HGBA1C 6.3 10/05/2024   Stable, pt to continue current medical treatment  - diet, wt control

## 2024-10-05 NOTE — Assessment & Plan Note (Signed)
 For tramadol  50 mg qid prn refill,  to f/u any worsening symptoms or concerns

## 2024-10-05 NOTE — Progress Notes (Signed)
 The test results show that your current treatment is OK, as the tests are stable.  Please continue the same plan.  There is no other need for change of treatment or further evaluation based on these results, at this time.  thanks

## 2024-10-05 NOTE — Assessment & Plan Note (Signed)

## 2024-10-05 NOTE — Assessment & Plan Note (Signed)
 Lab Results  Component Value Date   TSH 5.78 (H) 10/05/2024   With minor elevation c/w subclinical, asympt - ok to follow

## 2024-10-05 NOTE — Patient Instructions (Signed)
 Please continue all other medications as before, and refills have been done for the tramadol   Please take all new medication as prescribed - the carafate  for 1 month only  Please have the pharmacy call with any other refills you may need.  Please continue your efforts at being more active, low cholesterol diet, and weight control.  You are otherwise up to date with prevention measures today.  Please keep your appointments with your specialists as you may have planned  Please go to the LAB at the blood drawing area for the tests to be done  You will be contacted by phone if any changes need to be made immediately.  Otherwise, you will receive a letter about your results with an explanation, but please check with MyChart first.  Please make an Appointment to return in 6 months, or sooner if needed

## 2025-01-17 ENCOUNTER — Ambulatory Visit

## 2025-03-28 ENCOUNTER — Ambulatory Visit: Admitting: Family Medicine
